# Patient Record
Sex: Female | Born: 1960 | Race: White | Hispanic: No | Marital: Married | Smoking: Former smoker
Health system: Southern US, Community
[De-identification: ages and names within clinical notes are randomized; demographics above are authoritative.]

## PROBLEM LIST (undated history)

## (undated) DIAGNOSIS — R7303 Prediabetes: Secondary | ICD-10-CM

## (undated) DIAGNOSIS — Z9889 Other specified postprocedural states: Secondary | ICD-10-CM

## (undated) DIAGNOSIS — R112 Nausea with vomiting, unspecified: Secondary | ICD-10-CM

## (undated) DIAGNOSIS — D649 Anemia, unspecified: Secondary | ICD-10-CM

## (undated) DIAGNOSIS — M1990 Unspecified osteoarthritis, unspecified site: Secondary | ICD-10-CM

## (undated) DIAGNOSIS — E785 Hyperlipidemia, unspecified: Secondary | ICD-10-CM

## (undated) DIAGNOSIS — T8859XA Other complications of anesthesia, initial encounter: Secondary | ICD-10-CM

## (undated) DIAGNOSIS — M5136 Other intervertebral disc degeneration, lumbar region: Secondary | ICD-10-CM

## (undated) DIAGNOSIS — T4145XA Adverse effect of unspecified anesthetic, initial encounter: Secondary | ICD-10-CM

## (undated) DIAGNOSIS — I10 Essential (primary) hypertension: Secondary | ICD-10-CM

## (undated) DIAGNOSIS — F419 Anxiety disorder, unspecified: Secondary | ICD-10-CM

## (undated) DIAGNOSIS — E039 Hypothyroidism, unspecified: Secondary | ICD-10-CM

## (undated) DIAGNOSIS — Z8489 Family history of other specified conditions: Secondary | ICD-10-CM

## (undated) DIAGNOSIS — R0789 Other chest pain: Secondary | ICD-10-CM

## (undated) DIAGNOSIS — R011 Cardiac murmur, unspecified: Secondary | ICD-10-CM

## (undated) DIAGNOSIS — R519 Headache, unspecified: Secondary | ICD-10-CM

## (undated) DIAGNOSIS — F32A Depression, unspecified: Secondary | ICD-10-CM

## (undated) DIAGNOSIS — I340 Nonrheumatic mitral (valve) insufficiency: Secondary | ICD-10-CM

## (undated) DIAGNOSIS — E669 Obesity, unspecified: Secondary | ICD-10-CM

## (undated) DIAGNOSIS — M19049 Primary osteoarthritis, unspecified hand: Secondary | ICD-10-CM

## (undated) DIAGNOSIS — E119 Type 2 diabetes mellitus without complications: Secondary | ICD-10-CM

## (undated) DIAGNOSIS — K219 Gastro-esophageal reflux disease without esophagitis: Secondary | ICD-10-CM

## (undated) DIAGNOSIS — G4733 Obstructive sleep apnea (adult) (pediatric): Secondary | ICD-10-CM

## (undated) DIAGNOSIS — M51369 Other intervertebral disc degeneration, lumbar region without mention of lumbar back pain or lower extremity pain: Secondary | ICD-10-CM

## (undated) DIAGNOSIS — M797 Fibromyalgia: Secondary | ICD-10-CM

## (undated) DIAGNOSIS — T7840XA Allergy, unspecified, initial encounter: Secondary | ICD-10-CM

## (undated) DIAGNOSIS — E559 Vitamin D deficiency, unspecified: Secondary | ICD-10-CM

## (undated) HISTORY — DX: Cardiac murmur, unspecified: R01.1

## (undated) HISTORY — DX: Unspecified osteoarthritis, unspecified site: M19.90

## (undated) HISTORY — DX: Hyperlipidemia, unspecified: E78.5

## (undated) HISTORY — DX: Other intervertebral disc degeneration, lumbar region: M51.36

## (undated) HISTORY — PX: JOINT REPLACEMENT: SHX530

## (undated) HISTORY — PX: REPLACEMENT TOTAL KNEE: SUR1224

## (undated) HISTORY — DX: Hypothyroidism, unspecified: E03.9

## (undated) HISTORY — PX: OTHER SURGICAL HISTORY: SHX169

## (undated) HISTORY — DX: Allergy, unspecified, initial encounter: T78.40XA

## (undated) HISTORY — DX: Fibromyalgia: M79.7

## (undated) HISTORY — DX: Gastro-esophageal reflux disease without esophagitis: K21.9

## (undated) HISTORY — DX: Other chest pain: R07.89

## (undated) HISTORY — DX: Nonrheumatic mitral (valve) insufficiency: I34.0

---

## 1982-12-04 HISTORY — PX: LAPAROSCOPY: SHX197

## 1998-07-01 ENCOUNTER — Ambulatory Visit (HOSPITAL_COMMUNITY): Admission: RE | Admit: 1998-07-01 | Discharge: 1998-07-01 | Payer: Self-pay | Admitting: *Deleted

## 2004-02-02 ENCOUNTER — Other Ambulatory Visit: Admission: RE | Admit: 2004-02-02 | Discharge: 2004-02-02 | Payer: Self-pay | Admitting: Obstetrics and Gynecology

## 2004-02-10 ENCOUNTER — Inpatient Hospital Stay (HOSPITAL_COMMUNITY): Admission: AD | Admit: 2004-02-10 | Discharge: 2004-02-10 | Payer: Self-pay | Admitting: *Deleted

## 2004-02-12 ENCOUNTER — Ambulatory Visit (HOSPITAL_COMMUNITY): Admission: RE | Admit: 2004-02-12 | Discharge: 2004-02-12 | Payer: Self-pay | Admitting: Obstetrics & Gynecology

## 2004-02-12 ENCOUNTER — Encounter (INDEPENDENT_AMBULATORY_CARE_PROVIDER_SITE_OTHER): Payer: Self-pay | Admitting: *Deleted

## 2005-12-04 HISTORY — PX: OTHER SURGICAL HISTORY: SHX169

## 2007-08-01 ENCOUNTER — Ambulatory Visit (HOSPITAL_COMMUNITY): Admission: RE | Admit: 2007-08-01 | Discharge: 2007-08-01 | Payer: Self-pay | Admitting: Obstetrics & Gynecology

## 2008-03-02 ENCOUNTER — Ambulatory Visit: Payer: Self-pay | Admitting: Family Medicine

## 2008-08-20 ENCOUNTER — Ambulatory Visit: Payer: Self-pay | Admitting: Family Medicine

## 2008-12-21 ENCOUNTER — Ambulatory Visit: Payer: Self-pay

## 2009-02-25 ENCOUNTER — Encounter: Payer: Self-pay | Admitting: Cardiology

## 2009-02-26 ENCOUNTER — Encounter: Payer: Self-pay | Admitting: Cardiovascular Disease

## 2009-03-16 ENCOUNTER — Ambulatory Visit: Payer: Self-pay | Admitting: Family Medicine

## 2009-03-23 ENCOUNTER — Ambulatory Visit: Payer: Self-pay | Admitting: Family Medicine

## 2009-06-03 HISTORY — PX: KNEE SURGERY: SHX244

## 2009-06-17 ENCOUNTER — Ambulatory Visit: Payer: Self-pay | Admitting: General Practice

## 2009-06-23 ENCOUNTER — Ambulatory Visit: Payer: Self-pay | Admitting: General Practice

## 2009-12-04 HISTORY — PX: COLONOSCOPY: SHX174

## 2009-12-31 ENCOUNTER — Ambulatory Visit: Payer: Self-pay | Admitting: Cardiology

## 2009-12-31 DIAGNOSIS — R079 Chest pain, unspecified: Secondary | ICD-10-CM | POA: Insufficient documentation

## 2009-12-31 DIAGNOSIS — E785 Hyperlipidemia, unspecified: Secondary | ICD-10-CM | POA: Insufficient documentation

## 2009-12-31 DIAGNOSIS — R0989 Other specified symptoms and signs involving the circulatory and respiratory systems: Secondary | ICD-10-CM | POA: Insufficient documentation

## 2009-12-31 DIAGNOSIS — R0609 Other forms of dyspnea: Secondary | ICD-10-CM | POA: Insufficient documentation

## 2009-12-31 DIAGNOSIS — R011 Cardiac murmur, unspecified: Secondary | ICD-10-CM | POA: Insufficient documentation

## 2010-01-03 ENCOUNTER — Ambulatory Visit: Payer: Self-pay | Admitting: Cardiology

## 2010-01-03 ENCOUNTER — Ambulatory Visit: Payer: Self-pay

## 2010-01-03 ENCOUNTER — Ambulatory Visit (HOSPITAL_COMMUNITY): Admission: RE | Admit: 2010-01-03 | Discharge: 2010-01-03 | Payer: Self-pay | Admitting: Cardiology

## 2010-01-03 ENCOUNTER — Encounter (HOSPITAL_COMMUNITY): Admission: RE | Admit: 2010-01-03 | Discharge: 2010-03-09 | Payer: Self-pay | Admitting: Cardiology

## 2010-01-03 ENCOUNTER — Encounter: Payer: Self-pay | Admitting: Cardiology

## 2010-01-07 ENCOUNTER — Ambulatory Visit: Payer: Self-pay | Admitting: Cardiovascular Disease

## 2010-01-10 ENCOUNTER — Encounter: Payer: Self-pay | Admitting: Cardiovascular Disease

## 2010-01-12 ENCOUNTER — Telehealth: Payer: Self-pay | Admitting: Cardiovascular Disease

## 2010-01-14 ENCOUNTER — Telehealth: Payer: Self-pay | Admitting: Cardiovascular Disease

## 2010-02-02 ENCOUNTER — Ambulatory Visit: Payer: Self-pay | Admitting: Emergency Medicine

## 2010-02-21 ENCOUNTER — Telehealth (INDEPENDENT_AMBULATORY_CARE_PROVIDER_SITE_OTHER): Payer: Self-pay | Admitting: *Deleted

## 2010-02-24 ENCOUNTER — Ambulatory Visit: Payer: Self-pay | Admitting: Emergency Medicine

## 2010-02-24 DIAGNOSIS — J309 Allergic rhinitis, unspecified: Secondary | ICD-10-CM | POA: Insufficient documentation

## 2010-03-17 ENCOUNTER — Ambulatory Visit: Payer: Self-pay | Admitting: Family Medicine

## 2010-08-01 ENCOUNTER — Ambulatory Visit: Payer: Self-pay | Admitting: Gastroenterology

## 2010-08-03 ENCOUNTER — Ambulatory Visit: Payer: Self-pay | Admitting: Urology

## 2010-08-03 LAB — PATHOLOGY REPORT

## 2010-12-04 DIAGNOSIS — M797 Fibromyalgia: Secondary | ICD-10-CM

## 2010-12-04 HISTORY — DX: Fibromyalgia: M79.7

## 2010-12-06 ENCOUNTER — Encounter: Payer: Self-pay | Admitting: Rheumatology

## 2010-12-24 ENCOUNTER — Encounter: Payer: Self-pay | Admitting: Neurology

## 2010-12-25 ENCOUNTER — Encounter: Payer: Self-pay | Admitting: Obstetrics and Gynecology

## 2011-01-01 LAB — CONVERTED CEMR LAB
AST: 19 units/L
Albumin: 4.2 g/dL
BUN: 15 mg/dL
Calcium: 9 mg/dL
Cholesterol: 165 mg/dL
GFR calc non Af Amer: >59 mL/min
Glucose, Bld: 83 mg/dL
Potassium: 4.7 meq/L
Sodium: 140 meq/L

## 2011-01-03 NOTE — Assessment & Plan Note (Signed)
Summary: allergies, ? asthma   Visit Type:  Follow-up Copy to:  Dr. Mariah Milling Primary Provider/Referring Provider:  Liane Comber FNP  CC:  Asthma follow-up with PFT.  Patient has called earlier in the week c/o chest pain after being around too much cigarette smoke.Marland Kitchen  History of Present Illness: 50 yo former smoker (30pk-yrs) with hx ? asthma dx yrs ago (has had PFT's, ? when or where) and allergic rhinitis. Also with hx GERD.   Referred by cardiology for some episodic SOB and CP that she believes has some relationship to reflux or possibly to fumes that she is exposed to at work. Started mid-Feb when she had CP and pressure, evolving neck pain. Associated with dyspnea. Prompted a cardiac eval, TTE, ECG, cardiac stress test that was reassuring. Has been treated with prednisone in the past, including with the most recent episode. Didn't seem to help this time, but has helped in the past. Has been on Advair before during allergy season. Also given xopenex. Now referred to evaluate possible asthma.   ROV 02/24/10 -- Returns for f/u. Has been having some mid-CP that can be associated with exertion. has had some problems with allergies, worse with the pollen this Spring.   Current Medications (verified): 1)  Levothroid 112 Mcg Tabs (Levothyroxine Sodium) .Marland Kitchen.. 1 By Mouth Once Daily 2)  Xopenex Hfa 45 Mcg/act Aero (Levalbuterol Tartrate) .... 2 Puffs in The Morning and 2 Puffs in The Evening As Needed 3)  Omeprazole 40 Mg Cpdr (Omeprazole) .Marland Kitchen.. 1 Tab By Mouth Daily 4)  Meloxicam 7.5 Mg Tabs (Meloxicam) .... As Needed  Allergies (verified): No Known Drug Allergies  Vital Signs:  Patient profile:   50 year old female Height:      67 inches (170.18 cm) Weight:      208 pounds (94.55 kg) BMI:     32.70 O2 Sat:      95 % on Room air Temp:     97.4 degrees F (36.33 degrees C) oral Pulse rate:   99 / minute BP sitting:   118 / 72  (left arm) Cuff size:   regular  Vitals Entered By: Michel Bickers CMA  (February 24, 2010 1:47 PM)  O2 Sat at Rest %:  95 O2 Flow:  Room air  Physical Exam  General:  normal appearance and healthy appearing.   Head:  normocephalic and atraumatic Eyes:  conjunctiva and sclera clear Nose:  no deformity, discharge, inflammation, or lesions Mouth:  no deformity or lesions Neck:  no masses, thyromegaly, or abnormal cervical nodes Lungs:  clear bilaterally to auscultation and percussion Heart:  regular rate and rhythm, S1, S2 without murmurs, rubs, gallops, or clicks Abdomen:  not examined Neurologic:  non-focal Psych:  alert and cooperative; normal mood and affect; normal attention span and concentration   Pulmonary Function Test Date: 02/24/2010 Height (in.): 67 Gender: Female  Pre-Spirometry FVC    Value: 3.82 L/min   Pred: 3.68 L/min     % Pred: 104 % FEV1    Value: 2.77 L     Pred: 2.81 L     % Pred: 99 % FEV1/FVC  Value: 73 %     Pred: 75 %    FEF 25-75  Value: 2.07 L/min   Pred: 3.10 L/min     % Pred: 67 %  Post-Spirometry FVC    Value: 3.91 L/min   Pred: 3.68 L/min     % Pred: 106 % FEV1    Value: 2.91 L  Pred: 2.81 L     % Pred: 104 % FEV1/FVC  Value: 74 %     Pred: 75 %    FEF 25-75  Value: 2.45 L/min   Pred: 3.10 L/min     % Pred: 79 %  Lung Volumes TLC    Value: 5.94 L   % Pred: 106 % RV    Value: 2.12 L   % Pred: 107 % DLCO    Value: 22.4 %   % Pred: 85 % DLCO/VA  Value: 5.23 %   % Pred: 133 %  Comments: Normal airflows, no response to BD. Normal volumes. Normal diffusion capacity. RSB  Impression & Recommendations:  Problem # 1:  ? of ASTHMA (ICD-493.90) Normal PFT. Could consider methacholine challenge if we decide to pursue further. Will defer at this time  Problem # 2:  ALLERGIC RHINITIS (ICD-477.9)  Will ramp up allergy regime in prep for Spring: - NSWs - veramyst - loratadine Her updated medication list for this problem includes:    Veramyst 27.5 Mcg/spray Susp (Fluticasone furoate) .Marland Kitchen... 2 sprays each nostril once  daily    Loratadine 10 Mg Tabs (Loratadine) .Marland Kitchen... 1 by mouth once daily  Orders: Est. Patient Level IV (69629)  Medications Added to Medication List This Visit: 1)  Veramyst 27.5 Mcg/spray Susp (Fluticasone furoate) .... 2 sprays each nostril once daily 2)  Loratadine 10 Mg Tabs (Loratadine) .Marland Kitchen.. 1 by mouth once daily  Patient Instructions: 1)  Start loratadine 10mg  by mouth once daily  2)  Start Veramyst 2 sprays each nostril once daily  3)  Try doing nasal saline washes every day.  4)  We will consider performing a methacholine challenge test at some point in the future if your breathing changes.  5)  Continue you omeprazole two times a day  6)  Follow up with Dr Delton Coombes in 6 weeks to assess your progress.  Prescriptions: LORATADINE 10 MG TABS (LORATADINE) 1 by mouth once daily  #30 x 5   Entered and Authorized by:   Leslye Peer MD   Signed by:   Leslye Peer MD on 02/24/2010   Method used:   Electronically to        [facility]* (retail)       [address]       [place], [place]  [address]       Ph: [phone]       Fax: [phone]   RxID:   [id number] VERAMYST 27.5 MCG/SPRAY SUSP (FLUTICASONE FUROATE) 2 sprays each nostril once daily  #1 x 5   Entered and Authorized by:   Leslye Peer MD   Signed by:   Leslye Peer MD on 02/24/2010   Method used:   Electronically to        [facility]* (retail)       [address]       [place], [place]  [address]       Ph: [phone]       Fax: [phone]   RxID:   [id number]

## 2011-01-03 NOTE — Assessment & Plan Note (Signed)
Summary: EC6/AMD   Visit Type:  EC6 Referring Provider:  Liane Comber Primary Provider:  Liane Comber FNP  CC:  SOB and back pain from breathing.  History of Present Illness: 50 yo with history of moderate mitral regurgitation presents for evaluation of chest pain and dyspnea.   Ms. states that her shortness of breath has continued. She feels tight in the chest, feels like she has to cough something up. She states that it feels very similar to when she injured her lungs and she inhaled something on the job. At that time she needed steroids to improve her shortness of breath. She has tried an albuterol inhaler in the past though states that it made her feel very jittery. She ended up throwing away.  Stress test showed no significant ischemia. Echocardiogram showed mild to moderate mitral regurgitation with normal systolic function. Otherwise normal echocardiogram.     Current Medications (verified): 1)  Omeprazole 40 Mg Cpdr (Omeprazole) .Marland Kitchen.. 1 By Mouth Once Daily 2)  Levothroid 112 Mcg Tabs (Levothyroxine Sodium) .Marland Kitchen.. 1 By Mouth Once Daily  Allergies (verified): No Known Drug Allergies  Past History:  Past Medical History: Last updated: 01/06/10 1. Asthma: mild, associated with seasonal allergies 2. G E R D 3. Hyperlipidemia 4. Hypothyroidism post radioactive iodine  5. Knee surgery 7/10 6. Moderate mitral regurgitation (probably MVP).  Echo (3/10) at [facility] cardiology with normal EF, moderate MR.  7. metrorrhagia 8. 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  Alcohol Use - yes -  social (rare) Regular Exercise - no Drug Use - no  Risk Factors: Alcohol Use: <1 (01-06-10) Caffeine Use: 3 cups a day (2010-01-06) Exercise: no (01-06-10)  Risk Factors: Smoking Status: quit (2010-01-06)  Review of Systems       The patient complains of dyspnea on exertion and prolonged cough.  The patient denies anorexia, fever, weight loss, weight gain, vision loss, decreased hearing, hoarseness, chest pain, syncope, peripheral edema, headaches, hemoptysis, abdominal pain, melena, hematochezia, severe indigestion/heartburn, hematuria, incontinence, genital sores, muscle weakness, suspicious skin lesions, transient blindness, difficulty walking, depression, unusual weight change, abnormal bleeding, enlarged lymph nodes, angioedema, breast masses, and testicular masses.    Vital Signs:  Patient profile:   50 year old female Height:      67 inches Weight:      204.50 pounds BMI:     32.15 Pulse rate:   64 / minute Pulse rhythm:   regular BP sitting:   120 / 80  (left arm) Cuff size:   regular  Vitals Entered By: Mercer Pod (January 07, 2010 10:04 AM)  Physical Exam  General:  well-appearing young woman in no apparent distress. Her HEENT exam is benign, oropharynx is clear, neck is supple with no JVP or carotid bruits, heart sounds are regular with normal S1 and S2 with 1-2/6 systolic ejection murmur heard at the axilla, lungs are clear to auscultation with no wheezes or rales, abdominal exam is benign, she has no significant lower extremity edema, neurological exam is grossly nonfocal and skin is warm and dry.  Impression & Recommendations:  Problem # 1:  DYSPNEA ON EXERTION (ICD-786.09) Etiology of her shortness of breath is likely due to a pulmonary cause. Her stress test and echocardiogram are essentially normal apart from mild to moderate mitral  regurgitation which is chronic. She does comment on having a history of asthma, a 20+ year smoking history. She has had this in the past for shortness of breath. She has been unable to tolerate albuterol q.2 h. history feelings.  We have given her a prescription for Xopenex inhaler, if this does not improve her shortness of breath we have also given her a prescription for a prednisone Dosepak. I also suggested that she double her omeprazole and take it on a consistent basis as she states having some GERD type symptoms. I've asked her to contact either myself or bronchogenic practice if her shortness of breath continues.  Problem # 2:  HYPERLIPIDEMIA (ICD-272.4) we'll try to obtain the most recent cholesterol panel from [facility]. She states that she is due to followup with Dr. Katrinka Blazing in April of this year.  Problem # 3:  CHEST PAIN UNSPECIFIED (ICD-786.50) I suspect her chest pain is 2 to pulmonary etiology, with possible inflammation. I hope that this improves on prednisone and have  asked her to contact us if she has no improvement.  Patient Instructions: 1)  Your physician recommends that you schedule a follow-up appointment in: 6 months 2)  Your physician has recommended you make the following change in your medication: xopenex inhaler 2 puffs in the morning and 2 puffs in the evening, prednisone dose pack Prescriptions: OMEPRAZOLE 40 MG CPDR (OMEPRAZOLE) 1 by mouth twice daily  #60 x 6   Entered by:   Charlena Cross, RN, BSN   Authorized by:   Dossie Arbour MD   Signed by:   Charlena Cross, RN, BSN on 01/07/2010   Method used:   Electronically to        [facility]* (retail)       [address]       [place], [place]  [address]       Ph: [phone]       Fax: [phone]   RxID:   [id number] PREDNISONE 10 MG TABS (PREDNISONE) prednisone taper: 60mg  x3 days, 40 mg x3 day, 20 mg x 3days, 10mg  x3days then stop.  #1 x 0   Entered by:    Charlena Cross, RN, BSN   Authorized by:   Dossie Arbour MD   Signed by:   Charlena Cross, RN, BSN on 01/07/2010   Method used:   Electronically to        [facility]* (retail)       [address]       [place], [place]  [address]       Ph: [phone]       Fax: [phone]   RxID:   [id number] OMEPRAZOLE 40 MG CPDR (OMEPRAZOLE) 1 tab by mouth daily  #30 x 6   Entered by:   Charlena Cross, RN, BSN   Authorized by:   Dossie Arbour MD   Signed by:   Charlena Cross, RN, BSN on 01/07/2010   Method used:   Electronically to        [facility]* (retail)       [address] [facility]       [place], [place]  [address]       Ph: [phone]       Fax: [phone]   RxID:   [id number] QIWLNLG HFA 45 MCG/ACT AERO (LEVALBUTEROL TARTRATE) 2 puffs in the morning and 2 puffs in the evening  #1 x 6   Entered by:   Charlena Cross, RN, BSN   Authorized by:   Dossie Arbour MD   Signed by:   Charlena Cross, RN, BSN on 01/07/2010   Method used:   Electronically to        [facility]* (retail)       [address]       [place], [place]  [address]       Ph: [phone]       Fax: [phone]   RxID:   [id number]   Appended Document: EC6/AMD    Clinical Lists Changes  Observations: Added new observation of TSH: 0.094 microintl units/mL (03/29/2009 10:47) Added new observation of TRIGLYCERIDE: 105 mg/dL (87/86/7672 09:47) Added new observation of HDL: 37 mg/dL (09/62/8366 29:47) Added new observation of LDL (CALCUL): 107 mg/dL (65/46/5035 46:56) Added new observation of CHOLESTEROL: 165 mg/dL (81/27/5170 01:74) Added new observation of T4, FREE: 1.98 ng/dL (94/49/6759 16:38) Added new observation of ALBUMIN: 4.2 g/dL (46/65/9935 70:17) Added new observation of PROTEIN, TOT: 6.7 g/dL (79/39/0300 92:33) Added new observation of CALCIUM: 9.0 mg/dL (00/76/2263 33:54) Added new observation of BILI  TOTAL: 0.3 mg/dL (56/25/6389 37:34) Added new observation of SGPT (ALT): 22 units/L (02/26/2009 10:48) Added new observation of SGOT (AST): 19 units/L (02/26/2009 10:48) Added new observation of CO2 PLSM/SER: 23 meq/L (02/26/2009 10:48) Added new observation of CL SERUM: 101 meq/L (02/26/2009 10:48) Added new observation of K SERUM: 4.7 meq/L (02/26/2009 10:48) Added new observation of NA: 140 meq/L (02/26/2009 10:48) Added new observation of CREATININE: 0.73 mg/dL (28/76/8115 72:62) Added new observation of BUN: 15 mg/dL (03/55/9741 63:84) Added new observation of BG RANDOM: 83 mg/dL (53/64/6803 21:22) Added new observation of BASOPHIL %: 0 % (02/26/2009 10:47) Added new observation of % EOS AUTO: 3 % (02/26/2009 10:47) Added new observation of MONOCYTE %: 8 % (02/26/2009 10:47) Added new observation of LYMPH %: 21 % (02/26/2009 10:47) Added new observation of PMN %: 68 % (02/26/2009 10:47) Added new observation of RDW: 13.6 % (02/26/2009 10:47) Added new observation of MCV: 89 fL (02/26/2009 10:47) Added new observation of PLATELETK/UL: 278 K/uL (02/26/2009 10:47) Added new observation of RBC M/UL: 4.63 M/uL (02/26/2009 10:47) Added new observation of HCT: 41.4 % (02/26/2009 10:47) Added new observation of HGB: 13.6 g/dL (48/25/0037 04:88) Added new observation of WBC COUNT: 5.8 10*3/microliter (02/26/2009 10:47)       -  Date:  03/29/2009    Cholesterol: 165    LDL-calculated: 107    HDL: 37    Triglycerides: 891    TSH: 0.094  Date:  02/26/2009    WBC: 5.8    HGB: 13.6    HCT: 41.4    RBC: 4.63    PLT: 278    MCV: 89    RDW: 13.6    Neutrophil: 68    Lymphs: 21    Monos: 8    Eos: 3    Basophil: 0    BG Random: 83    BUN: 15    Creatinine: 0.73    Sodium: 140    Potassium: 4.7    Chloride: 101    CO2 Total: 23    SGOT (AST): 19  SGPT (ALT): 22    T. Bilirubin: 0.3    Calcium: 9.0    Total Protein: 6.7    Albumin: 4.2    T4-Free: 1.98

## 2011-01-03 NOTE — Miscellaneous (Signed)
  Clinical Lists Changes  Observations: Added new observation of TRIGLYCERIDE: 105 mg/dL (13/07/6577 46:96) Added new observation of HDL: 37 mg/dL (29/52/8413 24:40) Added new observation of LDL: 107 mg/dL (09/30/2535 64:40) Added new observation of GFR AA: >59 mL/min/1.90m2 (02/26/2009 11:56) Added new observation of GFR: >59 mL/min (02/26/2009 11:56) Added new observation of ALBUMIN: 4.2 g/dL (34/74/2595 63:87) Added new observation of PROTEIN, TOT: 6.7 g/dL (56/43/3295 18:84) Added new observation of CHOLESTEROL: 165 mg/dL (16/60/6301 60:10) Added new observation of CALCIUM: 9.0 mg/dL (93/23/5573 22:02) Added new observation of ALK PHOS: 66 units/L (02/26/2009 11:56) Added new observation of BILI TOTAL: 0.3 mg/dL (54/27/0623 76:28) Added new observation of SGPT (ALT): 22 units/L (02/26/2009 11:56) Added new observation of SGOT (AST): 19 units/L (02/26/2009 11:56) Added new observation of CO2 PLSM/SER: 23 meq/L (02/26/2009 11:56) Added new observation of CL SERUM: 101 meq/L (02/26/2009 11:56) Added new observation of K SERUM: 4.7 meq/L (02/26/2009 11:56) Added new observation of NA: 140 meq/L (02/26/2009 11:56) Added new observation of CREATININE: 0.73 mg/dL (31/51/7616 07:37) Added new observation of BUN: 15 mg/dL (10/62/6948 54:62) Added new observation of BG RANDOM: 83 mg/dL (70/35/0093 81:82)

## 2011-01-03 NOTE — Progress Notes (Signed)
Summary: SOB not better  Phone Note Call from Patient   Summary of Call: called to state that SOB is no better on inhaler and prednisone.  Per Dr. Mariah Milling- quick taper off prednisone and try lasix 20mg  daily for 1 week to see if symtpoms improve. Instructed pt on need to increase potassium rich foods.  Initial call taken by: Charlena Cross, RN, BSN,  January 12, 2010 2:31 PM    New/Updated Medications: FUROSEMIDE 20 MG TABS (FUROSEMIDE) Take one tablet by mouth daily for 1 week Prescriptions: FUROSEMIDE 20 MG TABS (FUROSEMIDE) Take one tablet by mouth daily for 1 week  #30 x 0   Entered by:   Charlena Cross, RN, BSN   Authorized by:   Dossie Arbour MD   Signed by:   Charlena Cross, RN, BSN on 01/12/2010   Method used:   Electronically to        [facility]* (retail)       [address]       [place], [place]  [address]       Ph: [phone]       Fax: [phone]   RxID:   [id number]

## 2011-01-03 NOTE — Progress Notes (Signed)
Summary: pain in left breast when exhaling  Phone Note Call from Patient   Caller: Patient Call For: byrum Summary of Call: pt c/o "stabbing pain in ribs/ breast area L side x 1 day; painful when exhaling per pt. says she was exposed to some smokers inside a bldg sat night. pt says this is not an emergency situation- she is at work. has appt for pft thurs. please advise. cell [phone] Initial call taken by: Tivis Ringer, CNA,  February 21, 2010 8:43 AM  Follow-up for Phone Call        The patient c/o a stabbing pain x1 day in her rib area but did not feel she needed to come in for OV with one of our providers. She is sch to come for OV w/ RB and PFT on Thurs. 3/24/20111. She will call if she wants OV before then.Michel Bickers CMA  February 21, 2010 9:26 AM

## 2011-01-03 NOTE — Assessment & Plan Note (Signed)
Summary: Cardiology Nuclear Study  Nuclear Med Background Indications for Stress Test: Evaluation for Ischemia   History: Asthma, CT/MRI, Echo, GXT  History Comments: 15-20 Yrs. ago GXT: Ok per patient. 3/10 Echo: NL EF,Moderate MR([facility]).  Symptoms: Chest Pain, DOE  Symptoms Comments: 12/31/09 (-) Troponin. Recent (R) calf pain with (-) PE per CT.   Nuclear Pre-Procedure Cardiac Risk Factors: Family History - CAD, History of Smoking, Lipids Caffeine/Decaff Intake: None NPO After: 7:00 PM Lungs: clear IV 0.9% NS with Angio Cath: 22g     IV Site: (R) AC IV Started by: Irean Hong RN Chest Size (in) 38     Cup Size B     Height (in): 67 Weight (lb): 201 BMI: 31.59  Nuclear Med Study 1 or 2 day study:  1 day     Stress Test Type:  Stress Reading MD:  Willa Rough, MD     Referring MD:  D.Mclean Resting Radionuclide:  Technetium 30m Tetrofosmin     Resting Radionuclide Dose:  11.0 mCi  Stress Radionuclide:  Technetium 60m Tetrofosmin     Stress Radionuclide Dose:  33.0 mCi   Stress Protocol Exercise Time (min):  7:00 min     Max HR:  155 bpm     Predicted Max HR:  171 bpm  Max Systolic BP: 175 mm Hg     Percent Max HR:  90.64 %     METS: 8.7 Rate Pressure Product:  [address]    Stress Test Technologist:  Milana Na EMT-P     Nuclear Technologist:  Burna Mortimer Deal RT-N  Rest Procedure  Myocardial perfusion imaging was performed at rest 45 minutes following the intravenous administration of Myoview Technetium 65m Tetrofosmin.  Stress Procedure  The patient exercised for 7:00. The patient stopped due to fatigue and denied any chest pain.  There were non specific ST-T wave changes and a rare pvc.  Myoview was injected at peak exercise and myocardial perfusion imaging was performed after a brief delay.  QPS Raw Data Images:  Normal; no motion artifact; normal heart/lung ratio. Stress Images:  There is normal uptake in all areas. Rest Images:  Normal  homogeneous uptake in all areas of the myocardium. Subtraction (SDS):  No evidence of ischemia. Transient Ischemic Dilatation:  .98  (Normal <1.22)  Lung/Heart Ratio:  .28  (Normal <0.45)  Quantitative Gated Spect Images QGS EDV:  96 ml QGS ESV:  26 ml QGS EF:  73 % QGS cine images:  Normal motion  Findings Normal nuclear study      Overall Impression  Exercise Capacity: Fair exercise capacity. BP Response: Normal blood pressure response. Clinical Symptoms: No chest pain ECG Impression: No significant ST segment change suggestive of ischemia. Overall Impression: Normal stress nuclear study.  Appended Document: Cardiology Nuclear Study normal study  Appended Document: Cardiology Nuclear Study pt aware. Charlena Cross RN BSN

## 2011-01-03 NOTE — Miscellaneous (Signed)
Summary: Orders Update pft charges  Clinical Lists Changes  Orders: Added new Service order of Carbon Monoxide diffusing w/capacity (94720) - Signed Added new Service order of Lung Volumes (94240) - Signed Added new Service order of Spirometry (Pre & Post) (94060) - Signed 

## 2011-01-03 NOTE — Assessment & Plan Note (Signed)
Summary: NP6/AMD   Visit Type:  New Patient Referring Provider:  Liane Comber Primary Provider:  Liane Comber FNP  CC:  chest pain, wheezing, and difficulty breathing. Marland Kitchen  History of Present Illness: 50 yo with history of moderate mitral regurgitation presents for evaluation of chest pain and dyspnea.  Patient was in her usual state of health until yesterday morning when she developed a dull substernal chest pressure radiating to her neck.  It was not pleuritic.  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  It lasted for about 45 minutes to an hour.  In the early afternoon, she began to note significant shortness of breath after walking about 10 feet.  She was out of breath with any exertion.  The shortness of breath is still present now but she has had no further chest pain.  No PND or orthopnea last night.  No shortness of breath at rest.  No definite wheezing.  No cough, fever, or congestion.    She had knee surgery this past summer.  Yesterday, she developed a pain in the posterior lower leg just below the knee that she had surgery on.  This has resolved.   Oxygen saturation was 99% on room air at [facility] this morning.   ECG: NSR, normal  Labs: CK 128, troponin < 0.02, BNP 33  Chest CT angiogram: No PE.  No other intrathoracic abnormality  Preventive Screening-Counseling & Management  Alcohol-Tobacco     Alcohol drinks/day: <1     Smoking Status: quit     Year Quit: 1995  Caffeine-Diet-Exercise     Caffeine use/day: 3 cups a day     Does Patient Exercise: no      Drug Use:  no.    Current Medications (verified): 1)  Omeprazole 40 Mg Cpdr (Omeprazole) .Marland Kitchen.. 1 By Mouth Once Daily 2)  Levothroid 112 Mcg Tabs (Levothyroxine Sodium) .Marland Kitchen.. 1 By Mouth Once Daily  Allergies (verified): No Known Drug Allergies  Past History:  Past Medical History: 1. Asthma: mild, associated with seasonal allergies 2. G E R D 3. Hyperlipidemia 4. Hypothyroidism  post radioactive iodine  5. Knee surgery 7/10 6. Moderate mitral regurgitation (probably MVP).  Echo (3/10) at [facility] cardiology with normal EF, moderate MR.  7. metrorrhagia 8. 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  Alcohol Use - yes -  social (rare) Regular Exercise - no Drug Use - no Alcohol drinks/day:  <1 Smoking Status:  quit Caffeine use/day:  3 cups a day Does Patient Exercise:  no Drug Use:  no  Review of Systems       All systems reviewed and negative except as per HPI.   Vital Signs:  Patient profile:   50 year old female Height:      67 inches Weight:      204 pounds BMI:     32.07 Pulse rate:   66 / minute Pulse rhythm:   regular BP sitting:   118 / 80  (left arm) Cuff size:   regular  Vitals Entered By: Mercer Pod (December 31, 2009 10:06 AM)  Physical Exam  General:  Well developed, well nourished, in no acute distress. Head:  normocephalic and atraumatic Nose:  no deformity, discharge, inflammation, or lesions Mouth:  Teeth, gums and palate normal. Oral mucosa normal. Neck:  Neck supple, no JVD. No masses, thyromegaly or abnormal cervical nodes. Lungs:  Clear bilaterally to auscultation and percussion.  No wheezes. Heart:  Non-displaced PMI, chest non-tender; regular rate and rhythm, S1, S2 without rubs or gallops. Possible soft systolic click.  1/6 systolic murmur at the apex.  Carotid upstroke normal, no bruit. Pedals normal pulses. No edema, no varicosities. Abdomen:  Bowel sounds positive; abdomen soft and non-tender  without masses, organomegaly, or hernias noted. No hepatosplenomegaly. Msk:  Back normal, normal gait. Muscle strength and tone normal. Extremities:  No clubbing or cyanosis. Neurologic:  Alert and oriented x 3. Skin:  Intact without lesions or rashes. Psych:  Normal affect.   Impression & Recommendations:  Problem # 1:  CHEST PAIN UNSPECIFIED (ICD-786.50) Patient had an episode of about 1 hour of severe chest pain yesterday.  Her main coronary risk factor is family history of premature CAD (mother).  She has had significant dyspnea on exertion since that time.  Given her complaint of right calf pain, I was concerned enough for PE to order a CT angiogram of the chest.  This showed no PE and no other intrathoracic abnormality.  Her cardiac enzymes were negative today.  I am not sure of the etiology of her symptoms as there is nothing abnormal on exam or with her vitals.  I will have her do an ETT-myoview on Monday.  If symptoms worsen over the weekend she should go to the ER.   Problem # 2:  DYSPNEA ON EXERTION (ICD-786.09) I am not sure of the etiology of this.  She is not volume overloaded on exam.  ECG is normal.  BNP is normal.  She has a history of mitral regurgitation (moderate) on prior echo but she does not have a loud murmur suggestive of flail leaflet, etc.  I will get an echocardiogram to assess for worsening of MR.  Ischemic diastolic dysfunction is a possibitity though she does not have much in the way of risk factors.  As above, will get an ETT-myoview.   We will see her again next week.   Other Orders: T-BNP  (B Natriuretic Peptide) [phone]) T-CK MB Fract ([id number]) T- * Misc. Laboratory test [phone]) Radiology Referral (Radiology) Echocardiogram (Echo) Nuclear Stress Test (Nuc Stress Test)  Patient Instructions: 1)  Your physician recommends that you schedule a follow-up appointment in: next week with Dr. Mariah Milling 2)  Your physician has requested that you have an  echocardiogram.  Echocardiography is a painless test that uses sound waves to create images of your heart. It provides your doctor with information about the size and shape of your heart and how well your heart's chambers and valves are working.  This procedure takes approximately one hour. There are no restrictions for this procedure. 3)  Your physician has requested that you have an exercise stress myoview.  For further information please visit [email or web address]  Please follow instruction sheet, as given.

## 2011-01-03 NOTE — Progress Notes (Signed)
Summary: [facility]   Imported By: Harlon Flor 12/31/2009 12:47:20  _____________________________________________________________________  External Attachment:    Type:   Image     Comment:   External Document

## 2011-01-03 NOTE — Progress Notes (Signed)
Summary: PHI  PHI   Imported By: Harlon Flor 12/31/2009 12:48:20  _____________________________________________________________________  External Attachment:    Type:   Image     Comment:   External Document

## 2011-01-03 NOTE — Progress Notes (Signed)
Summary: [facility]   Imported By: Harlon Flor 12/31/2009 12:48:04  _____________________________________________________________________  External Attachment:    Type:   Image     Comment:   External Document

## 2011-01-03 NOTE — Assessment & Plan Note (Signed)
Summary: dyspnea, ? asthma   Visit Type:  Initial Consult Copy to:  Dr. Mariah Milling Primary Provider/Referring Provider:  Liane Comber FNP  CC:  PUlmonary Consult for SOB.  states she is having SOB at rest and with activity-onest x3wks.  .  History of Present Illness: 50 yo former smoker (30pk-yrs) with hx ? asthma dx yrs ago (has had PFT's, ? when or where) and allergic rhinitis. Also with hx GERD.   Referred by cardiology for some episodic SOB and CP that she believes has some relationship to reflux or possibly to fumes that she is exposed to at work. Started mid-Feb when she had CP and pressure, evolving neck pain. Associated with dyspnea. Prompted a cardiac eval, TTE, ECG, cardiac stress test that was reassuring. Has been treated with prednisone in the past, including with the most recent episode. Didn't seem to help this time, but has helped in the past. Has been on Advair before during allergy season. Also given xopenex. Now referred to evaluate possible asthma.   Current Medications (verified): 1)  Levothroid 112 Mcg Tabs (Levothyroxine Sodium) .Marland Kitchen.. 1 By Mouth Once Daily 2)  Xopenex Hfa 45 Mcg/act Aero (Levalbuterol Tartrate) .... 2 Puffs in The Morning and 2 Puffs in The Evening As Needed 3)  Omeprazole 40 Mg Cpdr (Omeprazole) .Marland Kitchen.. 1 Tab By Mouth Daily 4)  Meloxicam 7.5 Mg Tabs (Meloxicam) .... 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  2ppd x 53yrs Alcohol Use - yes -  social (rare) Regular Exercise - no Drug Use - no  Review of Systems       The patient complains of shortness of breath with activity, shortness of breath at rest, chest pain, acid heartburn, and indigestion.  The patient denies productive cough, non-productive cough, coughing up blood, irregular heartbeats, loss of appetite, weight change, abdominal pain, difficulty swallowing, sore throat, tooth/dental  problems, headaches, nasal congestion/difficulty breathing through nose, sneezing, itching, ear ache, anxiety, depression, hand/feet swelling, joint stiffness or pain, rash, change in color of mucus, and fever.    Vital Signs:  Patient profile:   50 year old female Height:      67 inches Weight:      207.25 pounds BMI:     32.58 O2 Sat:      98 % on Room air Temp:     98.2 degrees F oral Pulse rate:   70 / minute BP sitting:   134 / 80  (left arm) Cuff size:   regular  Vitals Entered By: Gweneth Dimitri RN (February 02, 2010 2:25 PM)  O2 Flow:  Room air CC: PUlmonary Consult for SOB.  states she is having SOB at rest and with activity-onest x3wks.   Comments Medications reviewed with patient Daytime contact number verified with patient. Gweneth Dimitri RN  February 02, 2010 2:25 PM    Physical Exam  General:  normal appearance and healthy appearing.   Head:  normocephalic and atraumatic Eyes:  conjunctiva and sclera clear   Echocardiogram  Procedure date:  01/03/2010  Findings:          Study Conclusions            - Left ventricle: The cavity size was normal. Wall thickness was       normal. The estimated ejection fraction was 60%. Wall motion was       normal; there were no regional wall  motion abnormalities.     - Aortic valve: Sclerosis without stenosis.     - Mitral valve: Mild thickening of the tips of the mitral leaflets.       There is mild/moderate MR.     - Right ventricle: The cavity size was mildly dilated. Systolic       function was mildly reduced.     Transthoracic echocardiography. M-mode, complete 2D, spectral     Doppler, and color Doppler. Height: Height: 170.2cm. Height: 67in.     Weight: Weight: 92.5kg. Weight: 203.6lb. Body mass index: BMI:     32kg/m 2. Body surface area: BSA: 2.54m 2. Blood pressure: 127/69.     Patient status: Outpatient. Location: [facility]  CT of Chest  Procedure date:  12/31/2009  Findings:      No PE. No infiltrates  or parenchymal disease  Impression & Recommendations:  Problem # 1:  ASTHMA (ICD-493.90) Episodic dyspnea. I suspect this is asthma, being exacerbated by GERD and also possible exposures to fumes at work.  - full PFTs to confirm AFL - will schedule maintanance regimen once AFL confirmed - will need to manage allergies in the Spring - consider ramping up GER Dregimen if asthman proves difficult to control.   Medications Added to Medication List This Visit: 1)  Xopenex Hfa 45 Mcg/act Aero (Levalbuterol tartrate) .... 2 puffs in the morning and 2 puffs in the evening as needed 2)  Meloxicam 7.5 Mg Tabs (Meloxicam) .... As needed  Other Orders: Consultation Level IV (84166)  Patient Instructions: 1)  We will perform full PFT's on the same day as your return visit 2)  Follow up with Dr Delton Coombes next available appointment 3)  We will discuss starting inhaled medications after we review your PFTs   Immunization History:  Influenza Immunization History:    Influenza:  historical (08/04/2009)

## 2011-01-03 NOTE — Progress Notes (Signed)
Summary: SOB  Phone Note Call from Patient   Summary of Call: pt called to state that SOB is no better on lasix.  pt feels like she may need pulmonology referral at this pt. Initial call taken by: Charlena Cross, RN, BSN,  January 14, 2010 2:20 PM    Needs pulmonary referral. Irving Copas?   Appended Document: SOB pt has appt set up with [facility] for 3/2 @ 2:30 with Dr. Delton Coombes. Charlena Cross RN BSN   Appended Document: Orders Update    Clinical Lists Changes  Orders: Added new Referral order of Pulmonary Referral (Pulmonary) - Signed

## 2011-01-04 ENCOUNTER — Encounter: Payer: Self-pay | Admitting: Rheumatology

## 2011-03-24 ENCOUNTER — Ambulatory Visit: Payer: Self-pay

## 2011-04-05 ENCOUNTER — Ambulatory Visit: Payer: Self-pay | Admitting: Unknown Physician Specialty

## 2011-04-13 ENCOUNTER — Ambulatory Visit: Payer: Self-pay | Admitting: Unknown Physician Specialty

## 2011-04-18 NOTE — Op Note (Signed)
NAMEMARLETA, [name] NO.:  [id number]   MEDICAL RECORD NO.:  [id number]          PATIENT TYPE:  AMB   LOCATION:  [facility]C                           FACILITY:  [facility]   PHYSICIAN:  Genia Del, M.D.DATE OF BIRTH:  [birth year]   DATE OF PROCEDURE:  08/01/2007  DATE OF DISCHARGE:                               OPERATIVE REPORT   PREOPERATIVE DIAGNOSES:  1. Menorrhagia.  2. Desire for sterilization.   POSTOPERATIVE DIAGNOSES:  1. Menorrhagia.  2. Desire for sterilization.  3. Mild left adnexal adhesions.   PROCEDURES:  1. Endometrial ablation with NovaSure.  2. Laparoscopy bilateral tubal sterilization by cauterization.   SURGEON:  Genia Del, MD   ANESTHESIOLOGIST:  Raul Del, MD   PROCEDURE:  Under general anesthesia with endotracheal intubation, the  patient is in lithotomy position.  She is prepped with Betadine on the  abdominal, suprapubic, vulvar and vaginal areas and draped as usual.  The bladder is catheterized.  We do a vaginal exam revealing a  retroverted uterus, normal volume and mobile, no adnexal mass.  We  insert the speculum in the vagina.  The anterior lip of the cervix is  grasped with a tenaculum.  The cervix measures 4 cm.  The intrauterine  cavity measures 4 cm.  We dilate the cervix at a Hegar #25 without  difficulty.  We then insert the NovaSure instrument in the intrauterine  cavity.  We lock it due to motions to have the intrauterine width.  We  then verify the integrity of the cavity, which is adequate.  We then  proceed with the endometrial ablation.  We use a power of 55 for 2  minutes.  The instrument is then removed and we go to abdominal time.  We reposition the patient.  We infiltrate the subcutaneous tissue with  Marcaine 0.25% plain at the infraumbilical area.  We make an incision  over 1.5 cm with the scalpel at that level.  We open the aponeurosis  with Mayo scissors under direct vision and we open the  parietal  peritoneum bluntly with a finger.  We put a pursestring stitch of Vicryl  0 at the aponeurosis.  We insert the Hasson and the laparoscopic at that  level.  We explore the abdominopelvic cavities.  The liver is normal to  inspection.  No pathology is seen in the abdomen.  We visualize a normal  uterus, the right and left tubes are normal in appearance and size, the  right and left ovaries are normal in appearance and size.  Mild  adhesions are present in the left adnexa.  We then use the bipolar clamp  to cauterize the right tube and then the left tube at about 2 cm from  the cornua over 2 cm in with and including the mesosalpinx on each side.  Hemostasis is adequate at all levels.  Pictures were taken before and  after the procedure.  We remove all instruments, evacuate the  CO2.  We attach the pursestring suture at the aponeurosis.  We close the  skin by a subcuticular stitch of Vicryl 4-0.  Hemostasis is adequate at  that level.  We also remove the vaginal instruments.  The estimated  blood loss was minimal.  No complications occurred, and the patient was  brought to recovery room in good, stable status.      Genia Del, M.D.  Electronically Signed     ML/MEDQ  D:  08/01/2007  T:  08/02/2007  Job:  [id number]

## 2011-04-21 NOTE — Op Note (Signed)
NAME:  Jane [name], Jane [name]                         ACCOUNT NO.:  [id number]   MEDICAL RECORD NO.:  [id number]                   PATIENT TYPE:  AMB   LOCATION:  SDC                                  FACILITY:  [facility]   PHYSICIAN:  Genia Del, M.D.             DATE OF BIRTH:  1961/03/23   DATE OF PROCEDURE:  02/12/2004  DATE OF DISCHARGE:                                 OPERATIVE REPORT   PREOPERATIVE DIAGNOSIS:  Nine plus weeks with missed abortion.   POSTOPERATIVE DIAGNOSIS:  Nine plus weeks with missed abortion.   INTERVENTION:  Dilatation and evacuation.   SURGEON:  Dr. Genia Del.   ANESTHESIOLOGIST:  Dr. Harvest Forest.   DESCRIPTION OF PROCEDURE:  Under MAC analgesia, the patient is in lithotomy  position.  She is prepped with Hibiclens on the suprapubic, vulvar, and  vaginal areas and draped as usual.  The vaginal exam reveals a retroverted  uterus about 8-[redacted] weeks gestation.  The cervix is long and closed.  No  bleeding.  No adnexal mass.  The speculum is introduced in the vagina.  The  paracervical block is done with lidocaine 1% plain, 20 mL total at 4 o'clock  and 8 o'clock.  The anterior lip of the cervix is grasped with a tenaculum,  and dilatation of the cervix is done with Hagar dilators up to #31 without  difficulty.  A #9 curved curette is used to suction the intrauterine cavity.  Products of conception are sent to pathology, corresponding to 8-[redacted] weeks  gestation.  We then use the sharp curette to systematically curettage the  intrauterine cavity.  The uterine sound is heard all over.  We then use the  suction curette again to empty the uterine cavity of any products of  conception or blood clots.  The uterus contracts well.  Hemostasis was  adequate.  We removed the tenaculum.  No bleeding is present at that level.  The speculum is also removed.  The estimated blood loss was less than 50 mL.  The patient is Rh positive.  No complication occurred, and the patient was  transferred to recovery room in good status.                                               Genia Del, M.D.    ML/MEDQ  D:  02/12/2004  T:  02/13/2004  Job:  [id number]

## 2011-05-05 ENCOUNTER — Other Ambulatory Visit: Payer: Self-pay | Admitting: Dermatology

## 2011-06-27 ENCOUNTER — Encounter: Payer: Self-pay | Admitting: Cardiovascular Disease

## 2011-07-05 ENCOUNTER — Ambulatory Visit: Payer: Self-pay | Admitting: Family Medicine

## 2011-09-06 ENCOUNTER — Other Ambulatory Visit: Payer: Self-pay | Admitting: Orthopedic Surgery

## 2011-09-06 DIAGNOSIS — M79671 Pain in right foot: Secondary | ICD-10-CM

## 2011-09-08 ENCOUNTER — Ambulatory Visit
Admission: RE | Admit: 2011-09-08 | Discharge: 2011-09-08 | Disposition: A | Discharge status: Home / Self Care | Payer: Federal, State, Local not specified - PPO | Source: Ambulatory Visit | Attending: Orthopedic Surgery | Admitting: Orthopedic Surgery

## 2011-09-08 DIAGNOSIS — M79671 Pain in right foot: Secondary | ICD-10-CM

## 2011-09-08 MED ORDER — METHYLPREDNISOLONE ACETATE 40 MG/ML INJ SUSP (RADIOLOG
120.0000 mg | Freq: Once | INTRAMUSCULAR | Status: AC
  Administered 2011-09-08: 120 mg via INTRA_ARTICULAR

## 2011-09-08 MED ORDER — IOHEXOL 180 MG/ML  SOLN
1.0000 mL | Freq: Once | INTRAMUSCULAR | Status: AC | PRN
  Administered 2011-09-08: 1 mL via INTRA_ARTICULAR

## 2011-09-15 LAB — CBC
HCT: 42.5
Hemoglobin: 14.6
MCHC: 34.5
Platelets: 301
RDW: 13.3

## 2011-09-15 LAB — PREGNANCY, URINE: Preg Test, Ur: NEGATIVE

## 2012-02-27 ENCOUNTER — Ambulatory Visit: Payer: Self-pay | Admitting: Family Medicine

## 2012-07-02 ENCOUNTER — Other Ambulatory Visit: Payer: Self-pay | Admitting: Orthopedic Surgery

## 2012-07-02 DIAGNOSIS — M19079 Primary osteoarthritis, unspecified ankle and foot: Secondary | ICD-10-CM

## 2012-07-03 ENCOUNTER — Ambulatory Visit
Admission: RE | Admit: 2012-07-03 | Discharge: 2012-07-03 | Disposition: A | Discharge status: Home / Self Care | Payer: Federal, State, Local not specified - PPO | Source: Ambulatory Visit | Attending: Orthopedic Surgery | Admitting: Orthopedic Surgery

## 2012-07-03 DIAGNOSIS — M19079 Primary osteoarthritis, unspecified ankle and foot: Secondary | ICD-10-CM

## 2012-07-03 MED ORDER — METHYLPREDNISOLONE ACETATE 40 MG/ML INJ SUSP (RADIOLOG
120.0000 mg | Freq: Once | INTRAMUSCULAR | Status: AC
  Administered 2012-07-03: 120 mg via INTRA_ARTICULAR

## 2012-07-03 MED ORDER — IOHEXOL 180 MG/ML  SOLN
1.0000 mL | Freq: Once | INTRAMUSCULAR | Status: AC | PRN
  Administered 2012-07-03: 1 mL via INTRA_ARTICULAR

## 2012-08-21 ENCOUNTER — Ambulatory Visit: Payer: Self-pay | Admitting: Family Medicine

## 2012-10-20 ENCOUNTER — Ambulatory Visit (INDEPENDENT_AMBULATORY_CARE_PROVIDER_SITE_OTHER): Payer: Federal, State, Local not specified - PPO | Admitting: Family Medicine

## 2012-10-20 VITALS — BP 125/76 | HR 70 | Temp 98.4°F | Resp 12 | Ht 67.0 in | Wt 203.0 lb

## 2012-10-20 DIAGNOSIS — IMO0002 Reserved for concepts with insufficient information to code with codable children: Secondary | ICD-10-CM

## 2012-10-20 DIAGNOSIS — Z0000 Encounter for general adult medical examination without abnormal findings: Secondary | ICD-10-CM

## 2012-10-20 DIAGNOSIS — M179 Osteoarthritis of knee, unspecified: Secondary | ICD-10-CM

## 2012-10-20 DIAGNOSIS — E890 Postprocedural hypothyroidism: Secondary | ICD-10-CM | POA: Insufficient documentation

## 2012-10-20 DIAGNOSIS — IMO0001 Reserved for inherently not codable concepts without codable children: Secondary | ICD-10-CM

## 2012-10-20 DIAGNOSIS — M1710 Unilateral primary osteoarthritis, unspecified knee: Secondary | ICD-10-CM

## 2012-10-20 DIAGNOSIS — M797 Fibromyalgia: Secondary | ICD-10-CM

## 2012-10-20 DIAGNOSIS — Z01419 Encounter for gynecological examination (general) (routine) without abnormal findings: Secondary | ICD-10-CM | POA: Insufficient documentation

## 2012-10-20 LAB — POCT UA - MICROSCOPIC ONLY
Crystals, Ur, HPF, POC: NEGATIVE
Mucus, UA: NEGATIVE
WBC, Ur, HPF, POC: NEGATIVE
Yeast, UA: NEGATIVE

## 2012-10-20 LAB — POCT CBC
Lymph, poc: 1.6 (ref 0.6–3.4)
MCH, POC: 27.6 pg (ref 27–31.2)
MCHC: 29.9 g/dL — AB (ref 31.8–35.4)
MCV: 92.1 fL (ref 80–97)
MID (cbc): 0.4 (ref 0–0.9)
MPV: 8.6 fL (ref 0–99.8)
POC LYMPH PERCENT: 26.7 %L (ref 10–50)
POC MID %: 6.7 %M (ref 0–12)
Platelet Count, POC: 362 10*3/uL (ref 142–424)
RDW, POC: 13.8 %
WBC: 5.9 10*3/uL (ref 4.6–10.2)

## 2012-10-20 LAB — COMPREHENSIVE METABOLIC PANEL
AST: 21 U/L (ref 0–37)
BUN: 15 mg/dL (ref 6–23)
Calcium: 9.5 mg/dL (ref 8.4–10.5)
Chloride: 104 mEq/L (ref 96–112)
Creat: 0.63 mg/dL (ref 0.50–1.10)
Total Bilirubin: 0.4 mg/dL (ref 0.3–1.2)

## 2012-10-20 LAB — TSH: TSH: 2.63 u[IU]/mL (ref 0.350–4.500)

## 2012-10-20 LAB — LIPID PANEL
Cholesterol: 214 mg/dL — ABNORMAL HIGH (ref 0–200)
Triglycerides: 122 mg/dL (ref <150)
VLDL: 24 mg/dL (ref 0–40)

## 2012-10-20 LAB — POCT URINALYSIS DIPSTICK
Ketones, UA: NEGATIVE
Protein, UA: NEGATIVE
Spec Grav, UA: 1.02
pH, UA: 6.5

## 2012-10-20 NOTE — Patient Instructions (Addendum)
1. Routine general medical examination at a health care facility  EKG 12-Lead, POCT CBC, POCT glycosylated hemoglobin (Hb A1C), POCT urinalysis dipstick, POCT UA - Microscopic Only, Vitamin D 25 hydroxy, Comprehensive metabolic panel, TSH, T4, free, Lipid panel, Vitamin B12, Pap IG and HPV (high risk) DNA detection

## 2012-10-20 NOTE — Assessment & Plan Note (Signed)
Anticipatory guidance provided --- weight loss, exercise.  Pap smear obtained.  Mammogram and colonoscopy UTD.  Clarify date of last TDAP.  Influenza vaccine UTD.  Obtain labs.

## 2012-10-20 NOTE — Assessment & Plan Note (Signed)
Pap smear obtained.  Mammogram UTD.  Reviewed current pap smear guidelines; pt desires pap smear.

## 2012-10-20 NOTE — Progress Notes (Signed)
[address]   [place], [place]  [address]   [phone]  Subjective:    Patient ID: Jane [name], female    DOB: 12-Jun-1961, 51 y.o.   MRN: [id number]  HPIThis 51 y.o. female presents for evaluation for CPE.  Last physical 06/2011.  Mammogram 07/2012.  Colonoscopy age 64; repeat 10 years.  TDAP unknown.  Flu vaccine 2013.  Pneumovax never.  Eye exam 2012; +glasses.  Dental exam 2013.    1.  L knee OA: scheduled for surgery on knee.  S/p Synvest injection x 2.  Followed up this week for third injection but did not receive due to allergic reaction to Synvest.  Total knee replacement by [facility].     Review of Systems  Constitutional: Negative for fever, chills, diaphoresis, activity change, appetite change, fatigue and unexpected weight change.  HENT: Negative for hearing loss, ear pain, nosebleeds, congestion, sore throat, facial swelling, rhinorrhea, sneezing, drooling, mouth sores, trouble swallowing, neck pain, neck stiffness, dental problem, voice change, postnasal drip, sinus pressure, tinnitus and ear discharge.   Eyes: Negative for photophobia, pain, discharge, redness, itching and visual disturbance.  Respiratory: Negative for apnea, cough, choking, chest tightness, shortness of breath, wheezing and stridor.   Cardiovascular: Negative for chest pain, palpitations and leg swelling.  Gastrointestinal: Negative for nausea, vomiting, abdominal pain, diarrhea, constipation, blood in stool, abdominal distention, anal bleeding and rectal pain.  Genitourinary: Negative for dysuria, urgency, frequency, hematuria, flank pain, decreased urine volume, vaginal bleeding, vaginal discharge, enuresis, difficulty urinating, genital sores, vaginal pain, pelvic pain and dyspareunia.  Musculoskeletal: Positive for myalgias and arthralgias. Negative for back pain, joint swelling and gait problem.  Skin: Negative for color change, pallor, rash and wound.  Neurological: Negative for dizziness, tremors,  seizures, syncope, facial asymmetry, speech difficulty, weakness, light-headedness, numbness and headaches.  Hematological: Negative for adenopathy. Does not bruise/bleed easily.  Psychiatric/Behavioral: Negative for suicidal ideas, hallucinations, behavioral problems, confusion, sleep disturbance, self-injury, dysphoric mood, decreased concentration and agitation. The patient is not nervous/anxious and is not hyperactive.         Past Medical History  Diagnosis Date  . GERD (gastroesophageal reflux disease)   . HLD (hyperlipidemia)   . MR (mitral regurgitation)     moderate; probably MVP. echo 3/10 at [facility] cardiology with normal EF, moderate MR   . Atypical chest pain     hx   . Arthritis     Knees, feet, elbows , neck  . Fibromyalgia 12/04/2010    [facility] in [place]; Dr. Gwenevere Ghazi; followed every six months.  . Asthma     mild; assoc with seasonal allergies; uses Albuterol once per year on average.  Marland Kitchen Heart murmur     Mitral valve Prolapse  . Hypothyroidism post radioactive iodine    Hyperthyroidism; now secondary hypothyroidism    Past Surgical History  Procedure Date  . Knee surgery 7/10  . 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 Inner thigh surgery   . Left foot surgery     for plantar fascitis  . Novascue uterine ablation 2007  . Colonoscopy 12/04/2009    normal; [facility]    Prior to Admission medications   Medication Sig Start Date End Date Taking? Authorizing Provider  DULoxetine (CYMBALTA) 60 MG capsule Take 120 mg by mouth daily.   Yes Historical Provider, MD  levothyroxine (SYNTHROID, LEVOTHROID) 112 MCG tablet Take 112 mcg by mouth daily.     Yes Historical Provider, MD  meloxicam [facility])  7.5 MG tablet Take 7.5 mg by mouth as needed.     Yes Historical Provider, MD  omeprazole (PRILOSEC) 20 MG capsule Take 20 mg by mouth daily.     Yes Historical Provider, MD  levalbuterol (XOPENEX HFA) 45 MCG/ACT inhaler Inhale 2 puffs into the lungs 2 (two)  times daily. 2 puffs in am and 2 puffs in pm     Historical Provider, MD  loratadine (CLARITIN) 10 MG tablet Take 10 mg by mouth daily.      Historical Provider, MD    Allergies  Allergen Reactions  . Celebrex (Celecoxib) Swelling    Developed Facial swelling     History   Social History  . Marital Status: Married    Spouse Name: N/A    Number of Children: N/A  . Years of Education: N/A   Occupational History  . Not on file.   Social History Main Topics  . Smoking status: Former Games developer  . Smokeless tobacco: Never Used     Comment: smoked 2ppd for 15 years; quit in 1995   . Alcohol Use: 0.0 oz/week    0 Glasses of wine per week     Comment: social, rare  . Drug Use: No  . Sexually Active: Yes    Birth Control/ Protection: Post-menopausal   Other Topics Concern  . Not on file   Social History Narrative   Marital status:  Married x 18 years; happily married no abuse, no children.   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    Does not get regular exercise.     Family History  Problem Relation Age of Onset  . Stroke Father   . COPD Father   . Hyperlipidemia Mother   . Heart disease Mother   . Hypertension Sister     Objective:   Physical Exam  Nursing note and vitals reviewed. Constitutional: She is oriented to person, place, and time. She appears well-developed and well-nourished. No distress.  HENT:  Head: Normocephalic and atraumatic.  Right Ear: External ear normal.  Left Ear: External ear normal.  Nose: Nose normal.  Mouth/Throat: Oropharynx is clear and moist.  Eyes: Conjunctivae normal and EOM are normal. Pupils are equal, round, and reactive to light.  Neck: Normal range of motion. Neck supple. No JVD present. No thyromegaly present.  Cardiovascular: Normal rate, regular rhythm and intact distal pulses.  Exam reveals no gallop and no friction rub.   Murmur heard.  Systolic murmur is present with a grade of 1/6    Pulmonary/Chest: Effort normal and breath sounds normal. No respiratory distress. She has no wheezes. She has no rales.  Abdominal: Soft. Bowel sounds are normal. She exhibits no distension and no mass. There is no tenderness. There is no rebound and no guarding. Hernia confirmed negative in the right inguinal area and confirmed negative in the left inguinal area.  Genitourinary: Vagina normal and uterus normal. No breast swelling, tenderness, discharge or bleeding. There is no rash, tenderness, lesion or injury on the right labia. There is no rash, tenderness, lesion or injury on the left labia. No vaginal discharge found.  Musculoskeletal:       Right shoulder: Normal.       Left shoulder: Normal.       Cervical back: She exhibits decreased range of motion.  Lymphadenopathy:    She has no cervical adenopathy.       Right: No inguinal adenopathy present.  Left: No inguinal adenopathy present.  Neurological: She is alert and oriented to person, place, and time. She has normal reflexes. No cranial nerve deficit. She exhibits normal muscle tone. Coordination normal.  Skin: She is not diaphoretic.       Sun related changes upper back; +40mm diameter hyperpigmented nevus without irregularity or color variation upper L back.  Psychiatric: She has a normal mood and affect. Her behavior is normal. Judgment and thought content normal.     EKG:  NSR; no changes. Results for orders placed in visit on 10/20/12  POCT CBC      Component Value Range   WBC 5.9  4.6 - 10.2 K/uL   Lymph, poc 1.6  0.6 - 3.4   POC LYMPH PERCENT 26.7  10 - 50 %L   MID (cbc) 0.4  0 - 0.9   POC MID % 6.7  0 - 12 %M   POC Granulocyte 3.9  2 - 6.9   Granulocyte percent 66.6  37 - 80 %G   RBC 4.97  4.04 - 5.48 M/uL   Hemoglobin 13.7  12.2 - 16.2 g/dL   HCT, POC 40.9  81.1 - 47.9 %   MCV 92.1  80 - 97 fL   MCH, POC 27.6  27 - 31.2 pg   MCHC 29.9 (*) 31.8 - 35.4 g/dL   RDW, POC 91.4     Platelet Count, POC 362  142 -  424 K/uL   MPV 8.6  0 - 99.8 fL  POCT GLYCOSYLATED HEMOGLOBIN (HGB A1C)      Component Value Range   Hemoglobin A1C 5.6    POCT URINALYSIS DIPSTICK      Component Value Range   Color, UA yellow     Clarity, UA clear     Glucose, UA neg     Bilirubin, UA neg     Ketones, UA neg     Spec Grav, UA 1.020     Blood, UA trace     pH, UA 6.5     Protein, UA neg     Urobilinogen, UA 0.2     Nitrite, UA neg     Leukocytes, UA Negative    POCT UA - MICROSCOPIC ONLY      Component Value Range   WBC, Ur, HPF, POC neg     RBC, urine, microscopic neg     Bacteria, U Microscopic neg     Mucus, UA neg     Epithelial cells, urine per micros 2-4     Crystals, Ur, HPF, POC neg     Casts, Ur, LPF, POC neg     Yeast, UA neg      Assessment & Plan:   1. Routine general medical examination at a health care facility  EKG 12-Lead, POCT CBC, POCT glycosylated hemoglobin (Hb A1C), POCT urinalysis dipstick, POCT UA - Microscopic Only, Vitamin D 25 hydroxy, Comprehensive metabolic panel, TSH, T4, free, Lipid panel, Vitamin B12, Pap IG and HPV (high risk) DNA detection

## 2012-10-22 LAB — PAP IG AND HPV HIGH-RISK

## 2012-12-09 ENCOUNTER — Ambulatory Visit: Payer: Self-pay | Admitting: General Practice

## 2012-12-09 LAB — CBC
HCT: 41 % (ref 35.0–47.0)
MCH: 28.8 pg (ref 26.0–34.0)
MCV: 86 fL (ref 80–100)
Platelet: 293 10*3/uL (ref 150–440)
RDW: 13.3 % (ref 11.5–14.5)
WBC: 5.5 10*3/uL (ref 3.6–11.0)

## 2012-12-09 LAB — BASIC METABOLIC PANEL
Anion Gap: 7 (ref 7–16)
Calcium, Total: 9 mg/dL (ref 8.5–10.1)
Chloride: 104 mmol/L (ref 98–107)
Co2: 28 mmol/L (ref 21–32)
Creatinine: 0.67 mg/dL (ref 0.60–1.30)
EGFR (African American): >60
Osmolality: 279 (ref 275–301)
Potassium: 4 mmol/L (ref 3.5–5.1)

## 2012-12-09 LAB — MRSA PCR SCREENING

## 2012-12-09 LAB — URINALYSIS, COMPLETE
Bacteria: NONE SEEN
Bilirubin,UR: NEGATIVE
Glucose,UR: NEGATIVE mg/dL (ref 0–75)
Ketone: NEGATIVE
Ph: 6 (ref 4.5–8.0)
Protein: NEGATIVE
RBC,UR: 3 /HPF (ref 0–5)
Squamous Epithelial: <1
WBC UR: NONE SEEN /HPF (ref 0–5)

## 2012-12-09 LAB — PROTIME-INR: INR: 0.9

## 2012-12-25 ENCOUNTER — Inpatient Hospital Stay: Payer: Self-pay | Admitting: General Practice

## 2012-12-26 LAB — HEMOGLOBIN: HGB: 11.7 g/dL — ABNORMAL LOW (ref 12.0–16.0)

## 2012-12-26 LAB — BASIC METABOLIC PANEL
Anion Gap: 8 (ref 7–16)
Chloride: 102 mmol/L (ref 98–107)
Co2: 27 mmol/L (ref 21–32)
Creatinine: 0.69 mg/dL (ref 0.60–1.30)
Glucose: 106 mg/dL — ABNORMAL HIGH (ref 65–99)
Osmolality: 275 (ref 275–301)
Potassium: 4 mmol/L (ref 3.5–5.1)
Sodium: 137 mmol/L (ref 136–145)

## 2012-12-27 LAB — BASIC METABOLIC PANEL
BUN: 11 mg/dL (ref 7–18)
Calcium, Total: 8.1 mg/dL — ABNORMAL LOW (ref 8.5–10.1)
Co2: 28 mmol/L (ref 21–32)
Creatinine: 0.61 mg/dL (ref 0.60–1.30)
EGFR (African American): >60
EGFR (Non-African Amer.): >60
Glucose: 85 mg/dL (ref 65–99)
Osmolality: 274 (ref 275–301)

## 2012-12-27 LAB — HEMOGLOBIN: HGB: 11.3 g/dL — ABNORMAL LOW (ref 12.0–16.0)

## 2013-02-17 ENCOUNTER — Encounter: Payer: Self-pay | Admitting: Family Medicine

## 2013-03-13 ENCOUNTER — Ambulatory Visit (INDEPENDENT_AMBULATORY_CARE_PROVIDER_SITE_OTHER): Payer: Federal, State, Local not specified - PPO | Admitting: Emergency Medicine

## 2013-03-13 VITALS — BP 150/88 | HR 100 | Temp 98.0°F | Resp 18 | Ht 67.0 in | Wt 209.0 lb

## 2013-03-13 DIAGNOSIS — J309 Allergic rhinitis, unspecified: Secondary | ICD-10-CM

## 2013-03-13 MED ORDER — DESLORATADINE 5 MG PO TABS: 5.0000 mg | ORAL_TABLET | Freq: Every day | ORAL | Status: DC

## 2013-03-13 MED ORDER — ALBUTEROL SULFATE HFA 108 (90 BASE) MCG/ACT IN AERS: 2.0000 | INHALATION_SPRAY | RESPIRATORY_TRACT | Status: DC | PRN

## 2013-03-13 NOTE — Progress Notes (Signed)
[facility] [address] [place] [address] [phone]  Date:  03/13/2013   Name:  Jane [name]   DOB:  Sep 10, 1961   MRN:  [id number]  PCP:  Nilda Simmer, MD    Chief Complaint: Shortness of Breath, Headache and Insomnia   History of Present Illness:  Jane [name] is a 52 y.o. very pleasant female patient who presents with the following:  Has seasonal allergies and has four day history of shortness of breath.  Short of breath with exertion.  No peripheral edema.  No chest pain or nausea or vomiting.  No wheezing.  No cough or coryza.  No fever or chills.  Says this time of year she has wheezing and it improves after a short round of albuterol MDI.  No improvement with over the counter medications or other home remedies. Denies insomnia Denies other complaint or health concern today.   Patient Active Problem List  Diagnosis  . HYPERLIPIDEMIA  . ALLERGIC RHINITIS  . MURMUR  . DYSPNEA ON EXERTION  . CHEST PAIN UNSPECIFIED  . Routine general medical examination at a health care facility  . Routine gynecological examination  . Osteoarthritis, knee  . Fibromyalgia  . Hypothyroidism following radioiodine therapy    Past Medical History  Diagnosis Date  . GERD (gastroesophageal reflux disease)   . HLD (hyperlipidemia)   . MR (mitral regurgitation)     moderate; probably MVP. echo 3/10 at [facility] cardiology with normal EF, moderate MR   . Atypical chest pain     hx   . Arthritis     Knees, feet, elbows , neck  . Fibromyalgia 12/04/2010    [facility] in [place]; Dr. Gwenevere Ghazi; followed every six months.  . Asthma     mild; assoc with seasonal allergies; uses Albuterol once per year on average.  Marland Kitchen Heart murmur     Mitral valve Prolapse  . Hypothyroidism post radioactive iodine    Hyperthyroidism; now secondary hypothyroidism    Past Surgical History  Procedure Laterality Date  . Knee surgery  7/10  . Laparoscopy  1984    for  endomeriosis  . Inner thigh surgery    . Left foot surgery      for plantar fascitis  . Novascue uterine ablation  2007  . Colonoscopy  12/04/2009    normal; [facility]    History  Substance Use Topics  . Smoking status: Former Games developer  . Smokeless tobacco: Never Used     Comment: smoked 2ppd for 15 years; quit in 1995   . Alcohol Use: 0.0 oz/week    0 Glasses of wine per week     Comment: social, rare    Family History  Problem Relation Age of Onset  . Stroke Father   . COPD Father   . Hyperlipidemia Mother   . Heart disease Mother   . Hypertension Sister     Allergies  Allergen Reactions  . Celebrex (Celecoxib) Swelling    Developed Facial swelling     Medication list has been reviewed and updated.  Current Outpatient Prescriptions on File Prior to Visit  Medication Sig Dispense Refill  . DULoxetine (CYMBALTA) 60 MG capsule Take 120 mg by mouth daily.      Marland Kitchen levothyroxine (SYNTHROID, LEVOTHROID) 112 MCG tablet Take 112 mcg by mouth daily.        Marland Kitchen loratadine (CLARITIN) 10 MG tablet Take 10 mg by mouth daily.        Marland Kitchen  omeprazole (PRILOSEC) 20 MG capsule Take 20 mg by mouth daily.        Marland Kitchen levalbuterol (XOPENEX HFA) 45 MCG/ACT inhaler Inhale 2 puffs into the lungs 2 (two) times daily. 2 puffs in am and 2 puffs in pm       . meloxicam (MOBIC) 7.5 MG tablet Take 7.5 mg by mouth as needed.         No current facility-administered medications on file prior to visit.    Review of Systems:  As per HPI, otherwise negative.    Physical Examination: Filed Vitals:   03/13/13 1327  BP: 150/88  Pulse: 100  Temp: 98 F (36.7 C)  Resp: 18   Filed Vitals:   03/13/13 1327  Height: 5\' 7"  (1.702 m)  Weight: 209 lb (94.802 kg)   Body mass index is 32.73 kg/(m^2). Ideal Body Weight: Weight in (lb) to have BMI = 25: 159.3  GEN: WDWN, NAD, Non-toxic, A & O x 3 HEENT: Atraumatic, Normocephalic. Neck supple. No masses, No LAD. Ears and Nose: No external deformity.  Nasal  mucosa pallid and edematous CV: RRR, No M/G/R. No JVD. No thrill. No extra heart sounds. PULM: CTA B, no wheezes, crackles, rhonchi. No retractions. No resp. distress. No accessory muscle use. ABD: S, NT, ND, +BS. No rebound. No HSM. EXTR: No c/c/e NEURO Normal gait.  PSYCH: Normally interactive. Conversant. Not depressed or anxious appearing.  Calm demeanor.   Peak flow 100 over predicted.  Assessment and Plan: Seasonal allergy claritin Albuterol MDI  Follow up with Dr Katrinka Blazing  Signed,  Phillips Odor, MD

## 2013-03-13 NOTE — Patient Instructions (Addendum)
Allergic Rhinitis  Allergic rhinitis is when the mucous membranes in the nose respond to allergens. Allergens are particles in the air that cause your body to have an allergic reaction. This causes you to release allergic antibodies. Through a chain of events, these eventually cause you to release histamine into the blood stream (hence the use of antihistamines). Although meant to be protective to the body, it is this release that causes your discomfort, such as frequent sneezing, congestion and an itchy runny nose.    CAUSES    The pollen allergens may come from grasses, trees, and weeds. This is seasonal allergic rhinitis, or "hay fever." Other allergens cause year-round allergic rhinitis (perennial allergic rhinitis) such as house dust mite allergen, pet dander and mold spores.    SYMPTOMS     Nasal stuffiness (congestion).   Runny, itchy nose with sneezing and tearing of the eyes.   There is often an itching of the mouth, eyes and ears.  It cannot be cured, but it can be controlled with medications.  DIAGNOSIS    If you are unable to determine the offending allergen, skin or blood testing may find it.  TREATMENT     Avoid the allergen.   Medications and allergy shots (immunotherapy) can help.   Hay fever may often be treated with antihistamines in pill or nasal spray forms. Antihistamines block the effects of histamine. There are over-the-counter medicines that may help with nasal congestion and swelling around the eyes. Check with your caregiver before taking or giving this medicine.  If the treatment above does not work, there are many new medications your caregiver can prescribe. Stronger medications may be used if initial measures are ineffective. Desensitizing injections can be used if medications and avoidance fails. Desensitization is when a patient is given ongoing shots until the body becomes less sensitive to the allergen. Make sure you follow up with your caregiver if problems continue.   SEEK MEDICAL CARE IF:     You develop fever (more than 100.5 F (38.1 C).   You develop a cough that does not stop easily (persistent).   You have shortness of breath.   You start wheezing.   Symptoms interfere with normal daily activities.  Document Released: 08/15/2001 Document Revised: 02/12/2012 Document Reviewed: 02/24/2009  [employer] Patient Information 2013 [employer].

## 2013-03-17 ENCOUNTER — Telehealth: Payer: Self-pay

## 2013-03-17 NOTE — Telephone Encounter (Signed)
Left message for her to call me back. 

## 2013-03-17 NOTE — Telephone Encounter (Signed)
Pt asking for return call from Dr Flonnie Overman she is not doing well??   Best phone [phone]

## 2013-03-18 NOTE — Telephone Encounter (Signed)
Called again, left another message for her to call me back.  

## 2013-03-20 NOTE — Telephone Encounter (Signed)
Noted; thanks for attempting to call.  Called patient; now feeling better; taking Clarinex; using Albuterol as needed.  Usually needing Albuterol twice daily.  S/p knee replacement in 12/2012; returns to work next week 1/2 time.

## 2013-03-20 NOTE — Telephone Encounter (Signed)
Called again, to you FYI she does not call me back. Multiple attempts.

## 2013-05-21 ENCOUNTER — Other Ambulatory Visit: Payer: Self-pay | Admitting: Family Medicine

## 2013-08-12 ENCOUNTER — Telehealth: Payer: Self-pay

## 2013-08-12 NOTE — Telephone Encounter (Signed)
Pt has an appt scheduled to see dr Katrinka Blazing on 08/27/13, but pt requested we leave a message with dr Katrinka Blazing as well. Pt has been experiencing some vertigo, confusion and memory problems recently. She said she thinks her "thinking pattern is not right" wants to know if this should be evaluated by dr Katrinka Blazing on 08/27/13, as scheduled or should she see a specialist. Pt would prefer to speak directly with dr Katrinka Blazing

## 2013-08-13 NOTE — Telephone Encounter (Signed)
Call ---- may need to see a specialist but not sure until evaluated in clinic by myself.  Recommend presenting for evaluation sooner than 08/27/13; I am working this Saturday if she would like to come in then. If symptoms get severe, she will need to present to ED.

## 2013-08-13 NOTE — Telephone Encounter (Signed)
Called patient to advise. She will try to come in on Saturday.

## 2013-08-27 ENCOUNTER — Encounter: Payer: Self-pay | Admitting: Family Medicine

## 2013-08-27 ENCOUNTER — Ambulatory Visit (INDEPENDENT_AMBULATORY_CARE_PROVIDER_SITE_OTHER): Payer: Federal, State, Local not specified - PPO | Admitting: Family Medicine

## 2013-08-27 VITALS — BP 140/76 | HR 83 | Temp 98.2°F | Resp 16 | Ht 66.5 in | Wt 210.8 lb

## 2013-08-27 DIAGNOSIS — Z1239 Encounter for other screening for malignant neoplasm of breast: Secondary | ICD-10-CM

## 2013-08-27 DIAGNOSIS — R413 Other amnesia: Secondary | ICD-10-CM

## 2013-08-27 DIAGNOSIS — R011 Cardiac murmur, unspecified: Secondary | ICD-10-CM

## 2013-08-27 DIAGNOSIS — N952 Postmenopausal atrophic vaginitis: Secondary | ICD-10-CM

## 2013-08-27 DIAGNOSIS — Z23 Encounter for immunization: Secondary | ICD-10-CM

## 2013-08-27 DIAGNOSIS — R42 Dizziness and giddiness: Secondary | ICD-10-CM

## 2013-08-27 DIAGNOSIS — R61 Generalized hyperhidrosis: Secondary | ICD-10-CM

## 2013-08-27 LAB — COMPREHENSIVE METABOLIC PANEL
AST: 17 U/L (ref 0–37)
Albumin: 4.2 g/dL (ref 3.5–5.2)
Alkaline Phosphatase: 83 U/L (ref 39–117)
Potassium: 4.1 mEq/L (ref 3.5–5.3)
Sodium: 140 mEq/L (ref 135–145)
Total Protein: 6.9 g/dL (ref 6.0–8.3)

## 2013-08-27 LAB — CBC WITH DIFFERENTIAL/PLATELET
Basophils Absolute: 0 10*3/uL (ref 0.0–0.1)
Basophils Relative: 0 % (ref 0–1)
Eosinophils Relative: 2 % (ref 0–5)
HCT: 37.9 % (ref 36.0–46.0)
Lymphocytes Relative: 18 % (ref 12–46)
MCHC: 33.8 g/dL (ref 30.0–36.0)
MCV: 83.8 fL (ref 78.0–100.0)
Monocytes Absolute: 0.5 10*3/uL (ref 0.1–1.0)
RDW: 13.9 % (ref 11.5–15.5)

## 2013-08-27 NOTE — Patient Instructions (Signed)

## 2013-08-27 NOTE — Progress Notes (Signed)
[address]   [place], [place]  [address]   [phone]  Subjective:    Patient ID: Jane [name], female    DOB: Apr 18, 1961, 52 y.o.   MRN: [id number]  HPI This 52 y.o. female presents for evaluation of dizziness/rush sensation with changing positions.   No pain with moving head.  Has a weird sensation with moving head.  Room not spinning.  Inside head might be spinning; feels slow.  On Cymbalta currently.  Onset two months.  Increased Cymbalta to 120mg  in past three months; Gabapentin added in past month.  Has been holding Meloxicam and Nabumetone. No headaches.  No blurred vision or diplopia. Sometimes will feel really heavy; no sinus presure; +fatigued.  Dizziness always occurs when going to L and back; can occur with sleep.  Occurs daily; very short duration; less than 5 seconds because moving head resolves symptoms. R hearing loss yesterday; felt like sinus.  After lunch, R hearing returned.  No tinnitus.  No head congestion.  Didn't sleep well the night before.  No unsteadiness with walking.  2.  Mouth, tongue hurts: started since starting Gabapentin.  Jane [name] in [place] managing fibromyalgia.    3.  Memory Loss: onset in past two months.  Has left something wrong on in kitchen.   Lots of stressors. Gained weight as soon as returned to work.    4.  Diaphoresis:  Occurred yesterday morning.  Out walking with husband; was standing still and became very sweaty, light-headed, malaise.  Had not eaten anything.  Resolved after 15 minutes. No associated chest pain,palpitations, SOB, headache, vision changes, focal weakness, paresthesias.  No syncope.  5. Flatus:  Horrible lately; may be medication; +horrible heartburn; no constipation or diarrhea; no bloody stools or black stools.    6. Atrophic Vaginitis: requesting estrogen cream.  Horrible pain with sex.  Review of Systems  Constitutional: Positive for diaphoresis and fatigue. Negative for fever, chills, activity change and  appetite change.  HENT: Positive for hearing loss and mouth sores. Negative for congestion, dental problem, ear pain, facial swelling, postnasal drip, rhinorrhea and sinus pressure.   Eyes: Negative for visual disturbance.  Respiratory: Negative for shortness of breath.   Cardiovascular: Negative for chest pain, palpitations and leg swelling.  Gastrointestinal: Negative for nausea, vomiting, abdominal pain, diarrhea and abdominal distention.  Genitourinary: Positive for vaginal pain. Negative for hematuria, vaginal bleeding, vaginal discharge and genital sores.  Neurological: Positive for dizziness and light-headedness. Negative for seizures, syncope, facial asymmetry, speech difficulty, weakness, numbness and headaches.  Psychiatric/Behavioral: Positive for decreased concentration. Negative for sleep disturbance and dysphoric mood. The patient is nervous/anxious.    Past Medical History  Diagnosis Date  . GERD (gastroesophageal reflux disease)   . HLD (hyperlipidemia)   . MR (mitral regurgitation)     moderate; probably MVP. echo 3/10 at [facility] cardiology with normal EF, moderate MR   . Atypical chest pain     hx   . Arthritis     Knees, feet, elbows , neck  . Fibromyalgia 12/04/2010    [facility] in [place]; Dr. Gwenevere Ghazi; followed every six months.  . Asthma     mild; assoc with seasonal allergies; uses Albuterol once per year on average.  Marland Kitchen Heart murmur     Mitral valve Prolapse  . Hypothyroidism post radioactive iodine    Hyperthyroidism; now secondary hypothyroidism   Past Surgical History  Procedure Laterality Date  . Knee surgery  7/10  . 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 Inner thigh surgery    . Left foot surgery      for plantar fascitis  . Novascue uterine ablation  2007  . Colonoscopy  12/04/2009    normal; [facility]   Allergies  Allergen Reactions  . Celebrex [Celecoxib] Swelling    Developed Facial swelling    Current Outpatient Prescriptions  on File Prior to Visit  Medication Sig Dispense Refill  . albuterol (PROVENTIL HFA;VENTOLIN HFA) 108 (90 BASE) MCG/ACT inhaler Inhale 2 puffs into the lungs every 4 (four) hours as needed for wheezing (cough, shortness of breath or wheezing.).  1 Inhaler  1  . desloratadine (CLARINEX) 5 MG tablet Take 1 tablet (5 mg total) by mouth daily.  30 tablet  1  . DULoxetine (CYMBALTA) 60 MG capsule Take 120 mg by mouth daily.      Marland Kitchen levalbuterol (XOPENEX HFA) 45 MCG/ACT inhaler Inhale 2 puffs into the lungs 2 (two) times daily. 2 puffs in am and 2 puffs in pm       . levothyroxine (SYNTHROID, LEVOTHROID) 112 MCG tablet TAKE ONE TABLET EVERY MORNING ON AN EMPTY STOMACH  30 tablet  4  . loratadine (CLARITIN) 10 MG tablet Take 10 mg by mouth daily.        . meloxicam (MOBIC) 7.5 MG tablet Take 7.5 mg by mouth as needed.        . hydroxychloroquine (PLAQUENIL) 200 MG tablet Take 200 mg by mouth daily.       No current facility-administered medications on file prior to visit.   History   Social History  . Marital Status: Married    Spouse Name: N/A    Number of Children: N/A  . Years of Education: N/A   Occupational History  . Not on file.   Social History Main Topics  . Smoking status: Former Games developer  . Smokeless tobacco: Never Used     Comment: smoked 2ppd for 15 years; quit in 1995   . Alcohol Use: 0.0 oz/week    0 Glasses of wine per week     Comment: social, rare  . Drug Use: No  . Sexual Activity: Yes    Birth Control/ Protection: Post-menopausal   Other Topics Concern  . Not on file   Social History Narrative   Marital status:  Married x 18 years; happily married no abuse, no children.      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       Does not get regular exercise.    Family History  Problem Relation Age of Onset  . Stroke Father   . COPD Father   . Hyperlipidemia Mother   . Heart disease Mother   . Hypertension Sister        Objective:    Physical Exam  Nursing note and vitals reviewed. Constitutional: She is oriented to person, place, and time. She appears well-developed and well-nourished. No distress.  HENT:  Head: Normocephalic and atraumatic.  Right Ear: External ear normal.  Left Ear: External ear normal.  Nose: Nose normal.  Mouth/Throat: Oropharynx is clear and moist.  Eyes: Conjunctivae and EOM are normal. Pupils are equal, round, and reactive to light.  Neck: Normal range of motion. Neck supple. No thyromegaly present.  Cardiovascular: Normal rate and regular rhythm.  Exam reveals no gallop and no friction rub.   Murmur heard. Pulmonary/Chest: Effort normal and breath sounds normal.  She has no wheezes. She has no rales.  Abdominal: Soft. Bowel sounds are normal. She exhibits no distension. There is no tenderness. There is no rebound.  Lymphadenopathy:    She has no cervical adenopathy.  Neurological: She is alert and oriented to person, place, and time. She has normal reflexes. No cranial nerve deficit. She exhibits normal muscle tone. Coordination normal.  Dix-Hallpike without nystagmus but reproduced symptoms of dizziness.  Skin: Skin is warm and dry. She is not diaphoretic.  Psychiatric: She has a normal mood and affect. Her behavior is normal. Judgment and thought content normal.  MMSE 30/30.   INFLUENZA VACCINE ADMINISTERED.    Assessment & Plan:  Need for prophylactic vaccination and inoculation against influenza - Plan: Flu Vaccine QUAD 36+ mos IM  Dizziness and giddiness - Plan: Vitamin B12, Vit D  25 hydroxy (rtn osteoporosis monitoring), EKG 12-Lead, CBC with Differential  Diaphoresis - Plan: Comprehensive metabolic panel, EKG 12-Lead  Atrophic vaginitis  MURMUR  Breast cancer screening - Plan: MM Digital Screening  1. Dizziness:  New.  Onset occurred with increasing dose of Cymbalta; consistent with benign positional vertigo; occurs with turning head to L and back.  Recommend home exercises  daily in bed.  Obtain labs.   If hearing loss recurs, refer for CT or MRI brain. 2.  Diaphoresis: New.  Onset yesterday; no recurrence; EKG stable; obtain labs. If persists, will warrant referral to cardiology for stress testing and echo. 3.  Atrophic Vaginitis: worsening; agreeable to estrogen vaginal cream. 4. Heart murmur/MVP: stable; due for repeat echo in upcoming few months. 5.  Breast cancer screening: refer for mammogram. 6. Memory Loss: New.  Stress reaction most likely; normal MMSE in office. Obtain labs. Meds ordered this encounter  Medications  . nabumetone (RELAFEN) 500 MG tablet    Sig: Take 500 mg by mouth 2 (two) times daily.  Marland Kitchen gabapentin (NEURONTIN) 100 MG capsule    Sig: Take 100 mg by mouth 3 (three) times daily.  Marland Kitchen conjugated estrogens (PREMARIN) vaginal cream    Sig: Place vaginally daily.    Dispense:  42.5 g    Refill:  12

## 2013-08-28 ENCOUNTER — Telehealth: Payer: Self-pay | Admitting: *Deleted

## 2013-08-28 MED ORDER — ESTROGENS, CONJUGATED 0.625 MG/GM VA CREA: TOPICAL_CREAM | Freq: Every day | VAGINAL | Status: DC

## 2013-08-28 NOTE — Telephone Encounter (Signed)
Patient called to find out about a RX Dr. Katrinka Blazing said she would give her for a hormonal vaginal cream and also that she needs a referral for a mammogram to [facility] in [place]. Let patient know i would tell Dr. Katrinka Blazing and that her RX would be eprescribed to pharmacy and we would make the referral, but she would need to call and schedule that appt because of the way that clinic's protocol is now. (cuts down on missed appts when pt schedules the appt themselves.)

## 2013-09-13 ENCOUNTER — Other Ambulatory Visit: Payer: Self-pay | Admitting: Family Medicine

## 2013-10-28 ENCOUNTER — Ambulatory Visit (INDEPENDENT_AMBULATORY_CARE_PROVIDER_SITE_OTHER): Payer: Federal, State, Local not specified - PPO | Admitting: Family Medicine

## 2013-10-28 ENCOUNTER — Encounter: Payer: Self-pay | Admitting: Family Medicine

## 2013-10-28 VITALS — BP 122/80 | HR 74 | Temp 98.4°F | Resp 16 | Ht 67.0 in | Wt 206.0 lb

## 2013-10-28 DIAGNOSIS — Z0000 Encounter for general adult medical examination without abnormal findings: Secondary | ICD-10-CM

## 2013-10-28 LAB — POCT URINALYSIS DIPSTICK
Blood, UA: NEGATIVE
Glucose, UA: NEGATIVE
Spec Grav, UA: 1.02
Urobilinogen, UA: 0.2

## 2013-10-28 LAB — HEMOGLOBIN A1C: Hgb A1c MFr Bld: 5.9 % — ABNORMAL HIGH (ref <5.7)

## 2013-10-28 LAB — LIPID PANEL
LDL Cholesterol: 150 mg/dL — ABNORMAL HIGH (ref 0–99)
Triglycerides: 147 mg/dL (ref <150)
VLDL: 29 mg/dL (ref 0–40)

## 2013-10-28 MED ORDER — LEVOTHYROXINE SODIUM 112 MCG PO TABS: 112.0000 ug | ORAL_TABLET | Freq: Every day | ORAL | Status: DC

## 2013-10-28 MED ORDER — OMEPRAZOLE 20 MG PO CPDR: 20.0000 mg | DELAYED_RELEASE_CAPSULE | Freq: Every day | ORAL | Status: DC

## 2013-10-28 NOTE — Progress Notes (Signed)
Subjective:    Patient ID: Jane [name], female    DOB: Apr 03, 1961, 52 y.o.   MRN: [id number]  HPI This 52 y.o. female presents for Complete Physical Examination.  Last physical 10/2012. Pap smear 10/2012 WNL HPV negative.  LMP 2005. Mammogram  07/2012.  Due now; needs to schedule appointment at [facility].   Colonoscopy age 50; normal; Jane [name].  Repeat in 10 years. TDAP unknown.   Flu vaccine 08/27/2013. Eye exam 10/2013; +glasses; Jane [name].  No glaucoma or cataracts. Dental exam every six months; 08/2013; Jane [name].   Review of Systems  Constitutional: Positive for diaphoresis.  HENT: Negative.   Eyes: Negative.   Respiratory: Negative.   Cardiovascular: Negative.   Gastrointestinal: Negative.   Endocrine: Negative.   Genitourinary: Negative.   Musculoskeletal: Positive for arthralgias, joint swelling, neck pain and neck stiffness.  Skin: Negative.   Allergic/Immunologic: Positive for environmental allergies.  Neurological: Positive for dizziness.  Hematological: Negative.   Psychiatric/Behavioral: Negative.    Past Medical History  Diagnosis Date  . GERD (gastroesophageal reflux disease)   . HLD (hyperlipidemia)   . MR (mitral regurgitation)     moderate; probably MVP. echo 3/10 at [facility] cardiology with normal EF, moderate MR   . Atypical chest pain     hx   . Arthritis     Knees, feet, elbows , neck  . Fibromyalgia 12/04/2010    [facility] in [place]; Dr. Gwenevere [name]; followed every six months.  . Asthma     mild; assoc with seasonal allergies; uses Albuterol once per year on average.  Marland Kitchen Heart murmur     Mitral valve Prolapse  . Hypothyroidism post radioactive iodine    Hyperthyroidism; now secondary hypothyroidism   Past Surgical History  Procedure Laterality Date  . Knee surgery  7/10  . 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 Inner thigh surgery    . Left foot surgery      for plantar fascitis  . Novascue uterine ablation  2007  . Colonoscopy   12/04/2009    normal; Jane [name]   Allergies  Allergen Reactions  . Celebrex [Celecoxib] Swelling    Developed Facial swelling    History   Social History  . Marital Status: Married    Spouse Name: N/A    Number of Children: N/A  . Years of Education: N/A   Occupational History  . Not on file.   Social History Main Topics  . Smoking status: Former Smoker    Types: Cigarettes  . Smokeless tobacco: Never Used     Comment: smoked 2ppd for 15 years; quit in 1995   . Alcohol Use: No     Comment: social, rare  . Drug Use: No  . Sexual Activity: Yes    Birth Control/ Protection: Post-menopausal   Other Topics Concern  . Not on file   Social History Narrative   Marital status:  Married x 19 years; happily married no abuse, no children.      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       Does not get regular exercise.    Family History  Problem Relation Age of Onset  . Stroke Father 76    CVA  . COPD Father   . Hyperlipidemia Mother   . Heart disease Mother 25    CABG in 64s; AMI age 74  . Hypertension Mother   . Hypertension Sister  Objective:   Physical Exam  Nursing note and vitals reviewed. Constitutional: She is oriented to person, place, and time. She appears well-developed and well-nourished. No distress.  HENT:  Head: Normocephalic and atraumatic.  Right Ear: External ear normal.  Left Ear: External ear normal.  Nose: Nose normal.  Mouth/Throat: Oropharynx is clear and moist.  Eyes: Conjunctivae and EOM are normal. Pupils are equal, round, and reactive to light.  Neck: Normal range of motion and full passive range of motion without pain. Neck supple. No JVD present. Carotid bruit is not present. No thyromegaly present.  Cardiovascular: Normal rate, regular rhythm and normal heart sounds.  Exam reveals no gallop and no friction rub.   No murmur heard. Pulmonary/Chest: Effort normal and breath sounds normal. She has no  wheezes. She has no rales. Right breast exhibits no inverted nipple, no mass, no nipple discharge, no skin change and no tenderness. Left breast exhibits no inverted nipple, no mass, no nipple discharge, no skin change and no tenderness. Breasts are symmetrical.  Abdominal: Soft. Bowel sounds are normal. She exhibits no distension and no mass. There is no tenderness. There is no rebound and no guarding.  Genitourinary: Vagina normal and uterus normal. There is no rash, tenderness or lesion on the right labia. There is no rash, tenderness or lesion on the left labia. Cervix exhibits no motion tenderness. Right adnexum displays no mass, no tenderness and no fullness. Left adnexum displays no mass, no tenderness and no fullness.  Musculoskeletal:       Right shoulder: Normal.       Left shoulder: Normal.       Cervical back: Normal.  Lymphadenopathy:    She has no cervical adenopathy.  Neurological: She is alert and oriented to person, place, and time. She has normal reflexes. No cranial nerve deficit. She exhibits normal muscle tone. Coordination normal.  Skin: Skin is warm and dry. No rash noted. She is not diaphoretic. No erythema. No pallor.  Psychiatric: She has a normal mood and affect. Her behavior is normal. Judgment and thought content normal.          Assessment & Plan:  Routine general medical examination at a health care facility - Plan: POCT urinalysis dipstick, Lipid panel, TSH, T4, Free, Hemoglobin A1c, CANCELED: POCT glycosylated hemoglobin (Hb A1C), CANCELED: POCT UA - Microscopic Only  1. Complete Physical Examination:  Anticipatory guidance --- weight loss, exercise. Pap smear UTD 2013.  Refer for mammogram.  Colonoscopy UTD.  Immunizations likely UTD: will clarify date of last TDAP.  Obtain labs.    Meds ordered this encounter  Medications  . predniSONE (DELTASONE) 5 MG tablet    Sig: Take 5 mg by mouth daily with breakfast.  . omeprazole (PRILOSEC) 20 MG capsule    Sig:  Take 1 capsule (20 mg total) by mouth daily.    Dispense:  90 capsule    Refill:  3  . levothyroxine (SYNTHROID, LEVOTHROID) 112 MCG tablet    Sig: Take 1 tablet (112 mcg total) by mouth daily before breakfast.    Dispense:  90 tablet    Refill:  3   Nilda Simmer, M.D.  [facility] [address] [place], [place]  [address] [phone] phone [phone] fax

## 2013-11-04 ENCOUNTER — Ambulatory Visit: Payer: Self-pay | Admitting: Family Medicine

## 2013-12-17 ENCOUNTER — Encounter: Payer: Self-pay | Admitting: Family Medicine

## 2014-06-29 ENCOUNTER — Telehealth: Payer: Self-pay | Admitting: *Deleted

## 2014-06-29 NOTE — Telephone Encounter (Signed)
Pt called stating she had a bad bruise that was dark in color for about two wks around her knee.  Pt stated she got up that morning and went to the store, after she was climbing in her husband truck felt her knee burning looked down and it was all knotted.  She said it looks like maybe a blood vessel broken or something.   She put ice on it, place still hurts today after two wks and still bruise.   Normally sees Dr. Katrinka BlazingSmith.   Pt wants to know if she should come in or will it be all right.  She also took pictures on the knee.  Pt # [phone]

## 2014-06-29 NOTE — Telephone Encounter (Signed)
Spoke to pt. i advised her to come please come in to be seen as we can not diagnose this over the phone, i stressed the importance of having this evaluated. She stated she would wait until Thursday to see dr Katrinka Blazingsmith, i explained to her we have very capable doctors and any of them would be more than glad to see her. She stated she would possibly come in tomorrow fore eval.

## 2014-07-02 ENCOUNTER — Ambulatory Visit (INDEPENDENT_AMBULATORY_CARE_PROVIDER_SITE_OTHER): Payer: Federal, State, Local not specified - PPO | Admitting: Family Medicine

## 2014-07-02 VITALS — BP 144/82 | HR 68 | Temp 98.1°F | Resp 16 | Ht 67.0 in | Wt 210.4 lb

## 2014-07-02 DIAGNOSIS — R110 Nausea: Secondary | ICD-10-CM

## 2014-07-02 DIAGNOSIS — E039 Hypothyroidism, unspecified: Secondary | ICD-10-CM

## 2014-07-02 DIAGNOSIS — T148XXA Other injury of unspecified body region, initial encounter: Secondary | ICD-10-CM

## 2014-07-02 DIAGNOSIS — M7989 Other specified soft tissue disorders: Secondary | ICD-10-CM

## 2014-07-02 LAB — POCT CBC
Granulocyte percent: 65.5 %G (ref 37–80)
HCT, POC: 40.7 % (ref 37.7–47.9)
HEMOGLOBIN: 13.4 g/dL (ref 12.2–16.2)
LYMPH, POC: 2.1 (ref 0.6–3.4)
MCH: 28.4 pg (ref 27–31.2)
MCHC: 33 g/dL (ref 31.8–35.4)
MCV: 86.1 fL (ref 80–97)
MID (cbc): 0.3 (ref 0–0.9)
MPV: 7.8 fL (ref 0–99.8)
POC Granulocyte: 4.7 (ref 2–6.9)
POC LYMPH PERCENT: 29.7 %L (ref 10–50)
POC MID %: 4.8 %M (ref 0–12)
Platelet Count, POC: 260 10*3/uL (ref 142–424)
RBC: 4.72 M/uL (ref 4.04–5.48)
RDW, POC: 14.1 %
WBC: 7.1 10*3/uL (ref 4.6–10.2)

## 2014-07-02 LAB — POCT URINALYSIS DIPSTICK
BILIRUBIN UA: NEGATIVE
GLUCOSE UA: NEGATIVE
Leukocytes, UA: NEGATIVE
Nitrite, UA: NEGATIVE
PH UA: 6
SPEC GRAV UA: 1.02
Urobilinogen, UA: 0.2

## 2014-07-02 LAB — POCT UA - MICROSCOPIC ONLY
BACTERIA, U MICROSCOPIC: NEGATIVE
CRYSTALS, UR, HPF, POC: NEGATIVE
Casts, Ur, LPF, POC: NEGATIVE
WBC, Ur, HPF, POC: NEGATIVE
Yeast, UA: NEGATIVE

## 2014-07-02 MED ORDER — ONDANSETRON 8 MG PO TBDP: 8.0000 mg | ORAL_TABLET | Freq: Three times a day (TID) | ORAL | Status: DC | PRN

## 2014-07-02 NOTE — Progress Notes (Signed)
Subjective:    Patient ID: Jane [name], female    DOB: 02-24-1961, 53 y.o.   MRN: [id number]  07/02/2014  Follow-up, Leg Pain and Nausea   HPI This 53 y.o. female presents for evaluation of bruising. July 17, driving husband's truck.  Looked down to R lateral leg; had whelps along R lateral leg.  Swollen; looked like broken blood vessel.  Unable to touch them due to severe pain.  Then bruising developed. No trauma that recalls.  Tried to reinact night.  Had knots along R lateral calf.  No itching; stinging.  No redness; no bruising; thought was a blood vessel but took a while for bruising to develop; bruising developed four days later.  Painful to walk for a few days; then now only tender to palpation.  Husband was worried about blood clot.    Nausea: onset 06/06/14; no vomiting; tried to vomit but cannot.  No diarrhea; called Dr. Earnest Conroy office but no one called.  No heartburn or indigestion; no burning; taking Omeprazole daily.  No diarrhea; a little constipated lately; having b.m. Qod; no bloody or black stools.  No urinary symptoms.  Had a good bowel movement today.  No nausea today.  No urinary symptoms.  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     Review of Systems  Constitutional: Negative for fever, chills, diaphoresis and fatigue.  Respiratory: Negative for shortness of breath.   Cardiovascular: Positive for leg swelling. Negative for chest pain and palpitations.  Gastrointestinal: Positive for nausea and constipation. Negative for vomiting, abdominal pain, diarrhea, blood in stool, abdominal distention, anal bleeding and rectal pain.  Genitourinary: Negative for dysuria, urgency, frequency, hematuria, flank pain and genital sores.  Skin: Positive for color change. Negative for rash.  Neurological: Positive for headaches.  Hematological: Negative for adenopathy. Bruises/bleeds easily.    Past Medical History  Diagnosis Date  . GERD (gastroesophageal reflux  disease)   . HLD (hyperlipidemia)   . MR (mitral regurgitation)     moderate; probably MVP. echo 3/10 at [facility] cardiology with normal EF, moderate MR   . Atypical chest pain     hx   . Arthritis     Knees, feet, elbows , neck  . Fibromyalgia 12/04/2010    [facility] in [place]; Dr. Gwenevere Ghazi; followed every six months.  . Asthma     mild; assoc with seasonal allergies; uses Albuterol once per year on average.  Marland Kitchen Heart murmur     Mitral valve Prolapse  . Hypothyroidism post radioactive iodine    Hyperthyroidism; now secondary hypothyroidism   Past Surgical History  Procedure Laterality Date  . Knee surgery  7/10  . 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 Inner thigh surgery    . Left foot surgery      for plantar fascitis  . Novascue uterine ablation  2007  . Colonoscopy  12/04/2009    normal; Iftikhar   Allergies  Allergen Reactions  . Celebrex [Celecoxib] Swelling    Developed Facial swelling    Current Outpatient Prescriptions  Medication Sig Dispense Refill  . albuterol (PROVENTIL HFA;VENTOLIN HFA) 108 (90 BASE) MCG/ACT inhaler Inhale 2 puffs into the lungs every 4 (four) hours as needed for wheezing (cough, shortness of breath or wheezing.).  1 Inhaler  1  . levothyroxine (SYNTHROID, LEVOTHROID) 112 MCG tablet Take 1 tablet (112 mcg total) by mouth daily before breakfast.  90 tablet  3  . loratadine (CLARITIN)  10 MG tablet Take 10 mg by mouth daily.        Marland Kitchen. omeprazole (PRILOSEC) 20 MG capsule Take 1 capsule (20 mg total) by mouth daily.  90 capsule  3  . DULoxetine (CYMBALTA) 60 MG capsule Take 120 mg by mouth daily.      Marland Kitchen. gabapentin (NEURONTIN) 100 MG capsule Take 100 mg by mouth 3 (three) times daily.      . nabumetone (RELAFEN) 500 MG tablet Take 500 mg by mouth 2 (two) times daily.      . ondansetron (ZOFRAN-ODT) 8 MG disintegrating tablet Take 1 tablet (8 mg total) by mouth every 8 (eight) hours as needed for nausea.  21 tablet  0   No current  facility-administered medications for this visit.   History   Social History  . Marital Status: Married    Spouse Name: N/A    Number of Children: N/A  . Years of Education: N/A   Occupational History  . Not on file.   Social History Main Topics  . Smoking status: Former Smoker    Types: Cigarettes  . Smokeless tobacco: Never Used     Comment: smoked 2ppd for 15 years; quit in 1995   . Alcohol Use: No     Comment: social, rare  . Drug Use: No  . Sexual Activity: Yes    Birth Control/ Protection: Post-menopausal   Other Topics Concern  . Not on file   Social History Narrative   Marital status:  Married x 19 years; happily married no abuse, no children.      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       Does not get regular exercise.    Family History  Problem Relation Age of Onset  . Stroke Father 5569    CVA  . COPD Father   . Hyperlipidemia Mother   . Heart disease Mother 2155    CABG in 1850s; AMI age 54  . Hypertension Mother   . Hypertension Sister        Objective:    BP 144/82  Pulse 68  Temp(Src) 98.1 F (36.7 C) (Oral)  Resp 16  Ht 5\' 7"  (1.702 m)  Wt 210 lb 6.4 oz (95.437 kg)  BMI 32.95 kg/m2  SpO2 100% Physical Exam  Constitutional: She is oriented to person, place, and time. She appears well-developed and well-nourished. No distress.  HENT:  Head: Normocephalic and atraumatic.  Eyes: Conjunctivae and EOM are normal. Pupils are equal, round, and reactive to light.  Neck: Normal range of motion. Neck supple. Carotid bruit is not present. No thyromegaly present.  Cardiovascular: Normal rate, regular rhythm, normal heart sounds and intact distal pulses.  Exam reveals no gallop and no friction rub.   No murmur heard. Hommen's negative RLE.  R calf proximal lateral with slight warmth.  No palpable cords.  Pulmonary/Chest: Effort normal and breath sounds normal. She has no wheezes. She has no rales.  Abdominal: Soft. Bowel  sounds are normal. She exhibits no distension and no mass. There is tenderness in the epigastric area. There is no rebound and no guarding.  Lymphadenopathy:    She has no cervical adenopathy.  Neurological: She is alert and oriented to person, place, and time. No cranial nerve deficit.  Skin: Skin is warm and dry. No rash noted. She is not diaphoretic. No erythema. No pallor.  Psychiatric: She has a normal mood and affect. Her  behavior is normal.   Results for orders placed in visit on 07/02/14  POCT CBC      Result Value Ref Range   WBC 7.1  4.6 - 10.2 K/uL   Lymph, poc 2.1  0.6 - 3.4   POC LYMPH PERCENT 29.7  10 - 50 %L   MID (cbc) 0.3  0 - 0.9   POC MID % 4.8  0 - 12 %M   POC Granulocyte 4.7  2 - 6.9   Granulocyte percent 65.5  37 - 80 %G   RBC 4.72  4.04 - 5.48 M/uL   Hemoglobin 13.4  12.2 - 16.2 g/dL   HCT, POC 16.1  09.6 - 47.9 %   MCV 86.1  80 - 97 fL   MCH, POC 28.4  27 - 31.2 pg   MCHC 33.0  31.8 - 35.4 g/dL   RDW, POC 04.5     Platelet Count, POC 260  142 - 424 K/uL   MPV 7.8  0 - 99.8 fL  POCT URINALYSIS DIPSTICK      Result Value Ref Range   Color, UA yellow     Clarity, UA clear     Glucose, UA neg     Bilirubin, UA neg     Ketones, UA trace     Spec Grav, UA 1.020     Blood, UA small     pH, UA 6.0     Protein, UA trace     Urobilinogen, UA 0.2     Nitrite, UA neg     Leukocytes, UA Negative    POCT UA - MICROSCOPIC ONLY      Result Value Ref Range   WBC, Ur, HPF, POC neg     RBC, urine, microscopic 0-2     Bacteria, U Microscopic neg     Mucus, UA large     Epithelial cells, urine per micros 0-3     Crystals, Ur, HPF, POC neg     Casts, Ur, LPF, POC neg     Yeast, UA neg         Assessment & Plan:   1. Nausea alone   2. Right leg swelling   3. Bruising   4. Unspecified hypothyroidism    1. R leg swelling lateral proximal calf:  New onset; now improved with associated ecchymoses.  CBC stable; obtain RLE doppler.   2.  Bruising:  New.  CBC  stable; if persists, obtain further labs. 3.  Nausea:  New.  With mild epigastric tenderness; increase Omeprazole to bid dosing for two weeks; rx for Zofran provided.  If no improvement in upcoming two weeks, contact GI for appointment. 4.  Hypothyroidism: stable; obtain labs with current symptoms.  Meds ordered this encounter  Medications  . ondansetron (ZOFRAN-ODT) 8 MG disintegrating tablet    Sig: Take 1 tablet (8 mg total) by mouth every 8 (eight) hours as needed for nausea.    Dispense:  21 tablet    Refill:  0    No Follow-up on file.   Nilda Simmer, M.D.  [facility] [address] [place], [place]  [address] [phone] phone [phone] fax

## 2014-07-03 ENCOUNTER — Telehealth: Payer: Self-pay | Admitting: Family Medicine

## 2014-07-03 ENCOUNTER — Telehealth: Payer: Self-pay

## 2014-07-03 ENCOUNTER — Ambulatory Visit: Payer: Self-pay | Admitting: Family Medicine

## 2014-07-03 LAB — COMPREHENSIVE METABOLIC PANEL
ALBUMIN: 4.4 g/dL (ref 3.5–5.2)
ALK PHOS: 72 U/L (ref 39–117)
ALT: 24 U/L (ref 0–35)
AST: 21 U/L (ref 0–37)
BILIRUBIN TOTAL: 0.2 mg/dL (ref 0.2–1.2)
BUN: 18 mg/dL (ref 6–23)
CO2: 27 mEq/L (ref 19–32)
CREATININE: 0.77 mg/dL (ref 0.50–1.10)
Calcium: 9.2 mg/dL (ref 8.4–10.5)
Chloride: 104 mEq/L (ref 96–112)
GLUCOSE: 83 mg/dL (ref 70–99)
POTASSIUM: 4.2 meq/L (ref 3.5–5.3)
Sodium: 140 mEq/L (ref 135–145)
Total Protein: 7.2 g/dL (ref 6.0–8.3)

## 2014-07-03 LAB — TSH: TSH: 12.26 u[IU]/mL — ABNORMAL HIGH (ref 0.350–4.500)

## 2014-07-03 NOTE — Telephone Encounter (Signed)
Patient notified and voiced understanding. She was wanting to know lab results. Please advise

## 2014-07-03 NOTE — Telephone Encounter (Signed)
Pt was seen last night by Dr. Katrinka BlazingSmith. She is under the impression that she was being referred out of our office to further treatment but she has not heard from anyone. I don't see any record of a referral in her chart.Marland Kitchen.Marland Kitchen.Please advise pt what the next step is!

## 2014-07-03 NOTE — Telephone Encounter (Signed)
Doppler was ordered for pt- Referrals is working on scheduling. Pt would like to go to [facility] for this study she lives in [place].

## 2014-07-03 NOTE — Telephone Encounter (Signed)
Amber from Vascular lab called with preliminary report Venous Duplex. Negative DVT. Small lipoma 1 cm thigh. Results verbally given to Dr. Katrinka BlazingSmith. Called patient and left message to return call

## 2014-07-13 ENCOUNTER — Telehealth: Payer: Self-pay

## 2014-07-13 NOTE — Telephone Encounter (Signed)
Please advise lab results to call pt back.

## 2014-07-13 NOTE — Telephone Encounter (Signed)
Pt requesting call back re the remainder of their labs  Best phone [phone]

## 2014-07-16 MED ORDER — LEVOTHYROXINE SODIUM 125 MCG PO TABS: 125.0000 ug | ORAL_TABLET | Freq: Every day | ORAL | Status: DC

## 2014-07-16 NOTE — Telephone Encounter (Signed)
Lab results sent to Lab Pool and patient was contacted/advised.

## 2014-07-16 NOTE — Addendum Note (Signed)
Addended by: Ethelda ChickSMITH, KRISTI M on: 07/16/2014 10:29 AM   Modules accepted: Orders

## 2014-07-21 ENCOUNTER — Telehealth: Payer: Self-pay

## 2014-07-21 NOTE — Telephone Encounter (Signed)
FYI Dr Katrinka BlazingSmith Pt does not take depression medication- med list shows Cymbalta but pt denies taking it. She feels like her emotions are going "haywire". Her moods have been changing drastically. She feels like she wants to cry then is angry. She is very tired. She states this is not an emergency. She is not in danger of hurting herself or anyone else. She does not feel "right". Advised pt to RTC but she does not want to come in and get around the sick patients waiting. Tried to fit her in at 104 on 8/19- no open slots. Scheduled appt for 8/24 at 2:15pm.   Pt has been on medication in the past. She is wondering if she could be prescribed these again or if she should try something different. Pt states she can not take Lyrica and Celebrex due to the side effects.

## 2014-07-21 NOTE — Telephone Encounter (Signed)
Has she taken anything for anxiety in the past that has worked well for her?  What all has she tried in the past?

## 2014-07-21 NOTE — Telephone Encounter (Signed)
Patient is unsure of what is going on with.  She is extremely emotional, crys aloud.  Quick to get agitated.  Tired.    [phone]

## 2014-07-23 NOTE — Telephone Encounter (Signed)
Spoke to pt she has been doing ok. She is states that today is the best day she has had in a long while.  She tried Cymbalta in the past but she did not like the way it made her feel when she took it with Lyrica.  She said this is not an emergency right now. She is coming in for an appt on Monday with Dr. Katrinka BlazingSmith and will discuss then.

## 2014-07-23 NOTE — Telephone Encounter (Signed)
Noted and agree to wait until Monday to discuss further.

## 2014-07-27 ENCOUNTER — Encounter: Payer: Self-pay | Admitting: Family Medicine

## 2014-07-27 ENCOUNTER — Ambulatory Visit (INDEPENDENT_AMBULATORY_CARE_PROVIDER_SITE_OTHER): Payer: Federal, State, Local not specified - PPO | Admitting: Family Medicine

## 2014-07-27 VITALS — BP 126/76 | HR 94 | Temp 98.8°F | Resp 16 | Ht 67.0 in | Wt 212.4 lb

## 2014-07-27 DIAGNOSIS — M797 Fibromyalgia: Secondary | ICD-10-CM

## 2014-07-27 DIAGNOSIS — Z23 Encounter for immunization: Secondary | ICD-10-CM

## 2014-07-27 DIAGNOSIS — E890 Postprocedural hypothyroidism: Secondary | ICD-10-CM

## 2014-07-27 DIAGNOSIS — IMO0001 Reserved for inherently not codable concepts without codable children: Secondary | ICD-10-CM

## 2014-07-27 DIAGNOSIS — M1710 Unilateral primary osteoarthritis, unspecified knee: Secondary | ICD-10-CM

## 2014-07-27 DIAGNOSIS — M170 Bilateral primary osteoarthritis of knee: Secondary | ICD-10-CM

## 2014-07-27 DIAGNOSIS — F430 Acute stress reaction: Secondary | ICD-10-CM

## 2014-07-27 NOTE — Progress Notes (Signed)
Subjective:    Patient ID: Jane [name], female    DOB: Sep 19, 1961, 53 y.o.   Jane [name]  07/27/2014  Depression   HPI This 53 y.o. female presents for evaluation of emotional stress.  Onset two weeks ago.  Excessive crying two weeks ago.  For the past three days, feeling a little better.  Has been comfort eating.  Agitated. Moody.  Ill.  Felt secondary to Lyrica; stopped Lyrica two months ago.  Brother in law died one year ago; still struggling with his death; died very young and left two children behind; sister not coping well; pt trying to be good support to nephew and niece while mother is struggling.  Rheumatologist prescribed Klonopin one month ago; pt forgot about rx and has not taken it.  Has not tolerated Cymbalta or Effexor which were both prescribed by rheumatologist for fibromyalgia.  No other medications for depression or anxiety.  No previous therapy but finally agreed to therapy with rheumatologist; has appointment in October.  Relationship with husband is good.    Fibromyalgia: s/p evaluation by rheumatology today.  Recommended taking Tramadol for pain; also to take Diclofenac.  Walks five miles per day; this has been hard in the heat.  Exhausted at night.  Has good and bad days regarding chronic pain.  Tries to not take anything for pain but this results in some really bad and painful days.  Hypothyroidism:  Increased thyroid medication at last visit.  TSH was 12.0.    S/p flu vaccine today.    OA knee R: did not give steroid injection today; evaluated for Baker's cyst. Rx for NSAID provided.   Nausea:  Venlafaxine caused the nausea.  Rheumatologist started Venlafaxine.  Stopped tea and did not help.  Stopped Velnafaxine and improved; rechallenged and nausea returned.  Rheumatologist prescribed Clonazepam.  Tried Cymbalta; Lyrica, Venlafaxine.  Not sure if has taken anything for depression.  No previous Prozac, Zoloft, Celexa, Lexapro.    Review of Systems    Constitutional: Positive for fatigue. Negative for fever, chills and diaphoresis.  Respiratory: Negative for shortness of breath.   Cardiovascular: Negative for chest pain, palpitations and leg swelling.  Gastrointestinal: Negative for nausea, vomiting, abdominal pain, diarrhea and constipation.  Genitourinary: Negative for dysuria, urgency, frequency, flank pain and pelvic pain.  Musculoskeletal: Positive for arthralgias, joint swelling and myalgias.  Skin: Negative for rash.  Neurological: Negative for dizziness, tremors, seizures, syncope, speech difficulty, weakness, numbness and headaches.  Psychiatric/Behavioral: Positive for dysphoric mood. Negative for suicidal ideas, sleep disturbance and self-injury. The patient is nervous/anxious.     Past Medical History  Diagnosis Date  . GERD (gastroesophageal reflux disease)   . HLD (hyperlipidemia)   . MR (mitral regurgitation)     moderate; probably MVP. echo 3/10 at [facility] cardiology with normal EF, moderate MR   . Atypical chest pain     hx   . Arthritis     Knees, feet, elbows , neck  . Fibromyalgia 12/04/2010    [facility] in [place]; Dr. Gwenevere Ghazi; followed every six months.  . Asthma     mild; assoc with seasonal allergies; uses Albuterol once per year on average.  Marland Kitchen Heart murmur     Mitral valve Prolapse  . Hypothyroidism post radioactive iodine    Hyperthyroidism; now secondary hypothyroidism   Past Surgical History  Procedure Laterality Date  . Knee surgery  7/10  . 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 Inner thigh  surgery    . Left foot surgery      for plantar fascitis  . Novascue uterine ablation  2007  . Colonoscopy  12/04/2009    normal; Iftikhar   Allergies  Allergen Reactions  . Celebrex [Celecoxib] Swelling    Developed Facial swelling    Current Outpatient Prescriptions  Medication Sig Dispense Refill  . albuterol (PROVENTIL HFA;VENTOLIN HFA) 108 (90 BASE) MCG/ACT inhaler Inhale 2 puffs  into the lungs every 4 (four) hours as needed for wheezing (cough, shortness of breath or wheezing.).  1 Inhaler  1  . levothyroxine (SYNTHROID, LEVOTHROID) 125 MCG tablet Take 1 tablet (125 mcg total) by mouth daily before breakfast.  90 tablet  3  . loratadine (CLARITIN) 10 MG tablet Take 10 mg by mouth daily.        Marland Kitchen omeprazole (PRILOSEC) 20 MG capsule Take 1 capsule (20 mg total) by mouth daily.  90 capsule  3  . DULoxetine (CYMBALTA) 60 MG capsule Take 120 mg by mouth daily.      Marland Kitchen gabapentin (NEURONTIN) 100 MG capsule Take 100 mg by mouth 3 (three) times daily.      . nabumetone (RELAFEN) 500 MG tablet Take 500 mg by mouth 2 (two) times daily.      . ondansetron (ZOFRAN-ODT) 8 MG disintegrating tablet Take 1 tablet (8 mg total) by mouth every 8 (eight) hours as needed for nausea.  21 tablet  0   No current facility-administered medications for this visit.   History   Social History  . Marital Status: Married    Spouse Name: N/A    Number of Children: N/A  . Years of Education: N/A   Occupational History  . Not on file.   Social History Main Topics  . Smoking status: Former Smoker    Types: Cigarettes  . Smokeless tobacco: Never Used     Comment: smoked 2ppd for 15 years; quit in 1995   . Alcohol Use: No     Comment: social, rare  . Drug Use: No  . Sexual Activity: Yes    Birth Control/ Protection: Post-menopausal   Other Topics Concern  . Not on file   Social History Narrative   Marital status:  Married x 19 years; happily married no abuse, no children.      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       Does not get regular exercise.    Family History  Problem Relation Age of Onset  . Stroke Father 49    CVA  . COPD Father   . Hyperlipidemia Mother   . Heart disease Mother 68    CABG in 16s; AMI age 22  . Hypertension Mother   . Hypertension Sister        Objective:    BP 126/76  Pulse 94  Temp(Src) 98.8 F (37.1 C)  (Oral)  Resp 16  Ht  (1.702 m)  Wt 212 lb 6.4 oz (96.344 kg)  BMI 33.26 kg/m2  SpO2 98% Physical Exam  Constitutional: She is oriented to person, place, and time. She appears well-developed and well-nourished. No distress.  HENT:  Head: Normocephalic and atraumatic.  Eyes: Conjunctivae and EOM are normal. Pupils are equal, round, and reactive to light.  Neck: Normal range of motion. Neck supple. Carotid bruit is not present. No thyromegaly present.  Cardiovascular: Normal rate, regular rhythm, normal heart sounds and intact distal pulses.  Exam  reveals no gallop and no friction rub.   No murmur heard. Pulmonary/Chest: Effort normal and breath sounds normal. She has no wheezes. She has no rales.  Abdominal: Soft. Bowel sounds are normal. She exhibits no distension and no mass. There is no tenderness. There is no rebound and no guarding.  Lymphadenopathy:    She has no cervical adenopathy.  Neurological: She is alert and oriented to person, place, and time. No cranial nerve deficit.  Skin: Skin is warm and dry. No rash noted. She is not diaphoretic. No erythema. No pallor.  Psychiatric: She has a normal mood and affect. Her behavior is normal. Judgment and thought content normal.  Tearful.   Results for orders placed in visit on 07/02/14  COMPREHENSIVE METABOLIC PANEL      Result Value Ref Range   Sodium 140  135 - 145 mEq/L   Potassium 4.2  3.5 - 5.3 mEq/L   Chloride 104  96 - 112 mEq/L   CO2 27  19 - 32 mEq/L   Glucose, Bld 83  70 - 99 mg/dL   BUN 18  6 - 23 mg/dL   Creat 5.28  4.13 - 2.44 mg/dL   Total Bilirubin 0.2  0.2 - 1.2 mg/dL   Alkaline Phosphatase 72  39 - 117 U/L   AST 21  0 - 37 U/L   ALT 24  0 - 35 U/L   Total Protein 7.2  6.0 - 8.3 g/dL   Albumin 4.4  3.5 - 5.2 g/dL   Calcium 9.2  8.4 - 01.0 mg/dL  TSH      Result Value Ref Range   TSH 12.260 (*) 0.350 - 4.500 uIU/mL  POCT CBC      Result Value Ref Range   WBC 7.1  4.6 - 10.2 K/uL   Lymph, poc 2.1  0.6  - 3.4   POC LYMPH PERCENT 29.7  10 - 50 %L   MID (cbc) 0.3  0 - 0.9   POC MID % 4.8  0 - 12 %M   POC Granulocyte 4.7  2 - 6.9   Granulocyte percent 65.5  37 - 80 %G   RBC 4.72  4.04 - 5.48 M/uL   Hemoglobin 13.4  12.2 - 16.2 g/dL   HCT, POC 27.2  53.6 - 47.9 %   MCV 86.1  80 - 97 fL   MCH, POC 28.4  27 - 31.2 pg   MCHC 33.0  31.8 - 35.4 g/dL   RDW, POC 64.4     Platelet Count, POC 260  142 - 424 K/uL   MPV 7.8  0 - 99.8 fL  POCT URINALYSIS DIPSTICK      Result Value Ref Range   Color, UA yellow     Clarity, UA clear     Glucose, UA neg     Bilirubin, UA neg     Ketones, UA trace     Spec Grav, UA 1.020     Blood, UA small     pH, UA 6.0     Protein, UA trace     Urobilinogen, UA 0.2     Nitrite, UA neg     Leukocytes, UA Negative    POCT UA - MICROSCOPIC ONLY      Result Value Ref Range   WBC, Ur, HPF, POC neg     RBC, urine, microscopic 0-2     Bacteria, U Microscopic neg     Mucus, UA large  Epithelial cells, urine per micros 0-3     Crystals, Ur, HPF, POC neg     Casts, Ur, LPF, POC neg     Yeast, UA neg     INFLUENZA VACCINE ADMINISTERED.    Assessment & Plan:   1. Need for prophylactic vaccination and inoculation against influenza   2. Acute stress reaction causing mixed disturbance of emotion and conduct   3. Primary osteoarthritis of both knees   4. Fibromyalgia   5. Hypothyroidism following radioiodine therapy    1. Acute stress reaction:  New.  Multiple family stressors in past year; chronic pain from OA and fibromyalgia also contributing. Has not tolerated Cymbalta, Effexor in the past.  Agree with Klonopin PRN and outpatient psychotherapy.  If develops persistent daily symptoms, will warrant SSRI.  Close follow--up.   2.  OA knees with R baker's cyst: Persistent; s/p ortho consultation today. 3.  Fibromyalgia: persistent; s/p rheumatology consultation today; intolerant to Cymbalta, Lyrica, Effexor.  Highly supportive of psychotherapy to help cope with  daily pain. 4.  Hypothyroidism: uncontrolled; tolerating increased dose of Synthroid; RTC one month for repeat TSH. 5. S/p Flu vaccine. 6. Nausea: resolved with cessation of Effexor.   No orders of the defined types were placed in this encounter.    Return in about 4 weeks (around 08/24/2014) for recheck.    Nilda Simmer, M.D.  [facility] [address] [place], [place]  [address] [phone] phone [phone] fax

## 2014-08-24 ENCOUNTER — Ambulatory Visit: Payer: Federal, State, Local not specified - PPO | Admitting: Family Medicine

## 2014-11-09 ENCOUNTER — Ambulatory Visit (INDEPENDENT_AMBULATORY_CARE_PROVIDER_SITE_OTHER): Payer: Federal, State, Local not specified - PPO | Admitting: Family Medicine

## 2014-11-09 ENCOUNTER — Encounter: Payer: Self-pay | Admitting: Family Medicine

## 2014-11-09 VITALS — BP 142/84 | HR 75 | Temp 98.7°F | Resp 16 | Ht 67.5 in | Wt 213.2 lb

## 2014-11-09 DIAGNOSIS — R7302 Impaired glucose tolerance (oral): Secondary | ICD-10-CM

## 2014-11-09 DIAGNOSIS — E785 Hyperlipidemia, unspecified: Secondary | ICD-10-CM

## 2014-11-09 DIAGNOSIS — Z23 Encounter for immunization: Secondary | ICD-10-CM

## 2014-11-09 DIAGNOSIS — Z0000 Encounter for general adult medical examination without abnormal findings: Secondary | ICD-10-CM

## 2014-11-09 DIAGNOSIS — F411 Generalized anxiety disorder: Secondary | ICD-10-CM

## 2014-11-09 DIAGNOSIS — E890 Postprocedural hypothyroidism: Secondary | ICD-10-CM

## 2014-11-09 LAB — CBC WITH DIFFERENTIAL/PLATELET
BASOS ABS: 0 10*3/uL (ref 0.0–0.1)
Basophils Relative: 1 % (ref 0–1)
EOS ABS: 0.2 10*3/uL (ref 0.0–0.7)
Eosinophils Relative: 4 % (ref 0–5)
HCT: 41.7 % (ref 36.0–46.0)
Hemoglobin: 13.9 g/dL (ref 12.0–15.0)
Lymphocytes Relative: 25 % (ref 12–46)
Lymphs Abs: 1.2 10*3/uL (ref 0.7–4.0)
MCH: 28.1 pg (ref 26.0–34.0)
MCHC: 33.3 g/dL (ref 30.0–36.0)
MCV: 84.4 fL (ref 78.0–100.0)
MPV: 9.9 fL (ref 9.4–12.4)
Monocytes Absolute: 0.3 10*3/uL (ref 0.1–1.0)
Monocytes Relative: 7 % (ref 3–12)
Neutro Abs: 3 10*3/uL (ref 1.7–7.7)
Neutrophils Relative %: 63 % (ref 43–77)
PLATELETS: 256 10*3/uL (ref 150–400)
RBC: 4.94 MIL/uL (ref 3.87–5.11)
RDW: 13.7 % (ref 11.5–15.5)
WBC: 4.8 10*3/uL (ref 4.0–10.5)

## 2014-11-09 LAB — HEMOGLOBIN A1C
Hgb A1c MFr Bld: 6.1 % — ABNORMAL HIGH (ref <5.7)
Mean Plasma Glucose: 128 mg/dL — ABNORMAL HIGH (ref <117)

## 2014-11-09 LAB — COMPLETE METABOLIC PANEL WITH GFR
ALBUMIN: 4.3 g/dL (ref 3.5–5.2)
ALT: 20 U/L (ref 0–35)
AST: 18 U/L (ref 0–37)
Alkaline Phosphatase: 79 U/L (ref 39–117)
BUN: 20 mg/dL (ref 6–23)
CO2: 27 mEq/L (ref 19–32)
Calcium: 9.6 mg/dL (ref 8.4–10.5)
Chloride: 104 mEq/L (ref 96–112)
Creat: 0.74 mg/dL (ref 0.50–1.10)
GFR, Est African American: >89 mL/min
Glucose, Bld: 100 mg/dL — ABNORMAL HIGH (ref 70–99)
Potassium: 4.6 mEq/L (ref 3.5–5.3)
SODIUM: 138 meq/L (ref 135–145)
Total Bilirubin: 0.3 mg/dL (ref 0.2–1.2)
Total Protein: 7 g/dL (ref 6.0–8.3)

## 2014-11-09 LAB — LIPID PANEL
CHOL/HDL RATIO: 4.3 ratio
CHOLESTEROL: 187 mg/dL (ref 0–200)
HDL: 44 mg/dL (ref >39)
LDL Cholesterol: 124 mg/dL — ABNORMAL HIGH (ref 0–99)
Triglycerides: 96 mg/dL (ref <150)
VLDL: 19 mg/dL (ref 0–40)

## 2014-11-09 LAB — POCT URINALYSIS DIPSTICK
Bilirubin, UA: NEGATIVE
Glucose, UA: NEGATIVE
Ketones, UA: NEGATIVE
Leukocytes, UA: NEGATIVE
NITRITE UA: NEGATIVE
PROTEIN UA: NEGATIVE
UROBILINOGEN UA: 0.2
pH, UA: 6.5

## 2014-11-09 LAB — T4, FREE: Free T4: 1 ng/dL (ref 0.80–1.80)

## 2014-11-09 LAB — TSH: TSH: 1.142 u[IU]/mL (ref 0.350–4.500)

## 2014-11-09 MED ORDER — OMEPRAZOLE 20 MG PO CPDR: 20.0000 mg | DELAYED_RELEASE_CAPSULE | Freq: Every day | ORAL | Status: DC

## 2014-11-09 MED ORDER — LEVOTHYROXINE SODIUM 125 MCG PO TABS: 125.0000 ug | ORAL_TABLET | Freq: Every day | ORAL | Status: DC

## 2014-11-09 NOTE — Progress Notes (Signed)
Subjective:    Patient ID: Jane [name], female    DOB: Mar 01, 1961, 53 y.o.   MRN: [id number]  11/09/2014  Annual Exam   HPI This 53 y.o. female presents for Complete Physical Examination.  Last physical: 10/28/2013 Pap smear:  10/2012 WNL HPV negative.  LMP 2005. Mammogram:  11/04/2013 [place] Colonoscopy: 2011. Iftikhar.  Normal. Repeat 10 years. Bone density: 15 years ago.  Moriyati. TDAP:  Not sure. Pneumovax: Influenza: 07/27/2014 Eye exam:  Overdue; +glasses; no g/c. Dental exam:  Every six months.   R periauricular: painful with swallowing and especially at night.  Thinks PND due to allergies.  R ear with pain and behind ear.  No fever.  No medications for allergies.    Lower back pain: lower back across; movement helps.  No radiation into legs.  Started intermittently for one week.  No medications for pain.    Anxiety and depression: rheumatology obtained Vitamin D level and low. Started on weekly Vitamin D.  Taking clonazepam three times per week when off.  Seeing psychiatry.  Idle time causes anxiety; usually at kitchen sink.  Gets overwhelmed.    Fibromyalgia: takes Tramadol three days per week.    Review of Systems  Constitutional: Negative for fever, chills, diaphoresis, activity change, appetite change, fatigue and unexpected weight change.  HENT: Positive for ear pain and postnasal drip. Negative for congestion, dental problem, drooling, ear discharge, facial swelling, hearing loss, mouth sores, nosebleeds, rhinorrhea, sinus pressure, sneezing, sore throat, tinnitus, trouble swallowing and voice change.   Eyes: Negative for photophobia, pain, discharge, redness, itching and visual disturbance.  Respiratory: Negative for apnea, cough, choking, chest tightness, shortness of breath, wheezing and stridor.   Cardiovascular: Negative for chest pain, palpitations and leg swelling.  Gastrointestinal: Negative for nausea, vomiting, abdominal pain, diarrhea,  constipation, blood in stool, abdominal distention, anal bleeding and rectal pain.  Endocrine: Positive for heat intolerance. Negative for cold intolerance, polydipsia, polyphagia and polyuria.  Genitourinary: Negative for dysuria, urgency, frequency, hematuria, flank pain, decreased urine volume, vaginal bleeding, vaginal discharge, enuresis, difficulty urinating, genital sores, vaginal pain, menstrual problem, pelvic pain and dyspareunia.       Stress incontinence  Musculoskeletal: Positive for back pain and arthralgias. Negative for myalgias, joint swelling, gait problem, neck pain and neck stiffness.  Skin: Negative for color change, pallor, rash and wound.  Allergic/Immunologic: Negative for environmental allergies, food allergies and immunocompromised state.  Neurological: Negative for dizziness, tremors, seizures, syncope, facial asymmetry, speech difficulty, weakness, light-headedness, numbness and headaches.  Hematological: Negative for adenopathy. Does not bruise/bleed easily.  Psychiatric/Behavioral: Negative for suicidal ideas, hallucinations, behavioral problems, confusion, sleep disturbance, self-injury, dysphoric mood, decreased concentration and agitation. The patient is nervous/anxious. The patient is not hyperactive.     Past Medical History  Diagnosis Date  . GERD (gastroesophageal reflux disease)   . HLD (hyperlipidemia)   . MR (mitral regurgitation)     moderate; probably MVP. [facility] 3[facility]10 [facility] with normal EF, moderate MR   . Atypical chest pain     hx   . Arthritis     Knees, feet, elbows , neck  . Fibromyalgia 12/04/2010    [facility] in [place]; Dr. Gwenevere GhaziNicole Keltt; followed every six months.  . Asthma     mild; assoc with seasonal allergies; uses Albuterol once per year on average.  Marland Kitchen. Heart murmur     Mitral valve Prolapse  . Hypothyroidism post radioactive iodine    Hyperthyroidism; now secondary hypothyroidism  Past Surgical History    Procedure Laterality Date  . Knee surgery  7/10  . 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 Inner thigh surgery    . Left foot surgery      for plantar fascitis  . Novascue uterine ablation  2007  . Colonoscopy  12/04/2009    normal; Iftikhar   Allergies  Allergen Reactions  . Celebrex [Celecoxib] Swelling    Developed Facial swelling    Current Outpatient Prescriptions  Medication Sig Dispense Refill  . albuterol (PROAIR HFA) 108 (90 BASE) MCG/ACT inhaler Inhale into the lungs.    Marland Kitchen. albuterol (PROVENTIL HFA;VENTOLIN HFA) 108 (90 BASE) MCG/ACT inhaler Inhale 2 puffs into the lungs every 4 (four) hours as needed for wheezing (cough, shortness of breath or wheezing.). 1 Inhaler 1  . clonazePAM (KLONOPIN) 0.5 MG tablet     . levothyroxine (SYNTHROID, LEVOTHROID) 125 MCG tablet Take 1 tablet (125 mcg total) by mouth daily before breakfast. 90 tablet 3  . omeprazole (PRILOSEC) 20 MG capsule Take 1 capsule (20 mg total) by mouth daily. 90 capsule 3  . traMADol (ULTRAM) 50 MG tablet Take by mouth.     No current facility-administered medications for this visit.       Objective:    BP 142/84 mmHg  Pulse 75  Temp(Src) 98.7 F (37.1 C) (Oral)  Resp 16  Ht 5' 7.5" (1.715 m)  Wt 213 lb 3.2 oz (96.707 kg)  BMI 32.88 kg/m2  SpO2 98% Physical Exam  Constitutional: She is oriented to person, place, and time. She appears well-developed and well-nourished. No distress.  HENT:  Head: Normocephalic and atraumatic.  Right Ear: External ear normal. Tympanic membrane is retracted.  Left Ear: External ear normal.  Nose: Nose normal.  Mouth/Throat: Oropharynx is clear and moist.  Eyes: Conjunctivae and EOM are normal. Pupils are equal, round, and reactive to light.  Neck: Normal range of motion and full passive range of motion without pain. Neck supple. No JVD present. Carotid bruit is not present. No thyromegaly present.  Cardiovascular: Normal rate, regular rhythm and normal heart sounds.   Exam reveals no gallop and no friction rub.   No murmur heard. Pulmonary/Chest: Effort normal and breath sounds normal. She has no wheezes. She has no rales.  Abdominal: Soft. Bowel sounds are normal. She exhibits no distension and no mass. There is no tenderness. There is no rebound and no guarding.  Musculoskeletal:       Right shoulder: Normal.       Left shoulder: Normal.       Cervical back: Normal.       Lumbar back: She exhibits decreased range of motion, tenderness and pain. She exhibits no bony tenderness and no spasm.  Lymphadenopathy:    She has no cervical adenopathy.  Neurological: She is alert and oriented to person, place, and time. She has normal reflexes. No cranial nerve deficit. She exhibits normal muscle tone. Coordination normal.  Skin: Skin is warm and dry. No rash noted. She is not diaphoretic. No erythema. No pallor.  Psychiatric: She has a normal mood and affect. Her behavior is normal. Judgment and thought content normal.  Nursing note and vitals reviewed.  Results for orders placed or performed in visit on 11/09/14  POCT urinalysis dipstick  Result Value Ref Range   Color, UA Yellow    Clarity, UA Clear    Glucose, UA Negative    Bilirubin, UA Negative    Ketones, UA  Negative    Spec Grav, UA <=1.005    Blood, UA trace-lysed    pH, UA 6.5    Protein, UA Negative    Urobilinogen, UA 0.2    Nitrite, UA Negative    Leukocytes, UA Negative    TDAP ADMINISTERED.    Assessment & Plan:   1. Hypothyroidism following radioiodine therapy   2. Routine general medical examination at a health care facility   3. Glucose intolerance (impaired glucose tolerance)   4. Hyperlipidemia   5. Anxiety state      1. Complete Physical Examination: anticipatory guidance --- weight loss, exercise, 3 servings dairy daily. Pap smear UTD; colonoscopy UTD.  Pt to call for mammogram appointment.  S/p tDAP in office.   2.  Hypothyroidism: controlled; obtain labs; refill  provided. 3.  Glucose intolerance: stable; obtain labs; recommend weight loss, exercise, dietary modificatioin. 4.  Hyperlipidemia: stable; continue with dietary modification. 5.  Anxiety: stable; continue Clonazepam.  Continue psychotherapy.   6.  Fibromyalgia:  Controlled; followed by rheumatology.    Meds ordered this encounter  Medications  . levothyroxine (SYNTHROID, LEVOTHROID) 125 MCG tablet    Sig: Take 1 tablet (125 mcg total) by mouth daily before breakfast.    Dispense:  90 tablet    Refill:  3  . omeprazole (PRILOSEC) 20 MG capsule    Sig: Take 1 capsule (20 mg total) by mouth daily.    Dispense:  90 capsule    Refill:  3    Return in about 6 months (around 05/11/2015) for recheck.     Nilda Simmer, M.D.  [facility] [address] [place], [place]  [address] [phone] phone [phone] fax

## 2014-11-09 NOTE — Patient Instructions (Addendum)
1.  CALL [facility] FOR MAMMOGRAM.  Keeping You Healthy  Get These Tests  Blood Pressure- Have your blood pressure checked by your healthcare provider at least once a year.  Normal blood pressure is 120/80.  Weight- Have your body mass index (BMI) calculated to screen for obesity.  BMI is a measure of body fat based on height and weight.  You can calculate your own BMI at https://www.west-esparza.com/www.[email or web address]/bmi/  Cholesterol- Have your cholesterol checked every year.  Diabetes- Have your blood sugar checked every year if you have high blood pressure, high cholesterol, a family history of diabetes or if you are overweight.  Pap Smear- Have a pap smear every 1 to 3 years if you have been sexually active.  If you are older than 65 and recent pap smears have been normal you may not need additional pap smears.  In addition, if you have had a hysterectomy  For benign disease additional pap smears are not necessary.  Mammogram-Yearly mammograms are essential for early detection of breast cancer  Screening for Colon Cancer- Colonoscopy starting at age 54. Screening may begin sooner depending on your family history and other health conditions.  Follow up colonoscopy as directed by your Gastroenterologist.  Screening for Osteoporosis- Screening begins at age 54 with bone density scanning, sooner if you are at higher risk for developing Osteoporosis.  Get these medicines  Calcium with Vitamin D- Your body requires 1200-1500 mg of Calcium a day and [phone] IU of Vitamin D a day.  You can only absorb 500 mg of Calcium at a time therefore Calcium must be taken in 2 or 3 separate doses throughout the day.  Hormones- Hormone therapy has been associated with increased risk for certain cancers and heart disease.  Talk to your healthcare provider about if you need relief from menopausal symptoms.  Aspirin- Ask your healthcare provider about taking Aspirin to prevent Heart Disease and Stroke.  Get  these Immuniztions  Flu shot- Every fall  Pneumonia shot- Once after the age of 54; if you are younger ask your healthcare provider if you need a pneumonia shot.  Tetanus- Every ten years.  Zostavax- Once after the age of 54 to prevent shingles.  Take these steps  Don't smoke- Your healthcare provider can help you quit. For tips on how to quit, ask your healthcare provider or go to www.smokefree.gov or call 1-800 QUIT-NOW.  Be physically active- Exercise 5 days a week for a minimum of 30 minutes.  If you are not already physically active, start slow and gradually work up to 30 minutes of moderate physical activity.  Try walking, dancing, bike riding, swimming, etc.  Eat a healthy diet- Eat a variety of healthy foods such as fruits, vegetables, whole grains, low fat milk, low fat cheeses, yogurt, lean meats, chicken, fish, eggs, dried beans, tofu, etc.  For more information go to www.thenutritionsource.org  Dental visit- Brush and floss teeth twice daily; visit your dentist twice a year.  Eye exam- Visit your Optometrist or Ophthalmologist yearly.  Drink alcohol in moderation- Limit alcohol intake to one drink or less a day.  Never drink and drive.  Depression- Your emotional health is as important as your physical health.  If you're feeling down or losing interest in things you normally enjoy, please talk to your healthcare provider.  Seat Belts- can save your life; always wear one  Smoke/Carbon Monoxide detectors- These detectors need to be installed on the appropriate level of your home.  Replace  batteries at least once a year.  Violence- If anyone is threatening or hurting you, please tell your healthcare provider.  Living Will/ Health care power of attorney- Discuss with your healthcare provider and family.

## 2014-11-15 ENCOUNTER — Telehealth: Payer: Self-pay | Admitting: *Deleted

## 2014-11-15 NOTE — Telephone Encounter (Signed)
Pt called to inquire about lab results. Looks like they have yet to be reviewed. Please advise.

## 2014-11-16 NOTE — Telephone Encounter (Signed)
Labs sent to Lab Pool to contact pt with results.

## 2014-12-15 ENCOUNTER — Ambulatory Visit: Payer: Self-pay | Admitting: Family Medicine

## 2015-01-25 ENCOUNTER — Ambulatory Visit (INDEPENDENT_AMBULATORY_CARE_PROVIDER_SITE_OTHER): Payer: Federal, State, Local not specified - PPO

## 2015-01-25 ENCOUNTER — Ambulatory Visit (INDEPENDENT_AMBULATORY_CARE_PROVIDER_SITE_OTHER): Payer: Federal, State, Local not specified - PPO | Admitting: Podiatry

## 2015-01-25 ENCOUNTER — Encounter: Payer: Self-pay | Admitting: Podiatry

## 2015-01-25 VITALS — BP 121/74 | HR 77 | Resp 12

## 2015-01-25 DIAGNOSIS — M722 Plantar fascial fibromatosis: Secondary | ICD-10-CM

## 2015-01-25 DIAGNOSIS — R52 Pain, unspecified: Secondary | ICD-10-CM

## 2015-01-25 MED ORDER — METHYLPREDNISOLONE (PAK) 4 MG PO TABS: ORAL_TABLET | ORAL | Status: DC

## 2015-01-25 NOTE — Progress Notes (Signed)
   Subjective:    Patient ID: Jane [name], female    DOB: 1961/05/03, 54 y.o.   MRN: [id number]  HPI  Pt stated lt back of the heel and medial side of the arch been painful for 6 months. The foot is getting worse especially when walking/ standing. Tried pain cream and it help some.  Review of Systems  All other systems reviewed and are negative.      Objective:   Physical Exam: I have reviewed her past medical history medications allergies surgery social history and review of systems area pulses are strongly palpable bilateral. Neurologic sensorium is intact percent was the monofilament. Deep tendon reflexes are intact bilateral and muscle strength is 5 over 5 dorsiflexion plantar flexors and inverters everters all intrinsic musculature is intact. Orthopedic evaluation Mr. is all joints distal to the ankle for range of motion without crepitation. She has pain on palpation of the lateral calcaneal tubercle the soft tissue increase in density on radiographic plantar fascial calcaneal insertion site. This is indicative of lateral band plantar fasciitis.        Assessment & Plan:  Assessment: Lateral band plantar fasciitis left heel.  Plan: We discussed the etiology pathology conservative versus surgical therapies. Injected the left lateral band of the plantar fascia at its calcaneal insertion site today. Started her on a Medrol Dosepak to be followed by meloxicam. She was dispensed a plantar fascial brace and a night splint. We discussed appropriate shoe gear stretching exercises and ice therapy I will follow-up with her in 1 month

## 2015-01-27 ENCOUNTER — Encounter: Payer: Self-pay | Admitting: Family Medicine

## 2015-02-01 ENCOUNTER — Telehealth: Payer: Self-pay | Admitting: *Deleted

## 2015-02-01 NOTE — Telephone Encounter (Signed)
Patient called and stated that her foot was hurting along side of the foot, but where he gave the injection was great, she wanted to know if Dr Al CorpusHyatt would overlook her xray and check to make sure no fracture of the 5th metatarsal , spoke with dr Al Corpushyatt he looked at xray again and did not see any fracture that it was just the fasciitis possibly her walking on the side of her foot. If she is still having problems when she comes in we can re xray the foot and possibly send her for a mri . Tyrianna was ok with this

## 2015-02-22 ENCOUNTER — Encounter: Payer: Self-pay | Admitting: Podiatry

## 2015-02-22 ENCOUNTER — Ambulatory Visit (INDEPENDENT_AMBULATORY_CARE_PROVIDER_SITE_OTHER): Payer: Federal, State, Local not specified - PPO | Admitting: Podiatry

## 2015-02-22 VITALS — BP 149/84 | HR 54 | Resp 16

## 2015-02-22 DIAGNOSIS — M722 Plantar fascial fibromatosis: Secondary | ICD-10-CM | POA: Diagnosis not present

## 2015-02-22 NOTE — Progress Notes (Signed)
She presents today for follow-up of her plantar fasciitis. She is concerned about the pain to the fifth metatarsal of the left foot which has resolved since she's been off work for the past few days. She states that when it was broken before hurt all the time I don't gets its broke she says because it doesn't hurt all the time now.  Objective: Vital signs are stable she is alert and oriented 3. She has a very prominent fifth metatarsal base left greater than right as well as a plantar flexed fifth metatarsal bilateral. She has pain on palpation fifth metatarsal base there is no edema no erythema cellulitis drainage or odor however the fat pad feels very thin.  Assessment: Metatarsalgia fifth metatarsal bilateral plantar fasciitis bilateral.  Plan: She was scan today for an accommodative pair orthotics and I will follow-up with her in 1 month

## 2015-03-09 ENCOUNTER — Telehealth: Payer: Self-pay

## 2015-03-09 ENCOUNTER — Ambulatory Visit (INDEPENDENT_AMBULATORY_CARE_PROVIDER_SITE_OTHER): Payer: Federal, State, Local not specified - PPO | Admitting: Family Medicine

## 2015-03-09 VITALS — BP 178/94 | HR 76 | Temp 98.8°F | Resp 20 | Ht 67.5 in | Wt 191.1 lb

## 2015-03-09 DIAGNOSIS — R55 Syncope and collapse: Secondary | ICD-10-CM

## 2015-03-09 DIAGNOSIS — IMO0002 Reserved for concepts with insufficient information to code with codable children: Secondary | ICD-10-CM

## 2015-03-09 LAB — POCT CBC
Granulocyte percent: 63.5 %G (ref 37–80)
HCT, POC: 43.5 % (ref 37.7–47.9)
Hemoglobin: 13.6 g/dL (ref 12.2–16.2)
Lymph, poc: 2.3 (ref 0.6–3.4)
MCH, POC: 27 pg (ref 27–31.2)
MCHC: 31.4 g/dL — AB (ref 31.8–35.4)
MCV: 86.2 fL (ref 80–97)
MID (cbc): 0.4 (ref 0–0.9)
MPV: 7.9 fL (ref 0–99.8)
POC Granulocyte: 4.7 (ref 2–6.9)
POC LYMPH PERCENT: 31.6 %L (ref 10–50)
POC MID %: 4.9 %M (ref 0–12)
Platelet Count, POC: 224 10*3/uL (ref 142–424)
RBC: 5.05 M/uL (ref 4.04–5.48)
RDW, POC: 15.5 %
WBC: 7.4 10*3/uL (ref 4.6–10.2)

## 2015-03-09 LAB — GLUCOSE, POCT (MANUAL RESULT ENTRY): POC Glucose: 86 mg/dl (ref 70–99)

## 2015-03-09 NOTE — Telephone Encounter (Signed)
Patient fell this morning at 3:00  Broke the lamp, slid down the wall and is very tired, going home.   Please call after 10:30.  Stated she did not feel dizzy or anything when she fell.  [phone]

## 2015-03-09 NOTE — Progress Notes (Signed)
Patient ID: Jane [name], female   DOB: 1961/01/26, 54 y.o.   MRN: [id number]   This chart was scribed for Jane Sidle, MD by [facility], medical scribe at [facility].The patient was seen in exam room 07 and the patient's care was started at 7:07 PM.  Patient ID: Jane [name] MRN: [id number], DOB: 04-12-1961, 54 y.o. Date of Encounter: 03/09/2015  Primary Physician: Nilda Simmer, MD  Chief Complaint:  Chief Complaint  Patient presents with   Blacked out    3:30 am when got up to go to bathroom   HPI:  Jane [name] is a 54 y.o. female who presents to [facility] complaining of syncope episode, acute onset early this morning around 3:20 AM. She went to go to the bathroom around 3:20 AM, while walking she held on to the post and she ran straight into the dresser and hit a lamp and bounced backwards and hit the door knob of the bathroom door bruising her back. Pt does complain of SOB, fatigue and increased hunger today. 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 Pt checked her blood pressure and pulse at home it was 117/64 and pulse around 65. Recheck in office while sitting was 134/90, pulse was around 60 and while standing 138/90. She is not currently on any medication and has been losing weight. No recent illness. She denies pain with deep breathing, increased thirst and nausea. She carries mail for a living.   Past Medical History  Diagnosis Date   GERD (gastroesophageal reflux disease)    HLD (hyperlipidemia)    MR (mitral regurgitation)     moderate; probably MVP. echo 3/10 at [facility]ology with normal EF, moderate MR    Atypical chest pain     hx    Arthritis     Knees, feet, elbows , neck   Fibromyalgia 12/04/2010    [facility] in [place]; Dr. Gwenevere Ghazi; followed every six months.   Asthma     mild; assoc with seasonal allergies; uses Albuterol once per  year on average.   Heart murmur     Mitral valve Prolapse   Hypothyroidism post radioactive iodine    Hyperthyroidism; now secondary hypothyroidism    Home Meds: Prior to Admission medications   Medication Sig Start Date End Date Taking? Authorizing Provider  levothyroxine (SYNTHROID, LEVOTHROID) 125 MCG tablet Take 1 tablet (125 mcg total) by mouth daily before breakfast. 11/09/14  Yes Ethelda Chick, MD  omeprazole (PRILOSEC) 20 MG capsule Take 1 capsule (20 mg total) by mouth daily. 11/09/14  Yes Ethelda Chick, MD  traMADol (ULTRAM) 50 MG tablet Take by mouth.   Yes Historical Provider, MD  Vitamin D, Ergocalciferol, (DRISDOL) 50000 UNITS CAPS capsule  11/19/14  Yes Historical Provider, MD   Allergies:  Allergies  Allergen Reactions   Celebrex [Celecoxib] Swelling and Shortness Of Breath    Swelling. Developed Facial swelling    History   Social History   Marital Status: Married    Spouse Name: N/A   Number of Children: N/A   Years of Education: N/A   Occupational History   Not on file.   Social History Main Topics   Smoking status: Former Smoker    Types: Cigarettes   Smokeless tobacco: Never Used     Comment: smoked 2ppd for 15 years; quit in 1995    Alcohol Use: No  Comment: social, rare   Drug Use: No   Sexual Activity: Yes    Birth Control/ Protection: Post-menopausal   Other Topics Concern   Not on file   Social History Narrative   Marital status:  Married x 20 years; happily married no abuse, no children.      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       Exercise:  Does not get regular exercise.       Seatbelt: 100%      Guns:  Unloaded.  Has concealed carry.    Review of Systems: Constitutional: negative for chills, fever, night sweats, weight changes. Positive for  fatigue  HEENT: negative for vision changes, hearing loss, congestion, rhinorrhea, ST, epistaxis, or sinus pressure Cardiovascular: negative for chest pain or palpitations Respiratory: negative for hemoptysis, wheezing, or cough. Positive for shortness of breath. Abdominal: negative for abdominal pain, nausea, vomiting, diarrhea, or constipation Dermatological: negative for rash Neurologic: negative for headache, dizziness. Positive for syncope All other systems reviewed and are otherwise negative with the exception to those above and in the HPI.  Physical Exam: Blood pressure 178/94, pulse 76, temperature 98.8 F (37.1 C), temperature source Oral, resp. rate 20, height 5' 7.5" (1.715 m), weight 191 lb 2 oz (86.694 kg), SpO2 98 %., Body mass index is 29.48 kg/(m^2). General: Well developed, well nourished, in no acute distress. Head: Normocephalic, atraumatic, eyes without discharge, sclera non-icteric, nares are without discharge. Bilateral auditory canals clear, TM's are without perforation, pearly grey and translucent with reflective cone of light bilaterally. Oral cavity moist, posterior pharynx without exudate, erythema, peritonsillar abscess, or post nasal drip.  Neck: Supple. No thyromegaly. Full ROM. No lymphadenopathy. Lungs: Clear bilaterally to auscultation without wheezes, rales, or rhonchi. Breathing is unlabored. Heart: RRR with S1 S2. No murmurs, rubs, or gallops appreciated. Abdomen: Soft, non-tender, non-distended with normoactive bowel sounds. No hepatomegaly. No rebound/guarding. No obvious abdominal masses. Msk:  Strength and tone normal for age. A linear bruise and abrasion in the posterior left axially with tenderness underlying. A 1 cm partial thickness laceration on the PIP joint of the left index finger. Extremities/Skin: Warm and dry. No clubbing or cyanosis. No edema. No rashes or suspicious lesions. Neuro: Alert and oriented X 3. Moves all extremities spontaneously.  Gait is normal. CNII-XII grossly in tact. Psych:  Responds to questions appropriately with a normal affect.   Labs: Results for orders placed or performed in visit on 03/09/15  POCT CBC  Result Value Ref Range   WBC 7.4 4.6 - 10.2 K/uL   Lymph, poc 2.3 0.6 - 3.4   POC LYMPH PERCENT 31.6 10 - 50 %L   MID (cbc) 0.4 0 - 0.9   POC MID % 4.9 0 - 12 %M   POC Granulocyte 4.7 2 - 6.9   Granulocyte percent 63.5 37 - 80 %G   RBC 5.05 4.04 - 5.48 M/uL   Hemoglobin 13.6 12.2 - 16.2 g/dL   HCT, POC 45.443.5 09.837.7 - 47.9 %   MCV 86.2 80 - 97 fL   MCH, POC 27.0 27 - 31.2 pg   MCHC 31.4 (A) 31.8 - 35.4 g/dL   RDW, POC 11.915.5 %  Platelet Count, POC 224 142 - 424 K/uL   MPV 7.9 0 - 99.8 fL  POCT glucose (manual entry)  Result Value Ref Range   POC Glucose 86 70 - 99 mg/dl    EKG:  NSR  ASSESSMENT AND PLAN:  54 y.o. year old female with  This chart was scribed in my presence and reviewed by me personally.    ICD-9-CM ICD-10-CM   1. Passed out 780.2 R55 POCT CBC     POCT glucose (manual entry)     EKG 12-Lead  2. Syncope, unspecified syncope type 780.2 R55      Signed, Jane Sidle, MD    Signed, Jane Sidle, MD 03/09/2015 7:07 PM

## 2015-03-09 NOTE — Telephone Encounter (Signed)
Spoke with pt--she thinks she passed out. Pt says she feels out of breath when I asked her. Told her she needed to come in and be seen. Pt is going to come in tonight.

## 2015-03-09 NOTE — Patient Instructions (Signed)
Syncope °Syncope means a person passes out (faints). The person usually wakes up in less than 5 minutes. It is important to seek medical care for syncope. °HOME CARE °· Have someone stay with you until you feel normal. °· Do not drive, use machines, or play sports until your doctor says it is okay. °· Keep all doctor visits as told. °· Lie down when you feel like you might pass out. Take deep breaths. Wait until you feel normal before standing up. °· Drink enough fluids to keep your pee (urine) clear or pale yellow. °· If you take blood pressure or heart medicine, get up slowly. Take several minutes to sit and then stand. °GET HELP RIGHT AWAY IF:  °· You have a severe headache. °· You have pain in the chest, belly (abdomen), or back. °· You are bleeding from the mouth or butt (rectum). °· You have black or tarry poop (stool). °· You have an irregular or very fast heartbeat. °· You have pain with breathing. °· You keep passing out, or you have shaking (seizures) when you pass out. °· You pass out when sitting or lying down. °· You feel confused. °· You have trouble walking. °· You have severe weakness. °· You have vision problems. °If you fainted, call your local emergency services (911 in [place] Do not drive yourself to the hospital. °MAKE SURE YOU:  °· Understand these instructions. °· Will watch your condition. °· Will get help right away if you are not doing well or get worse. °Document Released: 05/08/2008 Document Revised: 05/21/2012 Document Reviewed: 01/19/2012 °[employer]® Patient Information ©2015 [employer]. This information is not intended to replace advice given to you by your health care provider. Make sure you discuss any questions you have with your health care provider. ° °

## 2015-03-15 ENCOUNTER — Ambulatory Visit (INDEPENDENT_AMBULATORY_CARE_PROVIDER_SITE_OTHER): Payer: Federal, State, Local not specified - PPO | Admitting: *Deleted

## 2015-03-15 DIAGNOSIS — M722 Plantar fascial fibromatosis: Secondary | ICD-10-CM

## 2015-03-15 NOTE — Progress Notes (Signed)
Orthotics picked up. Given instructions for slow breakin. 1 mo. For checkup, if needed.

## 2015-03-15 NOTE — Patient Instructions (Signed)

## 2015-03-22 ENCOUNTER — Encounter: Payer: Self-pay | Admitting: Podiatry

## 2015-03-22 ENCOUNTER — Ambulatory Visit (INDEPENDENT_AMBULATORY_CARE_PROVIDER_SITE_OTHER): Payer: Federal, State, Local not specified - PPO | Admitting: Podiatry

## 2015-03-22 VITALS — BP 135/69 | HR 55 | Resp 16

## 2015-03-22 DIAGNOSIS — M7740 Metatarsalgia, unspecified foot: Secondary | ICD-10-CM

## 2015-03-22 NOTE — Progress Notes (Signed)
She presents today for follow-up of her orthotics. She picked them up a couple weeks ago and is stating that she is having a lot of pain on the fifth metatarsal base bilaterally left greater than right. She states that she has stopped wearing the orthotics at this point.  Objective: Vital signs are stable she is alert and oriented 3. No erythema edema saline as drainage or odor no superficial areas of irritation or ulceration base of the fifth met bilateral. Orthotics are nice and flexible cushion.  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  Plan: Continue the use of these orthotics follow up with me in 2 months if necessary

## 2015-03-26 NOTE — Discharge Summary (Signed)
PATIENT NAME:  Jane [name], Jane [name] MR#:  [id number] DATE OF BIRTH:  [birth year]  DATE OF ADMISSION:  12/25/2012 DATE OF DISCHARGE:  12/28/2012  ADMITTING DIAGNOSIS: Degenerative arthrosis of the left knee.   DISCHARGE DIAGNOSIS: Degenerative arthrosis of the left knee.   HISTORY: The patient is a pleasant 54 year old white female who has [facility] for progression of left knee pain. She reported left knee pain especially along the medial aspect of the knee. Her pain was aggravated with prolonged weight-bearing activities. She has also appreciated some swelling of the knee as well as some decrease in her range of motion. She began having increasing difficulty with continuing her work duties at the [employer] due to her knee symptoms. She had not seen any significant lasting improvement with the left knee symptoms despite orthotics, nonsteroidal antiinflammatory medications, corticosteroid injections as well as Synvisc injections. She also tried osteoarthritic knee brace which did not give her any relief over the last 4 to 5 months. At the time of surgery, she was not using any ambulatory aid. The pain had progressed to the point that it was significantly limiting her activities of daily living. The patient had previously underwent a left knee arthroscopy, on 04/13/2011. [facility] taken [facility] showed narrowing of the medial cartilage space with associated varus alignment. Osteophyte as well as subchondral sclerosis was noted. After discussion of the risks and benefits of surgical intervention, the patient expressed her understanding of the risks and benefits and agreed with plans for surgical intervention.   PROCEDURE: Left total knee arthroplasty using computer-assisted navigation.   ANESTHESIA: Femoral nerve block with spinal.   SOFT TISSUE RELEASE: Anterior cruciate ligament, posterior cruciate ligament, deep medial collateral ligaments, as well as  patellofemoral ligament.   IMPLANTS UTILIZED: DePuy PFC Sigma size 3 posterior stabilized femoral component (cemented), size 3 MBT tibial component (cemented), 35 mm three pegged oval dome patella (cemented), and a 10 mm stabilized rotating platform polyethylene insert.   HOSPITAL COURSE: The patient tolerated the procedure very well. She had no complications. She was then taken to PAC-U where she was stabilized and then transferred to the orthopedic floor. She began receiving anticoagulation therapy of Lovenox 30 mg subcutaneous every 12 hours per anesthesia and pharmacy protocol. She was fitted with TED stockings bilaterally. These were allowed to be removed 1 hour per 8 hour shift. The left one was applied on the day following removal of the Hemovac and dressing change. The patient was also fitted with the AV-I compression foot pumps bilaterally set at 80 mmHg. Her calves have been nontender. There has been no evidence of any DVTs. Heels were elevated off the bed using rolled towels.   The patient's vital signs have been stable. She has been afebrile. Hemodynamically she was stable and no transfusions were given other than the Autovac transfusion given the first 6 hours postoperatively. Occupational therapy was also initiated on day 1 for ADL and assistive devices.   PT was initiated on day 1 for gait training and transfers. She has done very well. Upon being discharged was ambulating greater than 200 feet. She was independent with bed to chair transfers. She was able to ambulate 4 steps. Occupational therapy was also initiated on day 1 for ADL and assistive devices.   The patient's IV, Foley and Hemovac were discontinued on day 2 along with a dressing change. The Polar Care was reapplied to the surgical leg maintaining a temperature of 40 to 50 degrees Fahrenheit.  The wound was free of any drainage or signs of infection.   DISPOSITION: The patient is being discharged to home in improved stable  condition.   DISCHARGE INSTRUCTIONS: 1. She may weight bear as tolerated.  2. She is to elevate the surgical leg on 1 to 2 pillows with the foot higher than the knee.  3. She is to continue with the walker until cleared by physical therapy to go to a quad cane.  4. She will receive home health physical therapy.  5. She is to continue with the TED stockings. These need to be worn during the day but may be removed at night.  6. Continue to use the Polar Care maintaining a temperature of 40 to 50 degrees Fahrenheit.  7. She is placed on a regular diet.  8. She has a followup appointment in [facility] in 2 weeks. She is to call the Clinic sooner if any temperatures of 101.5 or greater or excessive bleeding.  9. Resume her regular medication that she was on prior to admission. She was given a prescription for Roxicodone 5 to 10 mg q. 4 to 6 hours p.r.n. for pain, Ultram 50 to 100 mg q. 4 to 6 hours p.r.n. for pain and Lovenox 40 mg sub-Q q. day for 14 days and then discontinue and begin taking one 81 mg enteric-coated aspirin.   PAST MEDICAL HISTORY: 1. Chickenpox. 2. Seasonal allergies. 3. Seasonal asthma.  4. Gastroesophageal reflux disease. 5. Iatrogenic hypothyroidism status post radioactive iodine ablation.  6. Mitral valve prolapse.  7. Hypercholesterolemia.  ____________________________ Van Clines, PA [id number] D: 12/27/2012 07:42:00 ET T: 12/27/2012 08:52:43 ET JOB#: [id number]  cc: Van Clines, PA, <Dictator> JON WOLFE PA ELECTRONICALLY SIGNED 12/29/2012 18:49

## 2015-03-26 NOTE — Op Note (Signed)
PATIENT NAME:  Jane [name], [name] MR#:  [id number] DATE OF BIRTH:  1961-01-11  DATE OF PROCEDURE:  12/25/2012  PREOPERATIVE DIAGNOSIS: Degenerative arthrosis of the left knee.   POSTOPERATIVE DIAGNOSIS: Degenerative arthrosis of the left knee.   PROCEDURE PERFORMED: Left total knee arthroplasty using computer-assisted navigation.   SURGEON: Francesco SorJames Hooten, M.D.   ASSISTANT: Van ClinesJon Wolfe, PA-C (required to maintain retraction throughout the procedure).   ANESTHESIA: Femoral nerve block and spinal.   ESTIMATED BLOOD LOSS: 50 mL.   FLUIDS REPLACED: 2300 mL of crystalloid.   TOURNIQUET TIME: 84 minutes.   DRAINS: Two medium drains to reinfusion system.   SOFT TISSUE RELEASES: Anterior cruciate ligament, posterior cruciate ligament, deep medial collateral ligament, and patellofemoral ligament.   IMPLANTS UTILIZED: DePuy PFC Sigma size 3 posterior stabilized femoral component (cemented), size 3 MBT tibial component (cemented), 35 mm 3-peg oval dome patella (cemented), and a 10 mm stabilized rotating platform polyethylene insert.   INDICATIONS FOR SURGERY: The patient is a 54 year old female, who has been seen for complaints of progressive left knee pain. X-rays demonstrated significant degenerative changes in tricompartmental fashion. The patient denies any significant improvement despite conservative nonsurgical intervention. After discussion of the risks and benefits of surgical intervention, the patient expressed her understanding of the risks and benefits and agreed with plans for surgical intervention.   PROCEDURE IN DETAIL: The patient was brought in the operating room, and after adequate femoral nerve block and spinal anesthesia was achieved, a tourniquet was placed on the patient's upper left thigh. The patient's left knee and leg were cleaned and prepped with alcohol and DuraPrep and draped in the usual sterile fashion. A "timeout" was performed as per usual protocol. The left lower extremity  was exsanguinated using an Esmarch, and the tourniquet was inflated to 300 mmHg. Anterior longitudinal incision was made followed by a standard mid vastus approach. A large effusion was evacuated. The deep fibers of the medial collateral ligament were elevated in a subperiosteal fashion off the medial flare of the tibia so as to maintain a continuous soft tissue sleeve. The patella was subluxed laterally and the patellofemoral ligament was incised. Inspection of the knee demonstrated severe degenerative changes in tricompartmental fashion with full-thickness loss of articular cartilage noted. Prominent osteophytes were debrided using a rongeur. Anterior and posterior cruciate ligaments were excised. Two 4.0 mm Schanz pins were inserted into the femur and into the tibia for attachment of the array of trackers used for computer-assisted navigation. Hip center was identified using circumduction technique. Distal landmarks were mapped using the computer. The distal femur and proximal tibia were mapped using the computer. Distal femoral cutting guide was positioned using computer-assisted navigation so as to achieve a 5 degree distal valgus cut. Cut was performed and verified using the computer. Distal femur was sized and it was felt that a size 3 femoral component was appropriate. A size 5 cutting guide was positioned and anterior cut was performed and verified using the computer. This was followed by completion of the posterior and chamfer cuts. Femoral cutting guide for central box was then positioned and the central box cut was performed.   Attention was then directed to the proximal tibia. Medial and lateral menisci were excised. The extramedullary tibial cutting guide was positioned using computer-assisted navigation so as to achieve a 0 degree varus valgus alignment and a 0 degree posterior slope. Cut was performed and verified using the computer. The proximal tibia was sized and it was felt that a size 3 tibial  tray was appropriate. Tibial and femoral trials were inserted followed by insertion of a 10 mm polyethylene insert. Excellent mediolateral soft tissue balancing was appreciated both in extension and in flexion. Finally, the patella was cut and prepared so as to accommodate a 35 mm 3-peg oval dome patella. Patellar trial was placed and the knee was placed through a range of motion with excellent patellar tracking appreciated. The femoral trial was removed. Central post hole for the tibial component was reamed followed by insertion of a keel punch. Tibial trials were then removed. The cut surfaces of bone were irrigated with copious amounts of normal saline with antibiotic solution using pulsatile lavage and then suctioned dry. Polymethyl methacrylate cement was prepared in the usual fashion using a vacuum mixer. Cement was applied to the cut surface of the proximal tibia as well as along the undersurface of a size 3 MBT tibial component. The tibial component was positioned and impacted into place. Excess cement was removed using Personal assistant. Cement was then applied to the cut surface of the femur as well as along the posterior flanges of a size 3 posterior stabilized femoral component. Femoral component was positioned and impacted into place. Excess cement was removed using Personal assistant. A 10 mm polyethylene trial was inserted and the knee was brought in full extension with steady axial compression applied. Finally, cement was applied to the backside of a 35 mm 3-peg oval dome patella and the patellar component was positioned and patellar clamp applied. Excess cement was removed using Personal assistant.   After adequate curing of cement, the tourniquet was deflated after total tourniquet time of 84 minutes. Hemostasis was achieved using electrocautery. The knee was irrigated with copious amounts of normal saline with antibiotic solution using pulsatile lavage and then suctioned dry. The knee was inspected for  any residual cement debris. Marcaine 30 mL of 0.25% with epinephrine was injected along the posterior capsule. A 10 mm stabilized rotating platform polyethylene insert was inserted and the knee was placed through a range of motion with excellent mediolateral soft tissue balance appreciated and excellent patellar tracking appreciated. Two medium drains were placed in the wound bed and brought out through a separate stab incision to be attached to a reinfusion system. The medial parapatellar portion of the incision was reapproximated using interrupted sutures of #1 Vicryl. The subcutaneous tissue was approximated in layers using first #0 Vicryl followed by #2-0 Vicryl. Skin was closed with skin staples. A sterile dressing was applied. The patient tolerated the procedure well. She was transported to the recovery room in stable condition.    ____________________________ Illene Labrador. Angie Fava., MD jph:aw D: 12/26/2012 00:57:26 ET T: 12/26/2012 07:10:35 ET JOB#: [id number]  cc: Fayrene Fearing P. Angie Fava., MD, <Dictator> JAMES P Angie Fava MD ELECTRONICALLY SIGNED 12/31/2012 23:38

## 2015-04-14 ENCOUNTER — Ambulatory Visit: Payer: Federal, State, Local not specified - PPO | Admitting: Podiatry

## 2015-05-17 ENCOUNTER — Ambulatory Visit: Payer: Federal, State, Local not specified - PPO | Admitting: Family Medicine

## 2015-05-24 ENCOUNTER — Encounter: Payer: Self-pay | Admitting: Podiatry

## 2015-05-24 ENCOUNTER — Ambulatory Visit (INDEPENDENT_AMBULATORY_CARE_PROVIDER_SITE_OTHER): Payer: Federal, State, Local not specified - PPO | Admitting: Podiatry

## 2015-05-24 VITALS — BP 133/76 | HR 76 | Resp 17

## 2015-05-24 DIAGNOSIS — M7752 Other enthesopathy of left foot: Secondary | ICD-10-CM

## 2015-05-24 DIAGNOSIS — M722 Plantar fascial fibromatosis: Secondary | ICD-10-CM

## 2015-05-24 DIAGNOSIS — M778 Other enthesopathies, not elsewhere classified: Secondary | ICD-10-CM

## 2015-05-24 DIAGNOSIS — M779 Enthesopathy, unspecified: Secondary | ICD-10-CM

## 2015-05-24 NOTE — Progress Notes (Signed)
She presents today for follow-up of pain to the medial and plantar medial aspect of the first metatarsal medial cuneiform joint. He states that she is wearing her orthotics but feels that she is rolling to the outside of the shoe. She states that she may need to get a new pair of shoes.  Objective: Vital signs are stable she is alert and oriented 3. He still has pain on palpation plantar aspect of the first metatarsal medial cuneiform joint area and there appears to be fluctuance within the posterior tibial tendon sheath and anterior tibial tendon sheath. This area is tender on palpation.  Assessment: Capsulitis medial aspect of the left foot associated with tibial tendinitis left  Plan: I suggested an MRI to the left foot however she declined at this time. She like to continue the use of the orthotics for a while.

## 2015-05-28 ENCOUNTER — Ambulatory Visit: Payer: Federal, State, Local not specified - PPO | Admitting: Family Medicine

## 2015-07-27 ENCOUNTER — Telehealth: Payer: Self-pay

## 2015-07-27 DIAGNOSIS — E034 Atrophy of thyroid (acquired): Secondary | ICD-10-CM

## 2015-07-27 NOTE — Telephone Encounter (Signed)
Pt would like to know if Dr Katrinka Blazing would put in an order for her to have her thyroid checked. Please call [phone]

## 2015-07-28 NOTE — Telephone Encounter (Signed)
Call --- I placed order for thyroid levels.  Please advise pt.  Why does pt need labs checked?  Is she having specific symptoms?

## 2015-07-31 NOTE — Telephone Encounter (Signed)
Called pt to call back and let me know her symptoms and that lab order was placed. Please get info when pt calls back.

## 2015-11-20 ENCOUNTER — Other Ambulatory Visit: Payer: Self-pay | Admitting: Family Medicine

## 2015-12-16 ENCOUNTER — Telehealth: Payer: Self-pay

## 2015-12-16 MED ORDER — LEVOTHYROXINE SODIUM 112 MCG PO TABS: ORAL_TABLET | ORAL | Status: DC

## 2015-12-16 NOTE — Telephone Encounter (Signed)
Pt is almost out of the levothyroxine (SYNTHROID, LEVOTHROID) 112 MCG tablet and needs a refill. Pt is planning to come in to see Dr. Katrinka BlazingSmith at 102 next Wednesday between 4pm-8:30pm.

## 2015-12-16 NOTE — Telephone Encounter (Signed)
Rx sent in. This is the last one, she must see Dr. Katrinka BlazingSmith.

## 2016-01-01 ENCOUNTER — Ambulatory Visit (INDEPENDENT_AMBULATORY_CARE_PROVIDER_SITE_OTHER): Payer: Federal, State, Local not specified - PPO | Admitting: Family Medicine

## 2016-01-01 VITALS — BP 140/80 | HR 68 | Temp 98.4°F | Resp 17 | Ht 67.32 in | Wt 201.0 lb

## 2016-01-01 DIAGNOSIS — M797 Fibromyalgia: Secondary | ICD-10-CM

## 2016-01-01 DIAGNOSIS — Z01419 Encounter for gynecological examination (general) (routine) without abnormal findings: Secondary | ICD-10-CM

## 2016-01-01 DIAGNOSIS — Z1159 Encounter for screening for other viral diseases: Secondary | ICD-10-CM

## 2016-01-01 DIAGNOSIS — R7302 Impaired glucose tolerance (oral): Secondary | ICD-10-CM | POA: Diagnosis not present

## 2016-01-01 DIAGNOSIS — E038 Other specified hypothyroidism: Secondary | ICD-10-CM | POA: Diagnosis not present

## 2016-01-01 DIAGNOSIS — E6609 Other obesity due to excess calories: Secondary | ICD-10-CM | POA: Insufficient documentation

## 2016-01-01 DIAGNOSIS — Z0000 Encounter for general adult medical examination without abnormal findings: Secondary | ICD-10-CM | POA: Diagnosis not present

## 2016-01-01 DIAGNOSIS — Z114 Encounter for screening for human immunodeficiency virus [HIV]: Secondary | ICD-10-CM | POA: Diagnosis not present

## 2016-01-01 DIAGNOSIS — Z124 Encounter for screening for malignant neoplasm of cervix: Secondary | ICD-10-CM | POA: Diagnosis not present

## 2016-01-01 DIAGNOSIS — E785 Hyperlipidemia, unspecified: Secondary | ICD-10-CM | POA: Diagnosis not present

## 2016-01-01 DIAGNOSIS — K219 Gastro-esophageal reflux disease without esophagitis: Secondary | ICD-10-CM | POA: Insufficient documentation

## 2016-01-01 DIAGNOSIS — E034 Atrophy of thyroid (acquired): Secondary | ICD-10-CM | POA: Diagnosis not present

## 2016-01-01 DIAGNOSIS — E669 Obesity, unspecified: Secondary | ICD-10-CM

## 2016-01-01 DIAGNOSIS — Z6833 Body mass index (BMI) 33.0-33.9, adult: Secondary | ICD-10-CM

## 2016-01-01 DIAGNOSIS — J4520 Mild intermittent asthma, uncomplicated: Secondary | ICD-10-CM | POA: Insufficient documentation

## 2016-01-01 LAB — COMPREHENSIVE METABOLIC PANEL
ALBUMIN: 4.3 g/dL (ref 3.6–5.1)
ALK PHOS: 78 U/L (ref 33–130)
ALT: 18 U/L (ref 6–29)
AST: 18 U/L (ref 10–35)
BUN: 17 mg/dL (ref 7–25)
CO2: 30 mmol/L (ref 20–31)
CREATININE: 0.71 mg/dL (ref 0.50–1.05)
Calcium: 9.8 mg/dL (ref 8.6–10.4)
Chloride: 102 mmol/L (ref 98–110)
Glucose, Bld: 92 mg/dL (ref 65–99)
POTASSIUM: 4 mmol/L (ref 3.5–5.3)
Sodium: 140 mmol/L (ref 135–146)
TOTAL PROTEIN: 7.4 g/dL (ref 6.1–8.1)
Total Bilirubin: 0.4 mg/dL (ref 0.2–1.2)

## 2016-01-01 LAB — CBC WITH DIFFERENTIAL/PLATELET
Basophils Absolute: 0 10*3/uL (ref 0.0–0.1)
Basophils Relative: 0 % (ref 0–1)
Eosinophils Absolute: 0.2 10*3/uL (ref 0.0–0.7)
Eosinophils Relative: 3 % (ref 0–5)
HCT: 43.1 % (ref 36.0–46.0)
Hemoglobin: 14.4 g/dL (ref 12.0–15.0)
LYMPHS ABS: 1.2 10*3/uL (ref 0.7–4.0)
Lymphocytes Relative: 21 % (ref 12–46)
MCH: 29.1 pg (ref 26.0–34.0)
MCHC: 33.4 g/dL (ref 30.0–36.0)
MCV: 87.1 fL (ref 78.0–100.0)
MPV: 10.1 fL (ref 8.6–12.4)
Monocytes Absolute: 0.4 10*3/uL (ref 0.1–1.0)
Monocytes Relative: 7 % (ref 3–12)
Neutro Abs: 3.9 10*3/uL (ref 1.7–7.7)
Neutrophils Relative %: 69 % (ref 43–77)
Platelets: 261 10*3/uL (ref 150–400)
RBC: 4.95 MIL/uL (ref 3.87–5.11)
RDW: 13.3 % (ref 11.5–15.5)
WBC: 5.7 10*3/uL (ref 4.0–10.5)

## 2016-01-01 LAB — LIPID PANEL
Cholesterol: 214 mg/dL — ABNORMAL HIGH (ref 125–200)
HDL: 46 mg/dL (ref >=46)
LDL CALC: 143 mg/dL — AB (ref <130)
Total CHOL/HDL Ratio: 4.7 Ratio (ref <=5.0)
Triglycerides: 127 mg/dL (ref <150)
VLDL: 25 mg/dL (ref <30)

## 2016-01-01 LAB — TSH: TSH: 1.063 u[IU]/mL (ref 0.350–4.500)

## 2016-01-01 LAB — T4, FREE: Free T4: 1.3 ng/dL (ref 0.80–1.80)

## 2016-01-01 LAB — VITAMIN B12: VITAMIN B 12: 688 pg/mL (ref 211–911)

## 2016-01-01 LAB — HEPATITIS C ANTIBODY: HCV Ab: NEGATIVE

## 2016-01-01 LAB — HIV ANTIBODY (ROUTINE TESTING W REFLEX): HIV 1&2 Ab, 4th Generation: NONREACTIVE

## 2016-01-01 MED ORDER — OMEPRAZOLE 20 MG PO CPDR: 20.0000 mg | DELAYED_RELEASE_CAPSULE | Freq: Every day | ORAL | Status: DC

## 2016-01-01 MED ORDER — LEVOTHYROXINE SODIUM 112 MCG PO TABS: ORAL_TABLET | ORAL | Status: DC

## 2016-01-01 MED ORDER — ALBUTEROL SULFATE HFA 108 (90 BASE) MCG/ACT IN AERS: 2.0000 | INHALATION_SPRAY | Freq: Four times a day (QID) | RESPIRATORY_TRACT | Status: DC | PRN

## 2016-01-01 NOTE — Progress Notes (Signed)
Subjective:    Patient ID: Jane [name], female    DOB: [birth year], 56 y.o.   MRN: [id number]  01/01/2016  Medication Refill and Annual Exam   HPI This 55 y.o. female presents for Annual Wellness Examination.    Last physical:  11-07-2014 Pap smear:  10-20-2012 WNL Mammogram: 12-15-2014; due now; has received letter in mail plans to schedule. Colonoscopy: age 74; five more years.  Dr. Niel Hummer. Bone density: never TDAP:  2015 Influenza:  09-2015 Eye exam:  +glasses; scheduled in 01/2016 Dental exam:  Every six months.   Fibromyalgia:  Doing great; recent flare due to Christmas.     Review of Systems  Constitutional: Negative for fever, chills, diaphoresis, activity change, appetite change, fatigue and unexpected weight change.  HENT: Negative for congestion, dental problem, drooling, ear discharge, ear pain, facial swelling, hearing loss, mouth sores, nosebleeds, postnasal drip, rhinorrhea, sinus pressure, sneezing, sore throat, tinnitus, trouble swallowing and voice change.   Eyes: Negative for photophobia, pain, discharge, redness, itching and visual disturbance.  Respiratory: Negative for apnea, cough, choking, chest tightness, shortness of breath, wheezing and stridor.   Cardiovascular: Negative for chest pain, palpitations and leg swelling.  Gastrointestinal: Negative for nausea, vomiting, abdominal pain, diarrhea, constipation, blood in stool, abdominal distention, anal bleeding and rectal pain.  Endocrine: Negative for cold intolerance, heat intolerance, polydipsia, polyphagia and polyuria.  Genitourinary: Negative for dysuria, urgency, frequency, hematuria, flank pain, decreased urine volume, vaginal bleeding, vaginal discharge, enuresis, difficulty urinating, genital sores, vaginal pain, menstrual problem, pelvic pain and dyspareunia.  Musculoskeletal: Positive for arthralgias and neck pain. Negative for myalgias, back pain, joint swelling, gait problem and neck stiffness.   Skin: Negative for color change, pallor, rash and wound.  Allergic/Immunologic: Negative for environmental allergies, food allergies and immunocompromised state.  Neurological: Negative for dizziness, tremors, seizures, syncope, facial asymmetry, speech difficulty, weakness, light-headedness, numbness and headaches.  Hematological: Negative for adenopathy. Does not bruise/bleed easily.  Psychiatric/Behavioral: Negative for suicidal ideas, hallucinations, behavioral problems, confusion, sleep disturbance, self-injury, dysphoric mood, decreased concentration and agitation. The patient is not nervous/anxious and is not hyperactive.     Past Medical History  Diagnosis Date  . GERD (gastroesophageal reflux disease)   . HLD (hyperlipidemia)   . MR (mitral regurgitation)     moderate; probably MVP. echo 3/10 at [facility] cardiology with normal EF, moderate MR   . Atypical chest pain     hx   . Arthritis     Knees, feet, elbows , neck  . Fibromyalgia 12/04/2010    [facility] in [place]; Dr. Gwenevere Ghazi; followed every six months.  . Asthma     mild; assoc with seasonal allergies; uses Albuterol once per year on average.  Marland Kitchen Heart murmur     Mitral valve Prolapse  . Hypothyroidism post radioactive iodine    Hyperthyroidism; now secondary hypothyroidism   Past Surgical History  Procedure Laterality Date  . Knee surgery  7/10  . 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 Inner thigh surgery    . Left foot surgery      for plantar fascitis  . Novascue uterine ablation  2007  . Colonoscopy  12/04/2009    normal; Iftikhar   Allergies  Allergen Reactions  . Celebrex [Celecoxib] Swelling and Shortness Of Breath    Swelling. Developed Facial swelling    Current Outpatient Prescriptions  Medication Sig Dispense Refill  . levothyroxine (SYNTHROID, LEVOTHROID) 112 MCG tablet TAKE 1 TABLET EVERY DAY 30  MINUTES BEFORE BREAKFAST 90 tablet 3  . omeprazole (PRILOSEC) 20 MG capsule Take 1  capsule (20 mg total) by mouth daily. 90 capsule 3  . traMADol (ULTRAM) 50 MG tablet Take by mouth.    Marland Kitchen albuterol (PROVENTIL HFA;VENTOLIN HFA) 108 (90 Base) MCG/ACT inhaler Inhale 2 puffs into the lungs every 6 (six) hours as needed for wheezing or shortness of breath (cough, shortness of breath or wheezing.). 1 Inhaler 1   No current facility-administered medications for this visit.   Social History   Social History  . Marital Status: Married    Spouse Name: Charletta Cousin  . Number of Children: 0  . 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 Smoking status: Former Smoker    Types: Cigarettes  . Smokeless tobacco: Never Used     Comment: smoked 2ppd for 15 years; quit in 1995   . Alcohol Use: No     Comment: social, rare  . Drug Use: No  . Sexual Activity: Yes    Birth Control/ Protection: Post-menopausal   Other Topics Concern  . Not on file   Social History Narrative   Marital status:  Married x 21 years; happily married no abuse, no children.      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      Tobacco: none      Alcohol: none      Drugs: none       Exercise:  Does not get regular exercise.       Seatbelt: 100%      Guns:  Unloaded.  Has concealed carry.   Family History  Problem Relation Age of Onset  . Stroke Father 57    CVA  . COPD Father   . Hyperlipidemia Mother   . Heart disease Mother 53    CABG in 50s; AMI age 16  . Hypertension Mother   . Hypertension Sister        Objective:    BP 140/80 mmHg  Pulse 68  Temp(Src) 98.4 F (36.9 C) (Oral)  Resp 17  Ht 5' 7.32" (1.71 m)  Wt 201 lb (91.173 kg)  BMI 31.18 kg/m2  SpO2 98% Physical Exam  Constitutional: She is oriented to person, place, and time. She appears well-developed and well-nourished. No distress.  HENT:  Head: Normocephalic and atraumatic.  Right Ear: External ear normal.    Left Ear: External ear normal.  Nose: Nose normal.  Mouth/Throat: Oropharynx is clear and moist.  Eyes: Conjunctivae and EOM are normal. Pupils are equal, round, and reactive to light.  Neck: Normal range of motion. Neck supple. Carotid bruit is not present. No thyromegaly present.  Cardiovascular: Normal rate, regular rhythm, normal heart sounds and intact distal pulses.  Exam reveals no gallop and no friction rub.   No murmur heard. Pulmonary/Chest: Effort normal and breath sounds normal. She has no wheezes. She has no rales. Right breast exhibits no inverted nipple, no mass, no nipple discharge, no skin change and no tenderness. Left breast exhibits no inverted nipple, no mass, no nipple discharge, no skin change and no tenderness. Breasts are symmetrical.  Abdominal: Soft. Bowel sounds are normal. She exhibits no distension and no mass. There is no tenderness. There is no rebound and no guarding.  Genitourinary: Vagina normal and uterus normal. There is no  rash, tenderness or lesion on the right labia. There is no rash, tenderness or lesion on the left labia. Cervix exhibits no motion tenderness, no discharge and no friability. Right adnexum displays no mass, no tenderness and no fullness. Left adnexum displays no mass, no tenderness and no fullness. No signs of injury around the vagina. No vaginal discharge found.  Lymphadenopathy:    She has no cervical adenopathy.  Neurological: She is alert and oriented to person, place, and time. No cranial nerve deficit.  Skin: Skin is warm and dry. No rash noted. She is not diaphoretic. No erythema. No pallor.  Nevus L upper shoulder without irregularity or color variation yet prominent.   Psychiatric: She has a normal mood and affect. Her behavior is normal.        Assessment & Plan:   1. Routine physical examination   2. Hyperlipidemia   3. Hypothyroidism due to acquired atrophy of thyroid   4. Glucose intolerance (impaired glucose tolerance)    5. Obesity   6. Encounter for routine gynecological examination   7. Fibromyalgia   8. Gastroesophageal reflux disease without esophagitis   9. Need for hepatitis C screening test   10. Screening for HIV (human immunodeficiency virus)   11. Asthma, mild intermittent, uncomplicated     -anticipatory guidance --- weight loss, exercise, low-caloric food choices.  Good success in past with [employer]. -pap smear obtained. Due for mammogram. -colonoscopy UTD. -Immunizations UTD. -recommend Calcium  daily and vitamin D 800 IU daily due to hypothyroidism and post-menopausal state. -obtain age appropriate and disease appropriate labs as outlined. -refills provided.   Orders Placed This Encounter  Procedures  . CBC with Differential/Platelet  . Comprehensive metabolic panel    Order Specific Question:  Has the patient fasted?    Answer:  Yes  . Hemoglobin A1c  . Lipid panel    Order Specific Question:  Has the patient fasted?    Answer:  Yes  . TSH  . T4, free  . Vitamin B12  . VITAMIN D 25 Hydroxy (Vit-D Deficiency, Fractures)  . HIV antibody  . Hepatitis C antibody   Meds ordered this encounter  Medications  . omeprazole (PRILOSEC) 20 MG capsule    Sig: Take 1 capsule (20 mg total) by mouth daily.    Dispense:  90 capsule    Refill:  3  . levothyroxine (SYNTHROID, LEVOTHROID) 112 MCG tablet    Sig: TAKE 1 TABLET EVERY DAY 30 MINUTES BEFORE BREAKFAST    Dispense:  90 tablet    Refill:  3  . albuterol (PROVENTIL HFA;VENTOLIN HFA) 108 (90 Base) MCG/ACT inhaler    Sig: Inhale 2 puffs into the lungs every 6 (six) hours as needed for wheezing or shortness of breath (cough, shortness of breath or wheezing.).    Dispense:  1 Inhaler    Refill:  1    PLEASE USE ALBUTEROL HFA PREFERRED BY INSURANCE.    No Follow-up on file.    Carmie Lanpher Paulita Fujita, M.D. [facility] [address] [place], [place]  [address] [phone] phone [phone] fax

## 2016-01-01 NOTE — Patient Instructions (Signed)

## 2016-01-02 LAB — HEMOGLOBIN A1C
HEMOGLOBIN A1C: 5.7 % — AB (ref <5.7)
Mean Plasma Glucose: 117 mg/dL — ABNORMAL HIGH (ref <117)

## 2016-01-03 LAB — VITAMIN D 25 HYDROXY (VIT D DEFICIENCY, FRACTURES): VIT D 25 HYDROXY: 24 ng/mL — AB (ref 30–100)

## 2016-01-04 LAB — PAP IG AND HPV HIGH-RISK: HPV DNA HIGH RISK: NOT DETECTED

## 2016-01-10 ENCOUNTER — Telehealth: Payer: Self-pay

## 2016-01-10 NOTE — Telephone Encounter (Signed)
Pt called requesting lab results. Please review labs.  Thank you.  

## 2016-01-13 NOTE — Telephone Encounter (Signed)
Please call patient with lab results

## 2016-01-18 NOTE — Telephone Encounter (Signed)
Reported labs to patient as listed in the result note. Patient verbalized understanding.

## 2016-03-14 DIAGNOSIS — M7711 Lateral epicondylitis, right elbow: Secondary | ICD-10-CM | POA: Diagnosis not present

## 2016-03-31 ENCOUNTER — Encounter: Payer: Federal, State, Local not specified - PPO | Admitting: Family Medicine

## 2016-04-06 DIAGNOSIS — J209 Acute bronchitis, unspecified: Secondary | ICD-10-CM | POA: Diagnosis not present

## 2016-04-06 DIAGNOSIS — J019 Acute sinusitis, unspecified: Secondary | ICD-10-CM | POA: Diagnosis not present

## 2016-04-18 ENCOUNTER — Encounter: Payer: Federal, State, Local not specified - PPO | Admitting: Family Medicine

## 2016-04-20 DIAGNOSIS — K080 Exfoliation of teeth due to systemic causes: Secondary | ICD-10-CM | POA: Diagnosis not present

## 2016-05-11 DIAGNOSIS — M1811 Unilateral primary osteoarthritis of first carpometacarpal joint, right hand: Secondary | ICD-10-CM | POA: Diagnosis not present

## 2016-05-11 DIAGNOSIS — M25511 Pain in right shoulder: Secondary | ICD-10-CM | POA: Diagnosis not present

## 2016-06-15 DIAGNOSIS — M1811 Unilateral primary osteoarthritis of first carpometacarpal joint, right hand: Secondary | ICD-10-CM | POA: Diagnosis not present

## 2016-06-15 DIAGNOSIS — M7711 Lateral epicondylitis, right elbow: Secondary | ICD-10-CM | POA: Diagnosis not present

## 2016-08-02 DIAGNOSIS — M7711 Lateral epicondylitis, right elbow: Secondary | ICD-10-CM | POA: Diagnosis not present

## 2016-08-02 DIAGNOSIS — M1811 Unilateral primary osteoarthritis of first carpometacarpal joint, right hand: Secondary | ICD-10-CM | POA: Diagnosis not present

## 2016-08-23 ENCOUNTER — Encounter: Payer: Self-pay | Admitting: Family Medicine

## 2016-08-23 ENCOUNTER — Ambulatory Visit (INDEPENDENT_AMBULATORY_CARE_PROVIDER_SITE_OTHER): Payer: Federal, State, Local not specified - PPO | Admitting: Family Medicine

## 2016-08-23 VITALS — BP 128/72 | HR 87 | Temp 98.5°F | Resp 18 | Ht 67.25 in | Wt 205.2 lb

## 2016-08-23 DIAGNOSIS — K219 Gastro-esophageal reflux disease without esophagitis: Secondary | ICD-10-CM | POA: Diagnosis not present

## 2016-08-23 DIAGNOSIS — Z23 Encounter for immunization: Secondary | ICD-10-CM | POA: Diagnosis not present

## 2016-08-23 DIAGNOSIS — R6881 Early satiety: Secondary | ICD-10-CM | POA: Diagnosis not present

## 2016-08-23 DIAGNOSIS — E559 Vitamin D deficiency, unspecified: Secondary | ICD-10-CM

## 2016-08-23 LAB — CBC WITH DIFFERENTIAL/PLATELET
BASOS PCT: 1 %
Basophils Absolute: 56 cells/uL (ref 0–200)
EOS ABS: 168 {cells}/uL (ref 15–500)
Eosinophils Relative: 3 %
HCT: 41.4 % (ref 35.0–45.0)
Hemoglobin: 13.5 g/dL (ref 11.7–15.5)
Lymphocytes Relative: 28 %
Lymphs Abs: 1568 cells/uL (ref 850–3900)
MCH: 28.7 pg (ref 27.0–33.0)
MCHC: 32.6 g/dL (ref 32.0–36.0)
MCV: 88.1 fL (ref 80.0–100.0)
MONOS PCT: 8 %
MPV: 9.6 fL (ref 7.5–12.5)
Monocytes Absolute: 448 cells/uL (ref 200–950)
NEUTROS ABS: 3360 {cells}/uL (ref 1500–7800)
Neutrophils Relative %: 60 %
PLATELETS: 229 10*3/uL (ref 140–400)
RBC: 4.7 MIL/uL (ref 3.80–5.10)
RDW: 13.3 % (ref 11.0–15.0)
WBC: 5.6 10*3/uL (ref 3.8–10.8)

## 2016-08-23 LAB — COMPREHENSIVE METABOLIC PANEL
ALK PHOS: 70 U/L (ref 33–130)
ALT: 18 U/L (ref 6–29)
AST: 18 U/L (ref 10–35)
Albumin: 4.3 g/dL (ref 3.6–5.1)
BILIRUBIN TOTAL: 0.3 mg/dL (ref 0.2–1.2)
BUN: 19 mg/dL (ref 7–25)
CO2: 28 mmol/L (ref 20–31)
CREATININE: 0.8 mg/dL (ref 0.50–1.05)
Calcium: 9.5 mg/dL (ref 8.6–10.4)
Chloride: 106 mmol/L (ref 98–110)
GLUCOSE: 110 mg/dL — AB (ref 65–99)
Potassium: 4.2 mmol/L (ref 3.5–5.3)
SODIUM: 142 mmol/L (ref 135–146)
TOTAL PROTEIN: 6.7 g/dL (ref 6.1–8.1)

## 2016-08-23 LAB — LIPASE: LIPASE: 52 U/L (ref 7–60)

## 2016-08-23 LAB — AMYLASE: AMYLASE: 44 U/L (ref 0–105)

## 2016-08-23 MED ORDER — VITAMIN D (ERGOCALCIFEROL) 1.25 MG (50000 UNIT) PO CAPS: 50000.0000 [IU] | ORAL_CAPSULE | ORAL | 1 refills | Status: DC

## 2016-08-23 MED ORDER — RANITIDINE HCL 150 MG PO TABS
150.0000 mg | ORAL_TABLET | Freq: Two times a day (BID) | ORAL | 3 refills | Status: DC
Start: 2016-08-23 — End: 2016-10-30

## 2016-08-23 NOTE — Progress Notes (Signed)
Subjective:  By signing my name below, I, Jane [name], attest that this documentation has been prepared under the direction and in the presence of Jane SimmerKristi Estella Malatesta, MD. Electronically Signed: Stann Oresung-Kai [name], Scribe. 08/23/2016 , 2:22 PM .  Patient was seen in Room 4 .   Patient ID: Jane [name], female    DOB: 01-07-1961, 55 y.o.   MRN: [id number]  08/23/2016  Heartburn (x3 months. Occasional chest pain. )   HPI [name] is a 55 y.o. female who [facility] complaining of reflux with occasional chest discomfort for past 3 months. Patient notes having chest discomfort after drinking morning smoothie with a mix of fruits and vegetables. She wasn't sure which ingredient is causing it, until she drank some grape wine which resulted in similar chest discomfort. She also had shortness of breath causing her to use her inhaler. She's still walking at work without chest pain.   She also felt reflux go into her throat after eating a grilled chicken sandwich. She's been taking 2 omeprazole in the last 2~3 months (takes 1 pill an hour before lunch, and if symptoms persists, she takes another pill afterwards). She informs eating anything burns, "even eating a Frosty and would cause burning down her throat". She would have this reflux when she lays down at night if she doesn't take her second omeprazole. She also mentions being full faster with bloating. She had a colonoscopy done at [facility]. She denies taking Vitamin D currently. She would like to take weekly Vitamin D.   She also received her flu shot today.   Review of Systems  Constitutional: Negative for chills, diaphoresis, fatigue and fever.  HENT: Negative for ear pain, postnasal drip, rhinorrhea, sinus pressure, sore throat and trouble swallowing.   Eyes: Negative for visual disturbance.  Respiratory: Positive for chest tightness and shortness of breath. Negative for cough.   Cardiovascular: Negative for chest pain,  palpitations and leg swelling.  Gastrointestinal: Positive for abdominal distention. Negative for abdominal pain, constipation, diarrhea, nausea and vomiting.  Endocrine: Negative for cold intolerance, heat intolerance, polydipsia, polyphagia and polyuria.  Neurological: Negative for dizziness, tremors, seizures, syncope, facial asymmetry, speech difficulty, weakness, light-headedness, numbness and headaches.    Past Medical History:  Diagnosis Date  . Arthritis    Knees, feet, elbows , neck  . Asthma    mild; assoc with seasonal allergies; uses Albuterol once per year on average.  Marland Kitchen. Atypical chest pain    hx   . Fibromyalgia 12/04/2010   [facility] in [place]; Dr. Gwenevere GhaziNicole Keltt; followed every six months.  Marland Kitchen. GERD (gastroesophageal reflux disease)   . Heart murmur    Mitral valve Prolapse  . HLD (hyperlipidemia)   . Hypothyroidism post radioactive iodine   Hyperthyroidism; now secondary hypothyroidism  . MR (mitral regurgitation)    moderate; probably MVP. [facility] 3/[facility] with normal EF, moderate MR    Past Surgical History:  Procedure Laterality Date  . COLONOSCOPY  12/04/2009   normal; Iftikhar  . inner thigh surgery    . KNEE SURGERY  7/10  . 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 left foot surgery     for plantar fascitis  . novascue uterine ablation  2007   Allergies  Allergen Reactions  . Celebrex [Celecoxib] Swelling and Shortness Of Breath    Swelling. Developed Facial swelling     Social History   Social History  . Marital status: Married  Spouse name: Charletta Cousin  . Number of children: 0  . 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 Smoking status: Former Smoker    Types: Cigarettes  . Smokeless tobacco: Never Used     Comment: smoked 2ppd for 15 years; quit in 1995   . Alcohol use No     Comment: social, rare  . Drug use: No  . Sexual activity: Yes    Birth control/  protection: Post-menopausal   Other Topics Concern  . Not on file   Social History Narrative   Marital status:  Married x 21 years; happily married no abuse, no children.      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      Tobacco: none      Alcohol: none      Drugs: none       Exercise:  Does not get regular exercise.       Seatbelt: 100%      Guns:  Unloaded.  Has concealed carry.   Family History  Problem Relation Age of Onset  . Stroke Father 22    CVA  . COPD Father   . Hyperlipidemia Mother   . Heart disease Mother 51    CABG in 76s; AMI age 20  . Hypertension Mother   . Hypertension Sister        Objective:    BP 128/72   Pulse 87   Temp 98.5 F (36.9 C) (Oral)   Resp 18   Ht 5' 7.25" (1.708 m)   Wt 205 lb 3.2 oz (93.1 kg)   SpO2 97%   BMI 31.90 kg/m   Physical Exam  Constitutional: She is oriented to person, place, and time. She appears well-developed and well-nourished. No distress.  HENT:  Head: Normocephalic and atraumatic.  Right Ear: External ear normal.  Left Ear: External ear normal.  Nose: Nose normal.  Mouth/Throat: Oropharynx is clear and moist.  Eyes: Conjunctivae and EOM are normal. Pupils are equal, round, and reactive to light.  Neck: Normal range of motion. Neck supple. Carotid bruit is not present. No thyromegaly present.  Cardiovascular: Normal rate, regular rhythm, normal heart sounds and intact distal pulses.  Exam reveals no gallop and no friction rub.   No murmur heard. Pulmonary/Chest: Effort normal and breath sounds normal. She has no wheezes. She has no rales.  Abdominal: Soft. Bowel sounds are normal. She exhibits no distension and no mass. There is tenderness in the periumbilical area. There is no rebound and no guarding.  Lymphadenopathy:    She has no cervical adenopathy.  Neurological: She is alert and oriented to person, place, and time. No cranial nerve  deficit.  Skin: Skin is warm and dry. No rash noted. She is not diaphoretic. No erythema. No pallor.  Psychiatric: She has a normal mood and affect. Her behavior is normal.        Assessment & Plan:   1. Gastroesophageal reflux disease without esophagitis   2. Early satiety   3. Vitamin D deficiency   4. Flu vaccine need    -uncontrolled; add Zantac qhs; continue Omeprazole daily. -obtain labs. -refer to GI due to ongoing issues with GERD and now with early satiety; warrants EGD if no improvement with addition of Zantac. -refill of Vitamin D provided.   Orders  Placed This Encounter  Procedures  . Flu Vaccine QUAD 36+ mos IM  . CBC with Differential/Platelet  . Comprehensive metabolic panel  . Lipase  . Amylase  . Ambulatory referral to Gastroenterology    Referral Priority:   Routine    Referral Type:   Consultation    Referral Reason:   Specialty Services Required    Number of Visits Requested:   1   Meds ordered this encounter  Medications  . meloxicam (MOBIC) 15 MG tablet    Sig: Take 15 mg by mouth daily.  . ranitidine (ZANTAC) 150 MG tablet    Sig: Take 1 tablet (150 mg total) by mouth 2 (two) times daily.    Dispense:  60 tablet    Refill:  3  . Vitamin D, Ergocalciferol, (DRISDOL) 50000 units CAPS capsule    Sig: Take 1 capsule (50,000 Units total) by mouth every 7 (seven) days.    Dispense:  12 capsule    Refill:  1    No Follow-up on file.

## 2016-08-23 NOTE — Patient Instructions (Addendum)
  1. Continue Omeprazole 20mg  one tablet one hour before lunch. 2.  Add Zantac 150mg  one tablet before supper.   IF you received an x-ray today, you will receive an invoice from [facility]. Please contact [facility] at [phone] with questions or concerns regarding your invoice.   IF you received labwork today, you will receive an invoice from [facility]. Please contact [facility] at [phone] with questions or concerns regarding your invoice.   Our billing staff will not be able to assist you with questions regarding bills from these companies.  You will be contacted with the lab results as soon as they are available. The fastest way to get your results is to activate your My Chart account. Instructions are located on the last page of this paperwork. If you have not heard from us regarding the results in 2 weeks, please contact this office.     Food Choices for Gastroesophageal Reflux Disease, Adult When you have gastroesophageal reflux disease (GERD), the foods you eat and your eating habits are very important. Choosing the right foods can help ease the discomfort of GERD. WHAT GENERAL GUIDELINES DO I NEED TO FOLLOW?  Choose fruits, vegetables, whole grains, low-fat dairy products, and low-fat meat, fish, and poultry.  Limit fats such as oils, salad dressings, butter, nuts, and avocado.  Keep a food diary to identify foods that cause symptoms.  Avoid foods that cause reflux. These may be different for different people.  Eat frequent small meals instead of three large meals each day.  Eat your meals slowly, in a relaxed setting.  Limit fried foods.  Cook foods using methods other than frying.  Avoid drinking alcohol.  Avoid drinking large amounts of liquids with your meals.  Avoid bending over or lying down until 2-3 hours after eating. WHAT FOODS ARE NOT RECOMMENDED? The following are some foods and drinks that may worsen  your symptoms: Vegetables Tomatoes. Tomato juice. Tomato and spaghetti sauce. Chili peppers. Onion and garlic. Horseradish. Fruits Oranges, grapefruit, and lemon (fruit and juice). Meats High-fat meats, fish, and poultry. This includes hot dogs, ribs, ham, sausage, salami, and bacon. Dairy Whole milk and chocolate milk. Sour cream. Cream. Butter. Ice cream. Cream cheese.  Beverages Coffee and tea, with or without caffeine. Carbonated beverages or energy drinks. Condiments Hot sauce. Barbecue sauce.  Sweets/Desserts Chocolate and cocoa. Donuts. Peppermint and spearmint. Fats and Oils High-fat foods, including [place] fries and potato chips. Other Vinegar. Strong spices, such as black pepper, white pepper, red pepper, cayenne, curry powder, cloves, ginger, and chili powder. The items listed above may not be a complete list of foods and beverages to avoid. Contact your dietitian for more information.   This information is not intended to replace advice given to you by your health care provider. Make sure you discuss any questions you have with your health care provider.   Document Released: 11/20/2005 Document Revised: 12/11/2014 Document Reviewed: 09/24/2013 Elsevier Interactive Patient Education [employer]2016 Elsevier Inc.

## 2016-09-13 ENCOUNTER — Telehealth: Payer: Self-pay | Admitting: *Deleted

## 2016-09-13 NOTE — Telephone Encounter (Signed)
Patient called about lab results advised that labs were all normal.  She stated that she was not fasting which could be the the cause of the high blood sugar.  Is there anything you would like to advise.

## 2016-09-16 NOTE — Telephone Encounter (Signed)
How are patient's reflux symptoms at this time?  Have they improved since her visit?

## 2016-09-18 NOTE — Telephone Encounter (Signed)
LM with husband to have patient to call office back

## 2016-09-19 ENCOUNTER — Other Ambulatory Visit: Payer: Self-pay | Admitting: Gastroenterology

## 2016-09-19 DIAGNOSIS — K080 Exfoliation of teeth due to systemic causes: Secondary | ICD-10-CM | POA: Diagnosis not present

## 2016-09-19 DIAGNOSIS — R0789 Other chest pain: Secondary | ICD-10-CM | POA: Diagnosis not present

## 2016-09-19 DIAGNOSIS — R1310 Dysphagia, unspecified: Secondary | ICD-10-CM

## 2016-09-19 NOTE — Telephone Encounter (Signed)
lmom to cb. 

## 2016-09-20 NOTE — Telephone Encounter (Signed)
Pt has not called back. LMOM advising that Dr Katrinka BlazingSmith wanted us to check w/her about her reflux Sxs. Asked for CB if she has not improved since OV. If she is no longer having Sxs she does not need to CB.

## 2016-09-23 DIAGNOSIS — E559 Vitamin D deficiency, unspecified: Secondary | ICD-10-CM | POA: Insufficient documentation

## 2016-09-25 ENCOUNTER — Ambulatory Visit
Admission: RE | Admit: 2016-09-25 | Discharge: 2016-09-25 | Disposition: A | Discharge status: Home / Self Care | Payer: Federal, State, Local not specified - PPO | Source: Ambulatory Visit | Attending: Gastroenterology | Admitting: Gastroenterology

## 2016-09-25 DIAGNOSIS — K449 Diaphragmatic hernia without obstruction or gangrene: Secondary | ICD-10-CM | POA: Diagnosis not present

## 2016-09-25 DIAGNOSIS — R1310 Dysphagia, unspecified: Secondary | ICD-10-CM | POA: Insufficient documentation

## 2016-09-25 DIAGNOSIS — M2578 Osteophyte, vertebrae: Secondary | ICD-10-CM | POA: Insufficient documentation

## 2016-10-12 ENCOUNTER — Encounter
Admission: RE | Admit: 2016-10-12 | Discharge: 2016-10-12 | Disposition: A | Discharge status: Home / Self Care | Source: Ambulatory Visit | Attending: Orthopedic Surgery | Admitting: Orthopedic Surgery

## 2016-10-12 DIAGNOSIS — J45909 Unspecified asthma, uncomplicated: Secondary | ICD-10-CM | POA: Diagnosis not present

## 2016-10-12 DIAGNOSIS — M1990 Unspecified osteoarthritis, unspecified site: Secondary | ICD-10-CM | POA: Diagnosis not present

## 2016-10-12 DIAGNOSIS — Z0183 Encounter for blood typing: Secondary | ICD-10-CM | POA: Insufficient documentation

## 2016-10-12 DIAGNOSIS — Z01812 Encounter for preprocedural laboratory examination: Secondary | ICD-10-CM | POA: Diagnosis not present

## 2016-10-12 DIAGNOSIS — R001 Bradycardia, unspecified: Secondary | ICD-10-CM | POA: Insufficient documentation

## 2016-10-12 DIAGNOSIS — Z01818 Encounter for other preprocedural examination: Secondary | ICD-10-CM | POA: Insufficient documentation

## 2016-10-12 HISTORY — DX: Adverse effect of unspecified anesthetic, initial encounter: T41.45XA

## 2016-10-12 HISTORY — DX: Other complications of anesthesia, initial encounter: T88.59XA

## 2016-10-12 LAB — URINALYSIS COMPLETE WITH MICROSCOPIC (ARMC ONLY)
BILIRUBIN URINE: NEGATIVE
Bacteria, UA: NONE SEEN
GLUCOSE, UA: NEGATIVE mg/dL
KETONES UR: NEGATIVE mg/dL
Leukocytes, UA: NEGATIVE
Nitrite: NEGATIVE
PH: 6 (ref 5.0–8.0)
Protein, ur: NEGATIVE mg/dL
SQUAMOUS EPITHELIAL / LPF: NONE SEEN
Specific Gravity, Urine: 1.015 (ref 1.005–1.030)
WBC, UA: NONE SEEN WBC/hpf (ref 0–5)

## 2016-10-12 LAB — CBC
HCT: 42.6 % (ref 35.0–47.0)
HEMOGLOBIN: 14.4 g/dL (ref 12.0–16.0)
MCH: 29.1 pg (ref 26.0–34.0)
MCHC: 33.9 g/dL (ref 32.0–36.0)
MCV: 85.9 fL (ref 80.0–100.0)
PLATELETS: 214 10*3/uL (ref 150–440)
RBC: 4.95 MIL/uL (ref 3.80–5.20)
RDW: 13.1 % (ref 11.5–14.5)
WBC: 5.2 10*3/uL (ref 3.6–11.0)

## 2016-10-12 LAB — COMPREHENSIVE METABOLIC PANEL
ALK PHOS: 68 U/L (ref 38–126)
ALT: 22 U/L (ref 14–54)
ANION GAP: 6 (ref 5–15)
AST: 21 U/L (ref 15–41)
Albumin: 4.3 g/dL (ref 3.5–5.0)
BUN: 16 mg/dL (ref 6–20)
CALCIUM: 9.4 mg/dL (ref 8.9–10.3)
CHLORIDE: 105 mmol/L (ref 101–111)
CO2: 28 mmol/L (ref 22–32)
CREATININE: 0.59 mg/dL (ref 0.44–1.00)
GFR calc Af Amer: >60 mL/min (ref >60)
GFR calc non Af Amer: >60 mL/min (ref >60)
Glucose, Bld: 90 mg/dL (ref 65–99)
Potassium: 4.1 mmol/L (ref 3.5–5.1)
SODIUM: 139 mmol/L (ref 135–145)
Total Bilirubin: 0.4 mg/dL (ref 0.3–1.2)
Total Protein: 7.3 g/dL (ref 6.5–8.1)

## 2016-10-12 LAB — APTT: aPTT: 35 seconds (ref 24–36)

## 2016-10-12 LAB — SURGICAL PCR SCREEN
MRSA, PCR: NEGATIVE
STAPHYLOCOCCUS AUREUS: NEGATIVE

## 2016-10-12 LAB — C-REACTIVE PROTEIN: CRP: <0.8 mg/dL (ref <1.0)

## 2016-10-12 LAB — PROTIME-INR
INR: 0.98
Prothrombin Time: 13 seconds (ref 11.4–15.2)

## 2016-10-12 LAB — TYPE AND SCREEN
ABO/RH(D): O POS
Antibody Screen: NEGATIVE

## 2016-10-12 LAB — SEDIMENTATION RATE: Sed Rate: 7 mm/hr (ref 0–30)

## 2016-10-12 NOTE — Patient Instructions (Signed)
  Your procedure is scheduled on: 10/25/16 Wed Report to Same Day Surgery 2nd floor medical mall To find out your arrival time please call [phone] between 1PM - 3PM on 10/24/16 Tues  Remember: Instructions that are not followed completely may result in serious medical risk, up to and including death, or upon the discretion of your surgeon and anesthesiologist your surgery may need to be rescheduled.    _x___ 1. Do not eat food or drink liquids after midnight. No gum chewing or hard candies.     __x__ 2. No Alcohol for 24 hours before or after surgery.   __x__3. No Smoking for 24 prior to surgery.   ____  4. Bring all medications with you on the day of surgery if instructed.    __x__ 5. Notify your doctor if there is any change in your medical condition     (cold, fever, infections).     Do not wear jewelry, make-up, hairpins, clips or nail polish.  Do not wear lotions, powders, or perfumes. You may wear deodorant.  Do not shave 48 hours prior to surgery. Men may shave face and neck.  Do not bring valuables to the hospital.    [facility] not responsible [facility] any belongings or valuables.               Contacts, dentures or bridgework may not be worn into surgery.  Leave your suitcase in the car. After surgery it may be brought to your room.  For patients admitted to the hospital, discharge time is determined by your treatment team.   Patients discharged the day of surgery will not be allowed to drive home.    Please read over the following fact sheets that you were given:   [facility] Pre[facility] and or MRSA Information   _x___ Take these medicines the morning of surgery with A SIP OF WATER:    1. albuterol (PROVENTIL   2.levothyroxine (SYNTHROID, LEVOTHROID) 112 MCG tablet  3.pantoprazole (PROTONIX) 40 MG tablet  4.traMADol (ULTRAM) 50 MG tablet  5.  6.  ____Fleets enema or Magnesium Citrate as directed.   _x___ Use CHG Soap or sage wipes as directed on  instruction sheet   ____ Use inhalers on the day of surgery and bring to hospital day of surgery  ____ Stop metformin 2 days prior to surgery    ____ Take 1/2 of usual insulin dose the night before surgery and none on the morning of           surgery.   ____ Stop aspirin or coumadin, or plavix  x__ Stop Anti-inflammatories such as Advil, Aleve, Ibuprofen, Motrin, Naproxen,          Naprosyn, Goodies powders or aspirin products. Ok to take Tylenol.   ____ Stop supplements until after surgery.    ____ Bring C-Pap to the hospital.

## 2016-10-13 LAB — URINE CULTURE
CULTURE: NO GROWTH
SPECIAL REQUESTS: NORMAL

## 2016-10-25 ENCOUNTER — Inpatient Hospital Stay: Admitting: Registered Nurse

## 2016-10-25 ENCOUNTER — Encounter
Admission: RE | Disposition: A | Discharge status: Home / Self Care | Payer: Self-pay | Source: Ambulatory Visit | Attending: Orthopedic Surgery

## 2016-10-25 ENCOUNTER — Inpatient Hospital Stay

## 2016-10-25 ENCOUNTER — Inpatient Hospital Stay
Admission: RE | Admit: 2016-10-25 | Discharge: 2016-10-27 | DRG: 470 | Disposition: A | Discharge status: Home / Self Care | Source: Ambulatory Visit | Attending: Orthopedic Surgery | Admitting: Orthopedic Surgery

## 2016-10-25 ENCOUNTER — Encounter: Payer: Self-pay | Admitting: Orthopedic Surgery

## 2016-10-25 DIAGNOSIS — I341 Nonrheumatic mitral (valve) prolapse: Secondary | ICD-10-CM | POA: Diagnosis present

## 2016-10-25 DIAGNOSIS — J45909 Unspecified asthma, uncomplicated: Secondary | ICD-10-CM | POA: Diagnosis present

## 2016-10-25 DIAGNOSIS — Z96659 Presence of unspecified artificial knee joint: Secondary | ICD-10-CM

## 2016-10-25 DIAGNOSIS — E032 Hypothyroidism due to medicaments and other exogenous substances: Secondary | ICD-10-CM | POA: Diagnosis present

## 2016-10-25 DIAGNOSIS — E669 Obesity, unspecified: Secondary | ICD-10-CM | POA: Diagnosis present

## 2016-10-25 DIAGNOSIS — Z6832 Body mass index (BMI) 32.0-32.9, adult: Secondary | ICD-10-CM | POA: Diagnosis not present

## 2016-10-25 DIAGNOSIS — K219 Gastro-esophageal reflux disease without esophagitis: Secondary | ICD-10-CM | POA: Diagnosis present

## 2016-10-25 DIAGNOSIS — Z888 Allergy status to other drugs, medicaments and biological substances status: Secondary | ICD-10-CM | POA: Diagnosis not present

## 2016-10-25 DIAGNOSIS — M797 Fibromyalgia: Secondary | ICD-10-CM | POA: Diagnosis present

## 2016-10-25 DIAGNOSIS — E785 Hyperlipidemia, unspecified: Secondary | ICD-10-CM | POA: Diagnosis present

## 2016-10-25 DIAGNOSIS — I340 Nonrheumatic mitral (valve) insufficiency: Secondary | ICD-10-CM | POA: Diagnosis present

## 2016-10-25 DIAGNOSIS — Z79899 Other long term (current) drug therapy: Secondary | ICD-10-CM

## 2016-10-25 DIAGNOSIS — F419 Anxiety disorder, unspecified: Secondary | ICD-10-CM | POA: Diagnosis present

## 2016-10-25 DIAGNOSIS — M6281 Muscle weakness (generalized): Secondary | ICD-10-CM

## 2016-10-25 DIAGNOSIS — M1711 Unilateral primary osteoarthritis, right knee: Secondary | ICD-10-CM | POA: Diagnosis not present

## 2016-10-25 DIAGNOSIS — Z471 Aftercare following joint replacement surgery: Secondary | ICD-10-CM | POA: Diagnosis not present

## 2016-10-25 DIAGNOSIS — Z96651 Presence of right artificial knee joint: Secondary | ICD-10-CM | POA: Diagnosis not present

## 2016-10-25 DIAGNOSIS — E559 Vitamin D deficiency, unspecified: Secondary | ICD-10-CM | POA: Diagnosis present

## 2016-10-25 DIAGNOSIS — R262 Difficulty in walking, not elsewhere classified: Secondary | ICD-10-CM

## 2016-10-25 HISTORY — PX: KNEE ARTHROPLASTY: SHX992

## 2016-10-25 SURGERY — ARTHROPLASTY, KNEE, TOTAL, USING IMAGELESS COMPUTER-ASSISTED NAVIGATION
Anesthesia: Spinal | Site: Knee | Laterality: Right | Wound class: Clean

## 2016-10-25 MED ORDER — MENTHOL 3 MG MT LOZG
1.0000 | LOZENGE | OROMUCOSAL | Status: DC | PRN
  Filled 2016-10-25: qty 9

## 2016-10-25 MED ORDER — ONDANSETRON HCL 4 MG PO TABS: 4.0000 mg | ORAL_TABLET | Freq: Four times a day (QID) | ORAL | Status: DC | PRN

## 2016-10-25 MED ORDER — SODIUM CHLORIDE 0.9 % IV SOLN
INTRAVENOUS | Status: DC | PRN
  Administered 2016-10-25: 60 mL

## 2016-10-25 MED ORDER — NEOMYCIN-POLYMYXIN B GU 40-200000 IR SOLN
Status: DC | PRN
Start: 2016-10-25 — End: 2016-10-25
  Administered 2016-10-25: 14 mL

## 2016-10-25 MED ORDER — SENNOSIDES-DOCUSATE SODIUM 8.6-50 MG PO TABS
1.0000 | ORAL_TABLET | Freq: Two times a day (BID) | ORAL | Status: DC
  Administered 2016-10-25 – 2016-10-27 (×4): 1 via ORAL
  Filled 2016-10-25 (×4): qty 1

## 2016-10-25 MED ORDER — LACTATED RINGERS IV SOLN
INTRAVENOUS | Status: DC | PRN
  Administered 2016-10-25 (×2): via INTRAVENOUS

## 2016-10-25 MED ORDER — LIDOCAINE HCL (CARDIAC) 20 MG/ML IV SOLN
INTRAVENOUS | Status: DC | PRN
Start: 2016-10-25 — End: 2016-10-25
  Administered 2016-10-25: 40 mg via INTRAVENOUS

## 2016-10-25 MED ORDER — BUPIVACAINE HCL (PF) 0.5 % IJ SOLN
INTRAMUSCULAR | Status: DC | PRN
  Administered 2016-10-25: 2 mL

## 2016-10-25 MED ORDER — SODIUM CHLORIDE 0.9 % IV SOLN
INTRAVENOUS | Status: DC
  Administered 2016-10-25 – 2016-10-26 (×2): via INTRAVENOUS

## 2016-10-25 MED ORDER — ALBUTEROL SULFATE (2.5 MG/3ML) 0.083% IN NEBU: 2.5000 mg | INHALATION_SOLUTION | Freq: Four times a day (QID) | RESPIRATORY_TRACT | Status: DC | PRN

## 2016-10-25 MED ORDER — PHENOL 1.4 % MT LIQD
1.0000 | OROMUCOSAL | Status: DC | PRN
  Filled 2016-10-25: qty 177

## 2016-10-25 MED ORDER — ALUM & MAG HYDROXIDE-SIMETH 200-200-20 MG/5ML PO SUSP: 30.0000 mL | ORAL | Status: DC | PRN

## 2016-10-25 MED ORDER — TRANEXAMIC ACID 1000 MG/10ML IV SOLN
1000.0000 mg | INTRAVENOUS | Status: DC
  Filled 2016-10-25: qty 10

## 2016-10-25 MED ORDER — ACETAMINOPHEN 650 MG RE SUPP
650.0000 mg | Freq: Four times a day (QID) | RECTAL | Status: DC | PRN
Start: 2016-10-25 — End: 2016-10-27

## 2016-10-25 MED ORDER — PROMETHAZINE HCL 25 MG/ML IJ SOLN: 6.2500 mg | INTRAMUSCULAR | Status: DC | PRN

## 2016-10-25 MED ORDER — SODIUM CHLORIDE 0.9 % IV SOLN
INTRAVENOUS | Status: DC | PRN
  Administered 2016-10-25: 1000 mg via INTRAVENOUS

## 2016-10-25 MED ORDER — ENOXAPARIN SODIUM 30 MG/0.3ML ~~LOC~~ SOLN
30.0000 mg | Freq: Two times a day (BID) | SUBCUTANEOUS | Status: DC
  Administered 2016-10-26 – 2016-10-27 (×3): 30 mg via SUBCUTANEOUS
  Filled 2016-10-25 (×5): qty 0.3

## 2016-10-25 MED ORDER — FLEET ENEMA 7-19 GM/118ML RE ENEM: 1.0000 | ENEMA | Freq: Once | RECTAL | Status: DC | PRN

## 2016-10-25 MED ORDER — CHLORHEXIDINE GLUCONATE 4 % EX LIQD: 60.0000 mL | Freq: Once | CUTANEOUS | Status: DC

## 2016-10-25 MED ORDER — CEFAZOLIN SODIUM-DEXTROSE 2-4 GM/100ML-% IV SOLN: 2.0000 g | INTRAVENOUS | Status: DC

## 2016-10-25 MED ORDER — ONDANSETRON HCL 4 MG/2ML IJ SOLN
4.0000 mg | Freq: Four times a day (QID) | INTRAMUSCULAR | Status: DC | PRN
  Administered 2016-10-25 – 2016-10-26 (×2): 4 mg via INTRAVENOUS
  Filled 2016-10-25 (×2): qty 2

## 2016-10-25 MED ORDER — FENTANYL CITRATE (PF) 100 MCG/2ML IJ SOLN
INTRAMUSCULAR | Status: AC
  Administered 2016-10-25: 25 ug via INTRAVENOUS
  Filled 2016-10-25: qty 2

## 2016-10-25 MED ORDER — PANTOPRAZOLE SODIUM 40 MG PO TBEC
40.0000 mg | DELAYED_RELEASE_TABLET | Freq: Two times a day (BID) | ORAL | Status: DC
  Administered 2016-10-25 – 2016-10-27 (×4): 40 mg via ORAL
  Filled 2016-10-25 (×4): qty 1

## 2016-10-25 MED ORDER — CEFAZOLIN SODIUM-DEXTROSE 2-3 GM-% IV SOLR
INTRAVENOUS | Status: DC | PRN
  Administered 2016-10-25: 2 g via INTRAVENOUS

## 2016-10-25 MED ORDER — TRANEXAMIC ACID 1000 MG/10ML IV SOLN
1000.0000 mg | Freq: Once | INTRAVENOUS | Status: AC
  Administered 2016-10-25: 1000 mg via INTRAVENOUS
  Filled 2016-10-25: qty 10

## 2016-10-25 MED ORDER — ACETAMINOPHEN 10 MG/ML IV SOLN
INTRAVENOUS | Status: DC | PRN
  Administered 2016-10-25: 1000 mg via INTRAVENOUS

## 2016-10-25 MED ORDER — MIDAZOLAM HCL 5 MG/5ML IJ SOLN
INTRAMUSCULAR | Status: DC | PRN
  Administered 2016-10-25: 2 mg via INTRAVENOUS
  Administered 2016-10-25 (×2): 1 mg via INTRAVENOUS

## 2016-10-25 MED ORDER — TETRACAINE HCL 1 % IJ SOLN
INTRAMUSCULAR | Status: AC
  Filled 2016-10-25: qty 2

## 2016-10-25 MED ORDER — LACTATED RINGERS IV SOLN
INTRAVENOUS | Status: DC
  Administered 2016-10-25: 11:00:00 via INTRAVENOUS

## 2016-10-25 MED ORDER — TRAMADOL HCL 50 MG PO TABS
50.0000 mg | ORAL_TABLET | ORAL | Status: DC | PRN
  Administered 2016-10-25 – 2016-10-27 (×3): 100 mg via ORAL
  Filled 2016-10-25 (×3): qty 2

## 2016-10-25 MED ORDER — MAGNESIUM HYDROXIDE 400 MG/5ML PO SUSP
30.0000 mL | Freq: Every day | ORAL | Status: DC | PRN
  Administered 2016-10-26: 30 mL via ORAL
  Filled 2016-10-25: qty 30

## 2016-10-25 MED ORDER — ACETAMINOPHEN 10 MG/ML IV SOLN
1000.0000 mg | Freq: Four times a day (QID) | INTRAVENOUS | Status: AC
  Administered 2016-10-25 – 2016-10-26 (×4): 1000 mg via INTRAVENOUS
  Filled 2016-10-25 (×4): qty 100

## 2016-10-25 MED ORDER — FENTANYL CITRATE (PF) 100 MCG/2ML IJ SOLN
25.0000 ug | INTRAMUSCULAR | Status: DC | PRN
  Administered 2016-10-25 (×2): 25 ug via INTRAVENOUS

## 2016-10-25 MED ORDER — FERROUS SULFATE 325 (65 FE) MG PO TABS
325.0000 mg | ORAL_TABLET | Freq: Two times a day (BID) | ORAL | Status: DC
  Administered 2016-10-25 – 2016-10-27 (×4): 325 mg via ORAL
  Filled 2016-10-25 (×4): qty 1

## 2016-10-25 MED ORDER — CEFAZOLIN SODIUM-DEXTROSE 2-4 GM/100ML-% IV SOLN
2.0000 g | Freq: Four times a day (QID) | INTRAVENOUS | Status: AC
  Administered 2016-10-25 – 2016-10-26 (×3): 2 g via INTRAVENOUS
  Filled 2016-10-25 (×4): qty 100

## 2016-10-25 MED ORDER — ACETAMINOPHEN 325 MG PO TABS: 650.0000 mg | ORAL_TABLET | Freq: Four times a day (QID) | ORAL | Status: DC | PRN

## 2016-10-25 MED ORDER — PROPOFOL 10 MG/ML IV BOLUS
INTRAVENOUS | Status: DC | PRN
  Administered 2016-10-25 (×2): 20 mg via INTRAVENOUS

## 2016-10-25 MED ORDER — CEFAZOLIN SODIUM-DEXTROSE 2-4 GM/100ML-% IV SOLN
INTRAVENOUS | Status: AC
  Filled 2016-10-25: qty 100

## 2016-10-25 MED ORDER — OXYCODONE HCL 5 MG PO TABS
5.0000 mg | ORAL_TABLET | ORAL | Status: DC | PRN
  Administered 2016-10-25 – 2016-10-27 (×6): 10 mg via ORAL
  Filled 2016-10-25 (×6): qty 2

## 2016-10-25 MED ORDER — BUPIVACAINE HCL (PF) 0.25 % IJ SOLN
INTRAMUSCULAR | Status: DC | PRN
  Administered 2016-10-25: 60 mL

## 2016-10-25 MED ORDER — LEVOTHYROXINE SODIUM 112 MCG PO TABS
112.0000 ug | ORAL_TABLET | Freq: Every day | ORAL | Status: DC
  Administered 2016-10-26 – 2016-10-27 (×2): 112 ug via ORAL
  Filled 2016-10-25 (×2): qty 1

## 2016-10-25 MED ORDER — VITAMIN D (ERGOCALCIFEROL) 1.25 MG (50000 UNIT) PO CAPS
50000.0000 [IU] | ORAL_CAPSULE | ORAL | Status: DC
  Administered 2016-10-27: 50000 [IU] via ORAL
  Filled 2016-10-25: qty 1

## 2016-10-25 MED ORDER — BISACODYL 10 MG RE SUPP: 10.0000 mg | Freq: Every day | RECTAL | Status: DC | PRN

## 2016-10-25 MED ORDER — METOCLOPRAMIDE HCL 10 MG PO TABS
10.0000 mg | ORAL_TABLET | Freq: Three times a day (TID) | ORAL | Status: DC
  Administered 2016-10-25 – 2016-10-27 (×7): 10 mg via ORAL
  Filled 2016-10-25 (×7): qty 1

## 2016-10-25 MED ORDER — PROPOFOL 500 MG/50ML IV EMUL
INTRAVENOUS | Status: DC | PRN
  Administered 2016-10-25: 75 ug/kg/min via INTRAVENOUS

## 2016-10-25 MED ORDER — ALBUTEROL SULFATE HFA 108 (90 BASE) MCG/ACT IN AERS: 2.0000 | INHALATION_SPRAY | Freq: Four times a day (QID) | RESPIRATORY_TRACT | Status: DC | PRN

## 2016-10-25 MED ORDER — DIPHENHYDRAMINE HCL 12.5 MG/5ML PO ELIX: 12.5000 mg | ORAL_SOLUTION | ORAL | Status: DC | PRN

## 2016-10-25 MED ORDER — TETRACAINE HCL 1 % IJ SOLN
INTRAMUSCULAR | Status: DC | PRN
  Administered 2016-10-25: 10 mg via INTRASPINAL

## 2016-10-25 MED ORDER — MORPHINE SULFATE (PF) 4 MG/ML IV SOLN: 2.0000 mg | INTRAVENOUS | Status: DC | PRN

## 2016-10-25 SURGICAL SUPPLY — 61 items
AUTOTRANSFUS HAS 1/8 (MISCELLANEOUS) ×2
BATTERY INSTRU NAVIGATION (MISCELLANEOUS) ×8 IMPLANT
BLADE SAW 1 (BLADE) IMPLANT
BLADE SAW 1/2 (BLADE) ×2 IMPLANT
BLADE SAW SAG 25X90X1.19 (BLADE) ×2 IMPLANT
BTRY SRG DRVR LF (MISCELLANEOUS) ×4
CANISTER SUCT 1200ML W/VALVE (MISCELLANEOUS) ×2 IMPLANT
CANISTER SUCT 3000ML (MISCELLANEOUS) ×4 IMPLANT
CAP KNEE TOTAL 3 SIGMA ×2 IMPLANT
CATH TRAY METER 16FR LF (MISCELLANEOUS) ×2 IMPLANT
CEMENT HV SMART SET (Cement) ×4 IMPLANT
COOLER POLAR GLACIER W/PUMP (MISCELLANEOUS) ×2 IMPLANT
CUFF TOURN 24 STER (MISCELLANEOUS) IMPLANT
CUFF TOURN 30 STER DUAL PORT (MISCELLANEOUS) ×2 IMPLANT
DRAPE SHEET LG 3/4 BI-LAMINATE (DRAPES) ×2 IMPLANT
DRSG DERMACEA 8X12 NADH (GAUZE/BANDAGES/DRESSINGS) ×2 IMPLANT
DRSG OPSITE POSTOP 4X14 (GAUZE/BANDAGES/DRESSINGS) ×2 IMPLANT
DRSG TEGADERM 4X4.75 (GAUZE/BANDAGES/DRESSINGS) ×2 IMPLANT
DURAPREP 26ML APPLICATOR (WOUND CARE) ×4 IMPLANT
ELECT CAUTERY BLADE 6.4 (BLADE) ×2 IMPLANT
ELECT REM PT RETURN 9FT ADLT (ELECTROSURGICAL) ×2
ELECTRODE REM PT RTRN 9FT ADLT (ELECTROSURGICAL) ×1 IMPLANT
EX-PIN ORTHOLOCK NAV 4X150 (PIN) ×4 IMPLANT
GLOVE BIOGEL M STRL SZ7.5 (GLOVE) ×4 IMPLANT
GLOVE INDICATOR 8.0 STRL GRN (GLOVE) ×2 IMPLANT
GLOVE SURG 9.0 ORTHO LTXF (GLOVE) ×2 IMPLANT
GLOVE SURG ORTHO 9.0 STRL STRW (GLOVE) ×2 IMPLANT
GOWN STRL REUS W/ TWL LRG LVL3 (GOWN DISPOSABLE) ×2 IMPLANT
GOWN STRL REUS W/TWL 2XL LVL3 (GOWN DISPOSABLE) ×2 IMPLANT
GOWN STRL REUS W/TWL LRG LVL3 (GOWN DISPOSABLE) ×4
HANDPIECE INTERPULSE COAX TIP (DISPOSABLE) ×2
HOLDER FOLEY CATH W/STRAP (MISCELLANEOUS) ×2 IMPLANT
HOOD PEEL AWAY FLYTE STAYCOOL (MISCELLANEOUS) ×4 IMPLANT
KIT RM TURNOVER STRD PROC AR (KITS) ×2 IMPLANT
KNIFE SCULPS 14X20 (INSTRUMENTS) ×2 IMPLANT
LABEL OR SOLS (LABEL) ×2 IMPLANT
NDL SAFETY 18GX1.5 (NEEDLE) ×2 IMPLANT
NEEDLE SPNL 20GX3.5 QUINCKE YW (NEEDLE) ×2 IMPLANT
NS IRRIG 500ML POUR BTL (IV SOLUTION) ×2 IMPLANT
PACK TOTAL KNEE (MISCELLANEOUS) ×2 IMPLANT
PAD WRAPON POLAR KNEE (MISCELLANEOUS) ×1 IMPLANT
PIN FIXATION 1/8DIA X 3INL (PIN) ×2 IMPLANT
SET HNDPC FAN SPRY TIP SCT (DISPOSABLE) ×1 IMPLANT
SOL .9 NS 3000ML IRR  AL (IV SOLUTION) ×1
SOL .9 NS 3000ML IRR AL (IV SOLUTION) ×1
SOL .9 NS 3000ML IRR UROMATIC (IV SOLUTION) ×1 IMPLANT
SOL PREP PVP 2OZ (MISCELLANEOUS) ×2
SOLUTION PREP PVP 2OZ (MISCELLANEOUS) ×1 IMPLANT
SPONGE DRAIN TRACH 4X4 STRL 2S (GAUZE/BANDAGES/DRESSINGS) ×2 IMPLANT
STAPLER SKIN PROX 35W (STAPLE) ×2 IMPLANT
SUCTION FRAZIER HANDLE 10FR (MISCELLANEOUS) ×1
SUCTION TUBE FRAZIER 10FR DISP (MISCELLANEOUS) ×1 IMPLANT
SUT VIC AB 0 CT1 36 (SUTURE) ×2 IMPLANT
SUT VIC AB 1 CT1 36 (SUTURE) ×4 IMPLANT
SUT VIC AB 2-0 CT2 27 (SUTURE) ×2 IMPLANT
SYR 20CC LL (SYRINGE) ×2 IMPLANT
SYR 30ML LL (SYRINGE) ×4 IMPLANT
SYSTEM AUTOTRANSFUS DUAL TROCR (MISCELLANEOUS) ×1 IMPLANT
TOWEL OR 17X26 4PK STRL BLUE (TOWEL DISPOSABLE) ×2 IMPLANT
TOWER CARTRIDGE SMART MIX (DISPOSABLE) ×2 IMPLANT
WRAPON POLAR PAD KNEE (MISCELLANEOUS) ×2

## 2016-10-25 NOTE — H&P (Signed)
The patient has been re-examined, and the chart reviewed, and there have been no interval changes to the documented history and physical.    The risks, benefits, and alternatives have been discussed at length. The patient expressed understanding of the risks benefits and agreed with plans for surgical intervention.  James P. Hooten, Jr. M.D.    

## 2016-10-25 NOTE — Progress Notes (Addendum)
Pt arrived from pacu at 1635. Pt alert and oriented on arrival, was unable to feel her legs and toes being touched, could not move her toes or feet, but reported and slight burning feeling to the top of her thigh on the R side. Pt is on room air, lungs are clear bilat. Murmur auscultated. Abdomen is soft, bs heard. PIV #20 intact to L fa with iv ns hung at 16600mls/hr, piv #20 intact to r arm with both sites free of redness and swelling. Foley draining yellow urine, ppp, cap refill to both feet is wnl. R leg has bulky dressing with polar care intact, and this leg is placed in bone foam. In addition, pt has an autovac draining small amount of bloody fluid. This Clinical research associatewriter reviewed pain medication available to pt, pt received tramadol po with evening meds, and pt also received reglan and zofran to avoid nausea. Pt has had feeling return to her feet by this time and is able to flex her feet and move her toes. Husband at bedside. Call bell in reach.pt also has ted on L leg and foot pumps in place bilat.

## 2016-10-25 NOTE — Transfer of Care (Signed)
Immediate Anesthesia Transfer of Care Note  Patient: Jane [name]  Procedure(s) Performed: Procedure(s): COMPUTER ASSISTED TOTAL KNEE ARTHROPLASTY (Right)  Patient Location: PACU  Anesthesia Type:Spinal  Level of Consciousness: sedated  Airway & Oxygen Therapy: Patient Spontanous Breathing and Patient connected to nasal cannula oxygen  Post-op Assessment: Report given to RN and Post -op Vital signs reviewed and stable  Post vital signs: Reviewed and stable  Last Vitals:  Vitals:   10/25/16 0950 10/25/16 1538  BP: 130/62 117/62  Pulse: 73 70  Resp: 16 16  Temp: 36.7 C 36.5 C    Last Pain:  Vitals:   10/25/16 0950  TempSrc: Oral         Complications: No apparent anesthesia complications

## 2016-10-25 NOTE — Op Note (Signed)
OPERATIVE NOTE  DATE OF SURGERY:  10/25/2016  PATIENT NAME:  Jane [name]   DOB: 1961-07-21  MRN: [id number]  PRE-OPERATIVE DIAGNOSIS: Degenerative arthrosis of the right knee, primary  POST-OPERATIVE DIAGNOSIS:  Same  PROCEDURE:  Right total knee arthroplasty using computer-assisted navigation  SURGEON:  Jane [name], Jr. M.D.  ASSISTANT:  Van ClinesJon Wolfe, PA (present and scrubbed throughout the case, critical for assistance with exposure, retraction, instrumentation, and closure)  ANESTHESIA: spinal  ESTIMATED BLOOD LOSS: 50 mL  FLUIDS REPLACED: 1800 mL of crystalloid  TOURNIQUET TIME: 91 minutes  DRAINS: 2 medium drains to a reinfusion system  SOFT TISSUE RELEASES: Anterior cruciate ligament, posterior cruciate ligament, deep medial collateral ligament, patellofemoral ligament  IMPLANTS UTILIZED: DePuy PFC Sigma size 3 posterior stabilized femoral component (cemented), size 3 MBT tibial component (cemented), 35 mm 3 peg oval dome patella (cemented), and a 10 mm stabilized rotating platform polyethylene insert.  INDICATIONS FOR SURGERY: Jane JewsVickie H Wiggs is a 55 y.o. year old female with a long history of progressive knee pain. X-rays demonstrated severe degenerative changes in tricompartmental fashion. The patient had not seen any significant improvement despite conservative nonsurgical intervention. After discussion of the risks and benefits of surgical intervention, the patient expressed understanding of the risks benefits and agree with plans for total knee arthroplasty.   The risks, benefits, and alternatives were discussed at length including but not limited to the risks of infection, bleeding, nerve injury, stiffness, blood clots, the need for revision surgery, cardiopulmonary complications, among others, and they were willing to proceed.  PROCEDURE IN DETAIL: The patient was brought into the operating room and, after adequate spinal anesthesia was achieved, a tourniquet was  placed on the patient's upper thigh. The patient's knee and leg were cleaned and prepped with alcohol and DuraPrep and draped in the usual sterile fashion. A "timeout" was performed as per usual protocol. The lower extremity was exsanguinated using an Esmarch, and the tourniquet was inflated to 300 mmHg. An anterior longitudinal incision was made followed by a standard mid vastus approach. The deep fibers of the medial collateral ligament were elevated in a subperiosteal fashion off of the medial flare of the tibia so as to maintain a continuous soft tissue sleeve. The patella was subluxed laterally and the patellofemoral ligament was incised. Inspection of the knee demonstrated severe degenerative changes with full-thickness loss of articular cartilage. Osteophytes were debrided using a rongeur. Anterior and posterior cruciate ligaments were excised. Two 4.0 mm Schanz pins were inserted in the femur and into the tibia for attachment of the array of trackers used for computer-assisted navigation. Hip center was identified using a circumduction technique. Distal landmarks were mapped using the computer. The distal femur and proximal tibia were mapped using the computer. The distal femoral cutting guide was positioned using computer-assisted navigation so as to achieve a 5 distal valgus cut. The femur was sized and it was felt that a size 3 femoral component was appropriate. A size 3 femoral cutting guide was positioned and the anterior cut was performed and verified using the computer. This was followed by completion of the posterior and chamfer cuts. Femoral cutting guide for the central box was then positioned in the center box cut was performed.  Attention was then directed to the proximal tibia. Medial and lateral menisci were excised. The extramedullary tibial cutting guide was positioned using computer-assisted navigation so as to achieve a 0 varus-valgus alignment and 0 posterior slope. The cut was  performed and verified using  the computer. The proximal tibia was sized and it was felt that a size 3 tibial tray was appropriate. Tibial and femoral trials were inserted followed by insertion of a 10 mm polyethylene insert. This allowed for excellent mediolateral soft tissue balancing both in flexion and in full extension. Finally, the patella was cut and prepared so as to accommodate a 35 mm 3 peg oval dome patella. A patella trial was placed and the knee was placed through a range of motion with excellent patellar tracking appreciated. The femoral trial was removed after debridement of posterior osteophytes. The central post-hole for the tibial component was reamed followed by insertion of a keel punch. Tibial trials were then removed. Cut surfaces of bone were irrigated with copious amounts of normal saline with antibiotic solution using pulsatile lavage and then suctioned dry. Polymethylmethacrylate cement was prepared in the usual fashion using a vacuum mixer. Cement was applied to the cut surface of the proximal tibia as well as along the undersurface of a size 3 MBT tibial component. Tibial component was positioned and impacted into place. Excess cement was removed using Personal assistantreer elevators. Cement was then applied to the cut surfaces of the femur as well as along the posterior flanges of the size 3 femoral component. The femoral component was positioned and impacted into place. Excess cement was removed using Personal assistantreer elevators. A 10 mm polyethylene trial was inserted and the knee was brought into full extension with steady axial compression applied. Finally, cement was applied to the backside of a 35 mm 3 peg oval dome patella and the patellar component was positioned and patellar clamp applied. Excess cement was removed using Personal assistantreer elevators. After adequate curing of the cement, the tourniquet was deflated after a total tourniquet time of 91 minutes. Hemostasis was achieved using electrocautery. The knee was  irrigated with copious amounts of normal saline with antibiotic solution using pulsatile lavage and then suctioned dry. 20 mL of 1.3% Exparel and 60 mL of 0.25% Marcaine in 40 mL of normal saline was injected along the posterior capsule, medial and lateral gutters, and along the arthrotomy site. A 10 mm stabilized rotating platform polyethylene insert was inserted and the knee was placed through a range of motion with excellent mediolateral soft tissue balancing appreciated and excellent patellar tracking noted. 2 medium drains were placed in the wound bed and brought out through separate stab incisions to be attached to a reinfusion system. The medial parapatellar portion of the incision was reapproximated using interrupted sutures of #1 Vicryl. Subcutaneous tissue was approximated in layers using first #0 Vicryl followed #2-0 Vicryl. The skin was approximated with skin staples. A sterile dressing was applied.  The patient tolerated the procedure well and was transported to the recovery room in stable condition.    Marnita Poirier P. Angie FavaHooten, Jr., M.D.

## 2016-10-25 NOTE — Anesthesia Procedure Notes (Signed)
Spinal  Patient location during procedure: OR Start time: 10/25/2016 12:14 PM End time: 10/25/2016 12:16 PM Staffing Anesthesiologist: Martha Clan Resident/CRNA: Hedda Slade Performed: resident/CRNA  Preanesthetic Checklist Completed: patient identified, site marked, surgical consent, pre-op evaluation, timeout performed, IV checked, risks and benefits discussed and monitors and equipment checked Spinal Block Patient position: sitting Prep: ChloraPrep Patient monitoring: heart rate, continuous pulse ox, blood pressure and cardiac monitor Approach: midline Location: L3-4 Injection technique: single-shot Needle Needle type: Whitacre and Introducer  Needle gauge: 24 G Needle length: 9 cm Additional Notes Negative paresthesia. Negative blood return. Positive free-flowing CSF. Expiration date of kit checked and confirmed. Patient tolerated procedure well, without complications.

## 2016-10-25 NOTE — Brief Op Note (Signed)
10/25/2016  3:45 PM  PATIENT:  Jane [name]  55 y.o. female  PRE-OPERATIVE DIAGNOSIS:  osteoarthritis of the right knee  POST-OPERATIVE DIAGNOSIS: Same  PROCEDURE:  Procedure(s): COMPUTER ASSISTED TOTAL KNEE ARTHROPLASTY (Right)  SURGEON:  Surgeon(s) and Role:    * Donato HeinzJames P Hooten, MD - Primary  ASSISTANTS: Van ClinesJon Wolfe, PA   ANESTHESIA:   spinal  EBL:  Total I/O In: 1800 [I.V.:1800] Out: 600 [Urine:550; Blood:50]  BLOOD ADMINISTERED:none  DRAINS: 2 medium drains to a reinfusion system   LOCAL MEDICATIONS USED:  MARCAINE    and OTHER Exparel  SPECIMEN:  No Specimen  DISPOSITION OF SPECIMEN:  N/A  COUNTS:  YES  TOURNIQUET:  91 minutes  DICTATION: .Dragon Dictation  PLAN OF CARE: Admit to inpatient   PATIENT DISPOSITION:  PACU - hemodynamically stable.   Delay start of Pharmacological VTE agent (>24hrs) due to surgical blood loss or risk of bleeding: yes

## 2016-10-25 NOTE — Anesthesia Preprocedure Evaluation (Signed)
Anesthesia Evaluation  Patient identified by MRN, date of birth, ID band Patient awake    Reviewed: Allergy & Precautions, H&P , NPO status , Patient's Chart, lab work & pertinent test results, reviewed documented beta blocker date and time   History of Anesthesia Complications (+) PONV and history of anesthetic complications  Airway Mallampati: I  TM Distance: >3 FB Neck ROM: full    Dental  (+) Implants   Pulmonary neg shortness of breath, asthma , neg sleep apnea, neg COPD, neg recent URI, former smoker,    Pulmonary exam normal breath sounds clear to auscultation       Cardiovascular Exercise Tolerance: Good (-) hypertension(-) angina(-) CAD, (-) Past MI, (-) Cardiac Stents and (-) CABG Normal cardiovascular exam(-) dysrhythmias + Valvular Problems/Murmurs MVP and MR  Rhythm:regular Rate:Normal     Neuro/Psych PSYCHIATRIC DISORDERS (fibromyalgia) negative neurological ROS     GI/Hepatic Neg liver ROS, GERD  ,  Endo/Other  neg diabetesHypothyroidism   Renal/GU negative Renal ROS  negative genitourinary   Musculoskeletal   Abdominal   Peds  Hematology negative hematology ROS (+)   Anesthesia Other Findings Past Medical History: No date: Arthritis     Comment: Knees, feet, elbows , neck No date: Asthma     Comment: mild; assoc with seasonal allergies; uses               Albuterol once per year on average. No date: Atypical chest pain     Comment: hx  No date: Complication of anesthesia     Comment: nausea, last 2 surgeries nausea much less 12/04/2010: Fibromyalgia     [facility] [place] Dr.               Gwenevere GhaziNicole Keltt; followed every six months. No date: GERD (gastroesophageal reflux disease) No date: Heart murmur     Comment: Mitral valve Prolapse No date: HLD (hyperlipidemia) post radioactive iodine: Hypothyroidism     Comment: Hyperthyroidism; now secondary hypothyroidism No  date: MR (mitral regurgitation)     Comment: moderate; probably MVP. [facility]               [facility]iology with normal EF, moderate MR    Reproductive/Obstetrics negative OB ROS                             Anesthesia Physical Anesthesia Plan  ASA: II  Anesthesia Plan: Spinal   Post-op Pain Management:    Induction:   Airway Management Planned:   Additional Equipment:   Intra-op Plan:   Post-operative Plan:   Informed Consent: I have reviewed the patients History and Physical, chart, labs and discussed the procedure including the risks, benefits and alternatives for the proposed anesthesia with the patient or authorized representative who has indicated his/her understanding and acceptance.   Dental Advisory Given  Plan Discussed with: Anesthesiologist, CRNA and Surgeon  Anesthesia Plan Comments:         Anesthesia Quick Evaluation

## 2016-10-26 LAB — CBC
HCT: 31.2 % — ABNORMAL LOW (ref 35.0–47.0)
Hemoglobin: 10.8 g/dL — ABNORMAL LOW (ref 12.0–16.0)
MCH: 30.2 pg (ref 26.0–34.0)
MCHC: 34.7 g/dL (ref 32.0–36.0)
MCV: 87.1 fL (ref 80.0–100.0)
PLATELETS: 186 10*3/uL (ref 150–440)
RBC: 3.58 MIL/uL — ABNORMAL LOW (ref 3.80–5.20)
RDW: 13.1 % (ref 11.5–14.5)
WBC: 9.8 10*3/uL (ref 3.6–11.0)

## 2016-10-26 LAB — BASIC METABOLIC PANEL
Anion gap: 5 (ref 5–15)
BUN: 15 mg/dL (ref 6–20)
CALCIUM: 8.1 mg/dL — AB (ref 8.9–10.3)
CO2: 29 mmol/L (ref 22–32)
Chloride: 104 mmol/L (ref 101–111)
Creatinine, Ser: 0.72 mg/dL (ref 0.44–1.00)
GFR calc Af Amer: >60 mL/min (ref >60)
GLUCOSE: 130 mg/dL — AB (ref 65–99)
Potassium: 3.8 mmol/L (ref 3.5–5.1)
Sodium: 138 mmol/L (ref 135–145)

## 2016-10-26 NOTE — Progress Notes (Signed)
Pt received tramadol for pain and MOM for constipation at this time. Pt stated she still has significant pain with movement of her R knee.

## 2016-10-26 NOTE — Progress Notes (Signed)
Shift assessment completed at 0730. Pt in no distress at that time, rating pain at 5/10 if lying still. Pt is on room air, lungs are clear bilat, HR is regular. Abdomen is soft, bs heard. PIV#20 intact to l upper arm with iv ns infusing at 12100mls/hr, site is free of redness and swelling, PIV intact to R arm as well, site is asymptomatic. Pt's R knee has dressing dry and intact, polar care in place and hemovac intact with scant bloody drainage noted. Pt has foot pumps in place. Since assessment, pt has been oob to recliner with physical therapy and returned to bed, received pain med at 1330.

## 2016-10-26 NOTE — Evaluation (Signed)
Physical Therapy Evaluation Patient Details Name: Jane [name] MRN: [id number] DOB: Aug 30, 1961 Today's Date: 10/26/2016   History of Present Illness  Pt underwent R TKR without reported post-op complications. PT evaluation performed on POD#1  Clinical Impression  Pt admitted with above diagnosis. Pt currently with functional limitations due to the deficits listed below (see PT Problem List).  Patient demonstrates good strength and mobility for first attempt with physical therapy during initial evaluation. She requires only contact-guard assist for bed mobility transfers and ambulation. Patient is able to ambulate from bed to door and back to her recliner. Initially she demonstrates decreased weight shift to the right lower extremity however improves with increased distance. Patient able to complete all exercises as instructed and demonstrates good right hip flexion and right knee extension strength. She will be appropriate to return home at discharge with husband and home health physical therapy. Pt will benefit from skilled PT services to address deficits in strength, balance, and mobility in order to return to full function at home.     Follow Up Recommendations Home health PT    Equipment Recommendations  None recommended by PT    Recommendations for Other Services       Precautions / Restrictions Precautions Precautions: Knee;Fall Precaution Booklet Issued: Yes (comment) Required Braces or Orthoses: Knee Immobilizer - Right Knee Immobilizer - Right: Discontinue once straight leg raise with < 10 degree lag Restrictions Weight Bearing Restrictions: Yes RLE Weight Bearing: Weight bearing as tolerated      Mobility  Bed Mobility Overal bed mobility: Needs Assistance Bed Mobility: Supine to Sit     Supine to sit: Min guard     General bed mobility comments: Patient requires cues for proper sequencing with bed mobility and use of bed rails. She moves slowly but is able to  come to sitting without assistance from therapist  Transfers Overall transfer level: Needs assistance Equipment used: Rolling walker (2 wheeled) Transfers: Sit to/from Stand Sit to Stand: Min guard         General transfer comment: Patient is able to come to standing without assistance from therapist but with increased time. Decreased weight shift to right lower extremity exhibited during transfers. Cues for safe hand placement proper sequencing.  Ambulation/Gait Ambulation/Gait assistance: Min guard Ambulation Distance (Feet): 30 Feet Assistive device: Rolling walker (2 wheeled) Gait Pattern/deviations: Decreased step length - left;Decreased weight shift to right;Antalgic Gait velocity: Decreased Gait velocity interpretation: <1.8 ft/sec, indicative of risk for recurrent falls General Gait Details: Patient able to ambulate from bed to door and back to recliner. She demonstrates decreased step length but improves with distance. Patient with heavy upper extremity use during ambulation. Reports mild increase in pain. Cues provided for proper sequencing with walker.  Stairs            Wheelchair Mobility    Modified Rankin (Stroke Patients Only)       Balance Overall balance assessment: Needs assistance Sitting-balance support: No upper extremity supported Sitting balance-Leahy Scale: Good     Standing balance support: Bilateral upper extremity supported Standing balance-Leahy Scale: Fair Standing balance comment: Able to maintain standing balance without upper extremity support, but with minimal weight shift to right lower extremity                             Pertinent Vitals/Pain Pain Assessment: 0-10 Pain Score: 5  Pain Location: Right knee Pain Descriptors / Indicators: Aching Pain Intervention(s): Monitored  during session;Premedicated before session;Limited activity within patient's tolerance    Home Living Family/patient expects to be discharged  to:: Private residence Living Arrangements: Spouse/significant other Available Help at Discharge: Family Type of Home: House Home Access: Stairs to enter Entrance Stairs-Rails: Left Entrance Stairs-Number of Steps: 4 Home Layout: Laundry or work area in basement;Able to live on main level with bedroom/bathroom Home Equipment: Bedside commode;Walker - 2 wheels;Cane - single point;Grab bars - tub/shower      Prior Function Level of Independence: Independent         Comments: Fully independent with ADLs/IADLs. No falls     Hand Dominance   Dominant Hand: Right    Extremity/Trunk Assessment   Upper Extremity Assessment: Overall WFL for tasks assessed           Lower Extremity Assessment: RLE deficits/detail RLE Deficits / Details: Pt able to perform SLR and SAQ without assistance but with increased pain. Reports intact sensation to light touch in RLE. Full DF/PF strength       Communication   Communication: No difficulties  Cognition Arousal/Alertness: Awake/alert Behavior During Therapy: WFL for tasks assessed/performed Overall Cognitive Status: Within Functional Limits for tasks assessed                      General Comments      Exercises Total Joint Exercises Ankle Circles/Pumps: Strengthening;Both;10 reps;Supine Quad Sets: Strengthening;Both;10 reps;Supine Gluteal Sets: Strengthening;Both;10 reps;Supine Towel Squeeze: Strengthening;Both;10 reps;Supine Short Arc Quad: Strengthening;10 reps;Supine;Right Heel Slides: Strengthening;10 reps;Supine;Right Hip ABduction/ADduction: Strengthening;Right;10 reps;Supine Straight Leg Raises: Right;Strengthening;10 reps;Supine Goniometric ROM: -8 to 60 degrees AAROM, pain limited   Assessment/Plan    PT Assessment Patient needs continued PT services  PT Problem List Decreased strength;Decreased range of motion;Decreased balance;Decreased mobility;Pain          PT Treatment Interventions DME  instruction;Gait training;Stair training;Therapeutic activities;Therapeutic exercise;Balance training;Neuromuscular re-education;Patient/family education;Manual techniques    PT Goals (Current goals can be found in the Care Plan section)  Acute Rehab PT Goals Patient Stated Goal: Return to prior level of function at home PT Goal Formulation: With patient Time For Goal Achievement: 11/09/16 Potential to Achieve Goals: Good    Frequency BID   Barriers to discharge        Co-evaluation               End of Session Equipment Utilized During Treatment: Gait belt Activity Tolerance: Patient tolerated treatment well Patient left: in chair;with call bell/phone within reach;with chair alarm set;with SCD's reapplied;Other (comment) (polar care in place, towel roll under heel) Nurse Communication: Mobility status         Time: [id number] PT Time Calculation (min) (ACUTE ONLY): 30 min   Charges:   PT Evaluation $PT Eval Low Complexity: 1 Procedure PT Treatments $Therapeutic Exercise: 8-22 mins   PT G Codes:       Sharalyn InkJason D Huprich PT, DPT   Huprich,Jason 10/26/2016, 10:16 AM

## 2016-10-26 NOTE — Progress Notes (Signed)
Subjective: 1 Day Post-Op Procedure(s) (LRB): COMPUTER ASSISTED TOTAL KNEE ARTHROPLASTY (Right) Patient reports pain as 7 on 0-10 scale.   Patient is well, and has had no acute complaints or problems Plan is to go Home after hospital stay. Negative for chest pain and shortness of breath Fever: no Gastrointestinal:Positive for mild nausea.  Objective: Vital signs in last 24 hours: Temp:  [97.7 F (36.5 C)-98.7 F (37.1 C)] 98.7 F (37.1 C) (11/23 0725) Pulse Rate:  [50-74] 64 (11/23 0725) Resp:  [12-18] 16 (11/23 0725) BP: (92-136)/(43-97) 113/51 (11/23 0725) SpO2:  [94 %-100 %] 98 % (11/23 0725) Weight:  [93 kg (205 lb)] 93 kg (205 lb) (11/22 0950)  Intake/Output from previous day:  Intake/Output Summary (Last 24 hours) at 10/26/16 0843 Last data filed at 10/26/16 0600  Gross per 24 hour  Intake             3810 ml  Output             1960 ml  Net             1850 ml    Intake/Output this shift: No intake/output data recorded.  Labs:  Recent Labs  10/26/16 0634  HGB 10.8*    Recent Labs  10/26/16 0634  WBC 9.8  RBC 3.58*  HCT 31.2*  PLT 186    Recent Labs  10/26/16 0634  NA 138  K 3.8  CL 104  CO2 29  BUN 15  CREATININE 0.72  GLUCOSE 130*  CALCIUM 8.1*   No results for input(s): LABPT, INR in the last 72 hours.   EXAM General - Patient is Alert, Appropriate and Oriented Extremity - Neurologically intact ABD soft Sensation intact distally Dorsiflexion/Plantar flexion intact Incision: dressing C/D/I Dressing/Incision - clean, dry, no drainage Motor Function - intact, moving foot and toes well on exam.   Abdomen soft on exam with normal BS.  Past Medical History:  Diagnosis Date  . Arthritis    Knees, feet, elbows , neck  . Asthma    mild; assoc with seasonal allergies; uses Albuterol once per year on average.  Marland Kitchen. Atypical chest pain    hx   . Complication of anesthesia    nausea, last 2 surgeries nausea much less  . Fibromyalgia  12/04/2010   [facility] [place]; Dr. Gwenevere GhaziNicole Keltt; followed every six months.  Marland Kitchen. GERD (gastroesophageal reflux disease)   . Heart murmur    Mitral valve Prolapse  . HLD (hyperlipidemia)   . Hypothyroidism post radioactive iodine   Hyperthyroidism; now secondary hypothyroidism  . MR (mitral regurgitation)    moderate; probably MVP. [facility] with normal EF, moderate MR     Assessment/Plan: 1 Day Post-Op Procedure(s) (LRB): COMPUTER ASSISTED TOTAL KNEE ARTHROPLASTY (Right) Active Problems:   S/P total knee arthroplasty  Estimated body mass index is 32.11 kg/m as calculated from the following:   Height as of this encounter: 5\' 7"  (1.702 m).   Weight as of this encounter: 93 kg (205 lb). Advance diet Up with therapy D/C IV fluids when tolerating PO intake.  Labs reviewed, Hg 10.8.  BMP ordered for tomorrow morning. Pt initially had N/T in the right leg, has resolved. Will remove bulky dressing tomorrow morning. Plan will be for discharge home tomorrow, Care management to assist with discharge.  DVT Prophylaxis - Lovenox, Foot Pumps and TED hose Weight-Bearing as tolerated to right leg  J. Horris LatinoLance Lakai Moree, PA-C [facility] 10/26/2016,  8:43 AM

## 2016-10-26 NOTE — Plan of Care (Signed)
Problem: Bowel/Gastric: Goal: Will not experience complications related to bowel motility Pt is progressing toward goals.

## 2016-10-27 ENCOUNTER — Encounter: Payer: Self-pay | Admitting: Orthopedic Surgery

## 2016-10-27 LAB — BASIC METABOLIC PANEL
Anion gap: 6 (ref 5–15)
BUN: 10 mg/dL (ref 6–20)
CALCIUM: 8.5 mg/dL — AB (ref 8.9–10.3)
CO2: 27 mmol/L (ref 22–32)
CREATININE: 0.47 mg/dL (ref 0.44–1.00)
Chloride: 105 mmol/L (ref 101–111)
Glucose, Bld: 118 mg/dL — ABNORMAL HIGH (ref 65–99)
Potassium: 3.8 mmol/L (ref 3.5–5.1)
Sodium: 138 mmol/L (ref 135–145)

## 2016-10-27 LAB — CBC
HCT: 31.7 % — ABNORMAL LOW (ref 35.0–47.0)
Hemoglobin: 11 g/dL — ABNORMAL LOW (ref 12.0–16.0)
MCH: 30.3 pg (ref 26.0–34.0)
MCHC: 34.8 g/dL (ref 32.0–36.0)
MCV: 87.2 fL (ref 80.0–100.0)
PLATELETS: 182 10*3/uL (ref 150–440)
RBC: 3.63 MIL/uL — AB (ref 3.80–5.20)
RDW: 13.1 % (ref 11.5–14.5)
WBC: 10.2 10*3/uL (ref 3.6–11.0)

## 2016-10-27 MED ORDER — ENOXAPARIN SODIUM 40 MG/0.4ML ~~LOC~~ SOLN: 40.0000 mg | SUBCUTANEOUS | 0 refills | Status: DC

## 2016-10-27 MED ORDER — OXYCODONE HCL 5 MG PO TABS: 5.0000 mg | ORAL_TABLET | ORAL | 0 refills | Status: DC | PRN

## 2016-10-27 NOTE — Discharge Summary (Signed)
Physician Discharge Summary  Patient ID: Jane HoseVickie H Campise MRN: [id number] DOB/AGE: August 26, 1961 55 y.o.  Admit date: 10/25/2016 Discharge date: 10/27/2016  Admission Diagnoses:  osteoarthritis Degenerative arthrosis of the right knee  Discharge Diagnoses: Patient Active Problem List   Diagnosis Date Noted  . S/P total knee arthroplasty 10/25/2016  . Vitamin D deficiency 09/23/2016  . Hyperlipidemia 01/01/2016  . Hypothyroidism due to acquired atrophy of thyroid 01/01/2016  . Glucose intolerance (impaired glucose tolerance) 01/01/2016  . Obesity 01/01/2016  . Gastroesophageal reflux disease without esophagitis 01/01/2016  . Asthma, mild intermittent 01/01/2016  . Anxiety state 11/09/2014  . Osteoarthritis, knee 10/20/2012  . Fibromyalgia 10/20/2012  . Hypothyroidism following radioiodine therapy 10/20/2012  . ALLERGIC RHINITIS 02/24/2010  . HYPERLIPIDEMIA 12/31/2009  . MURMUR 12/31/2009  Degenerative arthrosis of the right knee.  Past Medical History:  Diagnosis Date  . Arthritis    Knees, feet, elbows , neck  . Asthma    mild; assoc with seasonal allergies; uses Albuterol once per year on average.  Marland Kitchen. Atypical chest pain    hx   . Complication of anesthesia    nausea, last 2 surgeries nausea much less  . Fibromyalgia 12/04/2010   [facility] in [place]; Dr. Gwenevere GhaziNicole Keltt; followed every six months.  Marland Kitchen. GERD (gastroesophageal reflux disease)   . Heart murmur    Mitral valve Prolapse  . HLD (hyperlipidemia)   . Hypothyroidism post radioactive iodine   Hyperthyroidism; now secondary hypothyroidism  . MR (mitral regurgitation)    moderate; probably MVP. [facility] normal EF, moderate MR    Transfusion: None   Consultants (if any):   Discharged Condition: Improved  Hospital Course: Jane [name] is an 55 y.o. female who was admitted 10/25/2016 with a diagnosis of primary degenerative arthrosis of the right knee and went to the  operating room on 10/25/2016 and underwent the above named procedures.    Surgeries: Procedure(s): COMPUTER ASSISTED TOTAL KNEE ARTHROPLASTY on 10/25/2016 Patient tolerated the surgery well. Taken to PACU where she was stabilized and then transferred to the orthopedic floor.  Started on Lovenox 30mg  q 12 hrs. Foot pumps applied bilaterally at 80 mm. Heels elevated on bed with rolled towels. No evidence of DVT. Negative Homan. Physical therapy started on day #1 for gait training and transfer. OT started day #1 for ADL and assisted devices.  Patient's IV and Foley were removed on POD1.  Hemovac drain was removed on POD2.  Implants: DePuy PFC Sigma size 3 posterior stabilized femoral component (cemented), size 3 MBT tibial component (cemented), 35 mm 3 peg oval dome patella (cemented), and a 10 mm stabilized rotating platform polyethylene insert.  She was given perioperative antibiotics:  Anti-infectives    Start     Dose/Rate Route Frequency Ordered Stop   10/25/16 1800  ceFAZolin (ANCEF) IVPB 2g/100 mL premix     2 g 200 mL/hr over 30 Minutes Intravenous Every 6 hours 10/25/16 1645 10/26/16 1759   10/25/16 0855  ceFAZolin (ANCEF) 2-4 GM/100ML-% IVPB    Comments:  JOYCE, HEATHER: cabinet override      10/25/16 0855 10/25/16 2059   10/25/16 0531  ceFAZolin (ANCEF) IVPB 2g/100 mL premix  Status:  Discontinued     2 g 200 mL/hr over 30 Minutes Intravenous On call to O.R. 10/25/16 0531 10/25/16 1142    .  She was given sequential compression devices, early ambulation, and Lovenox for DVT prophylaxis.  She benefited maximally from the hospital stay and  there were no complications.    Recent vital signs:  Vitals:   10/26/16 2015 10/27/16 0407  BP: (!) 147/56 (!) 121/45  Pulse: 88 70  Resp: 18 18  Temp: 98.2 F (36.8 C) 98.2 F (36.8 C)    Recent laboratory studies:  Lab Results  Component Value Date   HGB 11.0 (L) 10/27/2016   HGB 10.8 (L) 10/26/2016   HGB 14.4 10/12/2016    Lab Results  Component Value Date   WBC 10.2 10/27/2016   PLT 182 10/27/2016   Lab Results  Component Value Date   INR 0.98 10/12/2016   Lab Results  Component Value Date   NA 138 10/27/2016   K 3.8 10/27/2016   CL 105 10/27/2016   CO2 27 10/27/2016   BUN 10 10/27/2016   CREATININE 0.47 10/27/2016   GLUCOSE 118 (H) 10/27/2016    Discharge Medications:     Medication List    TAKE these medications   albuterol 108 (90 Base) MCG/ACT inhaler Commonly known as:  PROVENTIL HFA;VENTOLIN HFA Inhale 2 puffs into the lungs every 6 (six) hours as needed for wheezing or shortness of breath (cough, shortness of breath or wheezing.).   enoxaparin 40 MG/0.4ML injection Commonly known as:  LOVENOX Inject 0.4 mLs (40 mg total) into the skin daily.   levothyroxine 112 MCG tablet Commonly known as:  SYNTHROID, LEVOTHROID TAKE 1 TABLET EVERY DAY 30 MINUTES BEFORE BREAKFAST   meloxicam 15 MG tablet Commonly known as:  MOBIC Take 15 mg by mouth daily.   omeprazole 20 MG capsule Commonly known as:  PRILOSEC Take 1 capsule (20 mg total) by mouth daily.   oxyCODONE 5 MG immediate release tablet Commonly known as:  Oxy IR/ROXICODONE Take 1-2 tablets (5-10 mg total) by mouth every 4 (four) hours as needed for severe pain or breakthrough pain.   pantoprazole 40 MG tablet Commonly known as:  PROTONIX Take 40 mg by mouth daily.   ranitidine 150 MG tablet Commonly known as:  ZANTAC Take 1 tablet (150 mg total) by mouth 2 (two) times daily.   traMADol 50 MG tablet Commonly known as:  ULTRAM Take 50 mg by mouth every 6 (six) hours as needed for moderate pain.   Vitamin D (Ergocalciferol) 50000 units Caps capsule Commonly known as:  DRISDOL Take 1 capsule (50,000 Units total) by mouth every 7 (seven) days. What changed:  additional instructions            Durable Medical Equipment        Start     Ordered   10/25/16 1646  DME Walker rolling  Once    Question:  Patient  needs a walker to treat with the following condition  Answer:  Total knee replacement status   10/25/16 1645   10/25/16 1646  DME Bedside commode  Once    Question:  Patient needs a bedside commode to treat with the following condition  Answer:  Total knee replacement status   10/25/16 1645      Diagnostic Studies: Dg Knee Right Port  Result Date: 10/25/2016 CLINICAL DATA:  Status post right knee replacement today. EXAM: PORTABLE RIGHT KNEE - 1-2 VIEW COMPARISON:  MRI right knee 12/21/2008. FINDINGS: New right total knee arthroplasty is in place. No fracture is identified. Hardware is appropriately positioned. Surgical drain and staples are noted. IMPRESSION: Right total knee replacement.  No acute abnormality. Electronically Signed   By: Drusilla Kannerhomas  Dalessio M.D.   On: 10/25/2016 16:00   Disposition:  Plan will be for possible discharge home today pending PT and having a bowel movement.  Follow-up with [facility] in 10-14 days for staple removal.  Follow-up Information    Amador Cunas CHRISTOPHER, PA-C Follow up on 11/09/2016.   Specialties:  Orthopedic Surgery, Emergency Medicine Why:  at 10:15am Contact information: [address] [place] [address] [phone]        Donato Heinz, MD Follow up on 12/07/2016.   Specialty:  Orthopedic Surgery Why:  at 11:15am Contact information: [address] [facility] [place] [address] [phone]          Signed: Meriel Pica PA-C 10/27/2016, 8:58 AM

## 2016-10-27 NOTE — Anesthesia Postprocedure Evaluation (Signed)
Anesthesia Post Note  Patient: Jane [name]  Procedure(s) Performed: Procedure(s) (LRB): COMPUTER ASSISTED TOTAL KNEE ARTHROPLASTY (Right)  Patient location during evaluation: Nursing Unit Anesthesia Type: Spinal Level of consciousness: awake, awake and alert and oriented Pain management: pain level controlled Vital Signs Assessment: post-procedure vital signs reviewed and stable Respiratory status: spontaneous breathing, nonlabored ventilation and respiratory function stable Cardiovascular status: blood pressure returned to baseline and stable Postop Assessment: no headache, no backache and no signs of nausea or vomiting Anesthetic complications: no    Last Vitals:  Vitals:   10/26/16 2015 10/27/16 0407  BP: (!) 147/56 (!) 121/45  Pulse: 88 70  Resp: 18 18  Temp: 36.8 C 36.8 C    Last Pain:  Vitals:   10/27/16 0745  TempSrc:   PainSc: 4                  Chiropodisttephanie Mecca Guitron

## 2016-10-27 NOTE — Evaluation (Signed)
Occupational Therapy Evaluation Patient Details Name: Jane [name] MRN: [id number] DOB: Nov 24, 1961 Today's Date: 10/27/2016    History of Present Illness Pt. is a 55 y.o. female who was admitted to [facility] for an elective Right TKR.      Clinical Impression  Pt. Is a 55 y.o. female who was admitted for a Right TKR. Pt presents with limited ROM, pain, weakness, and impaired functional mobility which hinder her ability to complete ADL and IADL tasks. Pt. could benefit from skilled OT services to review A/E use for LE ADLs, to review necessary home modifications, and to improve functional mobility for ADL/IADLs in order to work towards regaining Independence with ADL/IADLs. Pt. Resides at home with her husband, and plans to return home at discharge.    Follow Up Recommendations       Equipment Recommendations       Recommendations for Other Services       Precautions / Restrictions Restrictions Weight Bearing Restrictions: Yes RLE Weight Bearing: Weight bearing as tolerated                                                     ADL Overall ADL's : Needs assistance/impaired Eating/Feeding: Set up   Grooming: Set up               Lower Body Dressing: Minimal assistance                 General ADL Comments: Pt. education was provided about A/E use for LE ADLs.     Vision     Perception     Praxis      Pertinent Vitals/Pain Pain Assessment: 0-10 Pain Score: 5  Pain Intervention(s): Monitored during session;Repositioned     Hand Dominance Right   Extremity/Trunk Assessment Upper Extremity Assessment Upper Extremity Assessment: Overall WFL for tasks assessed           Communication Communication Communication: No difficulties   Cognition Arousal/Alertness: Awake/alert Behavior During Therapy: WFL for tasks assessed/performed Overall Cognitive Status: Within Functional Limits for tasks assessed                      General Comments       Exercises       Shoulder Instructions      Home Living Family/patient expects to be discharged to:: Private residence Living Arrangements: Spouse/significant other Available Help at Discharge: Family Type of Home: House Home Access: Stairs to enter Secretary/administratorntrance Stairs-Number of Steps: 4 Entrance Stairs-Rails: Left Home Layout: Laundry or work area in basement;Able to live on main level with bedroom/bathroom     Bathroom Shower/Tub: Walk-in shower;Tub/shower unit;Door Shower/tub characteristics: Door Bathroom Toilet: Handicapped height     Home Equipment: Bedside commode;Walker - 2 wheels;Cane - single point;Grab bars - tub/shower          Prior Functioning/Environment Level of Independence: Independent                 OT Problem List:     OT Treatment/Interventions:      OT Goals(Current goals can be found in the care plan section) Acute Rehab OT Goals Patient Stated Goal: To return home at her PLOF OT Goal Formulation: With patient Potential to Achieve Goals: Good  OT Frequency:     Barriers to D/C:  Co-evaluation              End of Session    Activity Tolerance:  Pt. Tolerated treatment session well Patient left:  In chair with call bell within reach, and alarm in place.   Time: [id number] OT Time Calculation (min): 23 min Charges:  OT General Charges $OT Visit: 1 Procedure OT Evaluation $OT Eval Moderate Complexity: 1 Procedure G-Codes:    Olegario MessierElaine Kaitlan Bin, MS, OTR/L 10/27/2016, 10:41 AM

## 2016-10-27 NOTE — Progress Notes (Signed)
Physical Therapy Treatment Patient Details Name: Jane HoseVickie H Semidey MRN: [id number] DOB: [birth year] Today's Date: 10/27/2016    History of Present Illness Pt. is a 55 y.o. female who was [facility] to [facility] for an elective Right TKR.     PT Comments    Patient demonstrates significant improvement with gait pattern and weight acceptance onto the surgical Le. Patient demonstrates ability to safely perform stairs requiring minimal cueing on LE and UE placement throughout movement. Patient demonstrates improved gait speed secondary to instruction/cueing on proper stride length with B Le. Recommend D/C to home with HHPT.   Follow Up Recommendations  Home health PT     Equipment Recommendations  None recommended by PT    Recommendations for Other Services       Precautions / Restrictions Precautions Precautions: Knee;Fall Restrictions Weight Bearing Restrictions: Yes RLE Weight Bearing: Weight bearing as tolerated    Mobility  Bed Mobility                  Transfers Overall transfer level: Needs assistance Equipment used: Rolling walker (2 wheeled) Transfers: Sit to/from Stand Sit to Stand: Supervision         General transfer comment: Patient required increased time to complete sit to stand transfer and verbal cueing on weight bearing onto surgical LE  Ambulation/Gait Ambulation/Gait assistance: Supervision Ambulation Distance (Feet): 150 Feet Assistive device: Rolling walker (2 wheeled) Gait Pattern/deviations: Decreased step length - right;Decreased weight shift to right;Antalgic;Decreased stride length Gait velocity: Decreased Gait velocity interpretation: Below normal speed for age/gender General Gait Details: Patient required cueing to take larger steps with ambulation and focusing on performing step throughout gait pattern. Patient required use of UE to perform with mild increase in pain after performing ambulation.    Stairs Stairs: Yes Stairs assistance:  Supervision Stair Management: One rail Left Number of Stairs: 4 General stair comments: Patient required verbal cueing on hand placement and stepping progression with stair performance. Decreased speed with performance with less weight acceptance onto surgical LE  Wheelchair Mobility    Modified Rankin (Stroke Patients Only)       Balance Overall balance assessment: Needs assistance Sitting-balance support: No upper extremity supported Sitting balance-Leahy Scale: Good     Standing balance support: Bilateral upper extremity supported Standing balance-Leahy Scale: Fair Standing balance comment: Able to maintain standing balance without UE support but unable to move outside BOS without use of UE support.                     Cognition Arousal/Alertness: Awake/alert Behavior During Therapy: WFL for tasks assessed/performed Overall Cognitive Status: Within Functional Limits for tasks assessed                      Exercises Total Joint Exercises Ankle Circles/Pumps: Strengthening;Both;10 reps;Seated Quad Sets: Strengthening;Both;10 reps;Seated Gluteal Sets: Strengthening;Both;10 reps;Seated Hip ABduction/ADduction: Strengthening;Right;10 reps;Supine Straight Leg Raises: Right;Strengthening;10 reps;Seated Long Arc Quad: Strengthening;Right;10 reps;Seated Goniometric ROM: -6 - 65 limited by pain Marching in Standing: AROM;Both;10 reps;Standing Other Exercises Other Exercises: Performed standing weight shifts x10 bilaterally.     General Comments        Pertinent Vitals/Pain Pain Assessment: 0-10 Pain Score: 5  Pain Location: R knee Pain Descriptors / Indicators: Aching Pain Intervention(s): Monitored during session    Home Living Family/patient expects to be discharged to:: Private residence Living Arrangements: Spouse/significant other Available Help at Discharge: Family Type of Home: House Home Access: Stairs to enter Entrance Stairs-Rails:  Left  Home Layout: Laundry or work area in basement;Able to live on main level with bedroom/bathroom Home Equipment: Bedside commode;Walker - 2 wheels;Cane - single point;Grab bars - tub/shower      Prior Function Level of Independence: Independent      Comments: Fully independent with ADLs/IADLs. No falls   PT Goals (current goals can now be found in the care plan section) Acute Rehab PT Goals Patient Stated Goal: To return home at her PLOF PT Goal Formulation: With patient Time For Goal Achievement: 11/09/16 Potential to Achieve Goals: Good    Frequency    BID      PT Plan      Co-evaluation             End of Session Equipment Utilized During Treatment: Gait belt Activity Tolerance: Patient tolerated treatment well Patient left: in chair;with call bell/phone within reach;with chair alarm set;with SCD's reapplied;Other (comment)     Time: 1610-9604: 0848-0920 PT Time Calculation (min) (ACUTE ONLY): 32 min  Charges:  $Therapeutic Exercise: 8-22 mins $Therapeutic Activity: 8-22 mins                    G Codes:      Myrene GalasWesley Durwin Davisson, PT DPT 10/27/16, 12:31 PM [phone]

## 2016-10-27 NOTE — Progress Notes (Signed)
Subjective: 2 Days Post-Op Procedure(s) (LRB): COMPUTER ASSISTED TOTAL KNEE ARTHROPLASTY (Right) Patient reports pain as mild.   Patient is well, and has had no acute complaints or problems Plan is to go Home after hospital stay. Negative for chest pain and shortness of breath Fever: no Gastrointestinal:Negative for N/V this AM.  Objective: Vital signs in last 24 hours: Temp:  [98.2 F (36.8 C)] 98.2 F (36.8 C) (11/24 0407) Pulse Rate:  [70-88] 70 (11/24 0407) Resp:  [18] 18 (11/24 0407) BP: (121-147)/(45-56) 121/45 (11/24 0407) SpO2:  [95 %-96 %] 95 % (11/24 0407)  Intake/Output from previous day:  Intake/Output Summary (Last 24 hours) at 10/27/16 0852 Last data filed at 10/26/16 1800  Gross per 24 hour  Intake              840 ml  Output              200 ml  Net              640 ml    Intake/Output this shift: No intake/output data recorded.  Labs:  Recent Labs  10/26/16 0634 10/27/16 0609  HGB 10.8* 11.0*    Recent Labs  10/26/16 0634 10/27/16 0609  WBC 9.8 10.2  RBC 3.58* 3.63*  HCT 31.2* 31.7*  PLT 186 182    Recent Labs  10/26/16 0634 10/27/16 0609  NA 138 138  K 3.8 3.8  CL 104 105  CO2 29 27  BUN 15 10  CREATININE 0.72 0.47  GLUCOSE 130* 118*  CALCIUM 8.1* 8.5*   No results for input(s): LABPT, INR in the last 72 hours.   EXAM General - Patient is Alert, Appropriate and Oriented Extremity - Neurologically intact ABD soft Sensation intact distally Dorsiflexion/Plantar flexion intact Incision: dressing C/D/I Dressing/Incision - clean, dry, no drainage Motor Function - intact, moving foot and toes well on exam.   Bulky dressing removed and Hemovac removed today, 4x4 with tegaderm placed over pin sites. Pt is able to perform a straight leg raise with mild assistance. Abdomen soft on exam with normal BS.  Past Medical History:  Diagnosis Date  . Arthritis    Knees, feet, elbows , neck  . Asthma    mild; assoc with seasonal  allergies; uses Albuterol once per year on average.  Jane [name]. Atypical chest pain    hx   . Complication of anesthesia    nausea, last 2 surgeries nausea much less  . Fibromyalgia 12/04/2010   [facility] in [place]; Dr. Gwenevere GhaziNicole Keltt; followed every six months.  Jane [name]. GERD (gastroesophageal reflux disease)   . Heart murmur    Mitral valve Prolapse  . HLD (hyperlipidemia)   . Hypothyroidism post radioactive iodine   Hyperthyroidism; now secondary hypothyroidism  . MR (mitral regurgitation)    moderate; probably MVP. [facility] with normal EF, moderate MR     Assessment/Plan: 2 Days Post-Op Procedure(s) (LRB): COMPUTER ASSISTED TOTAL KNEE ARTHROPLASTY (Right) Active Problems:   S/P total knee arthroplasty  Estimated body mass index is 32.11 kg/m as calculated from the following:   Height as of this encounter: 5\' 7"  (1.702 m).   Weight as of this encounter: 93 kg (205 lb). Up with therapy  Labs reviewed, Hg 11.0. Bulky dressing removed, honeycomb dressing intact. Will work on having a bowel movement today. PT recommending HHPT, ambulated 30 feet yesterday with PT Plan will be for possible discharge today pending PT and bowel movement.  DVT Prophylaxis -  Lovenox, Foot Pumps and TED hose Weight-Bearing as tolerated to right leg  J. Horris LatinoLance Dahlton Hinde, PA-C [facility] 10/27/2016, 8:52 AM

## 2016-10-27 NOTE — Progress Notes (Signed)
Clinical Child psychotherapistocial Worker (CSW) received SNF consult. PT is recommending home health. RN case manager is aware of above. Please recosnult if future social work needs arise. CSW signing off.   Baker Hughes IncorporatedBailey Berenis Corter, LCSW [phone]

## 2016-10-27 NOTE — Care Management (Signed)
Admitted to [facility] with the diagnosis of total knee replacement 10/25/18.  Lives with husband, Providence CrosbyBurtch [phone]). Works at the [employer]. Last seen Dr. Ailene ArdsKristie Smith 4-5 months ago. No home health. No skilled facility. No home oxygen. Bedside commode, rolling walker, cane, and walk-in-shower in the home. No falls. Good appetite. Prescriptions are filled at [facility] [address],  Physical therapy evaluation completed. Recommends home with home health and physical therapy. Discussed home health agencies. Chose [facility], representative, Brad updated.  Discharge to home today per Dr. Ernest PineHooten

## 2016-10-27 NOTE — Discharge Instructions (Signed)

## 2016-10-27 NOTE — Progress Notes (Signed)
Pt. Alert and oriented. VSS. Pain controlled with PO pain meds. Neurochecks WDL. Pt. Up using BSC with little difficulty. Rested quietly throughout the night. Will continue to monitor.

## 2016-10-30 ENCOUNTER — Emergency Department
Admission: EM | Admit: 2016-10-30 | Discharge: 2016-10-30 | Disposition: A | Discharge status: Home / Self Care | Payer: Federal, State, Local not specified - PPO | Attending: Emergency Medicine | Admitting: Emergency Medicine

## 2016-10-30 ENCOUNTER — Emergency Department: Payer: Federal, State, Local not specified - PPO

## 2016-10-30 DIAGNOSIS — R55 Syncope and collapse: Secondary | ICD-10-CM | POA: Insufficient documentation

## 2016-10-30 DIAGNOSIS — Z87891 Personal history of nicotine dependence: Secondary | ICD-10-CM | POA: Diagnosis not present

## 2016-10-30 DIAGNOSIS — J45909 Unspecified asthma, uncomplicated: Secondary | ICD-10-CM | POA: Diagnosis not present

## 2016-10-30 DIAGNOSIS — E039 Hypothyroidism, unspecified: Secondary | ICD-10-CM | POA: Diagnosis not present

## 2016-10-30 DIAGNOSIS — R1030 Lower abdominal pain, unspecified: Secondary | ICD-10-CM | POA: Diagnosis not present

## 2016-10-30 DIAGNOSIS — Z79899 Other long term (current) drug therapy: Secondary | ICD-10-CM | POA: Insufficient documentation

## 2016-10-30 LAB — CBC WITH DIFFERENTIAL/PLATELET
Basophils Absolute: 0 10*3/uL (ref 0–0.1)
Basophils Relative: 0 %
EOS PCT: 3 %
Eosinophils Absolute: 0.3 10*3/uL (ref 0–0.7)
HEMATOCRIT: 34.2 % — AB (ref 35.0–47.0)
Hemoglobin: 11.5 g/dL — ABNORMAL LOW (ref 12.0–16.0)
LYMPHS ABS: 0.6 10*3/uL — AB (ref 1.0–3.6)
LYMPHS PCT: 7 %
MCH: 28.9 pg (ref 26.0–34.0)
MCHC: 33.7 g/dL (ref 32.0–36.0)
MCV: 85.8 fL (ref 80.0–100.0)
MONO ABS: 0.6 10*3/uL (ref 0.2–0.9)
Monocytes Relative: 7 %
NEUTROS ABS: 7.8 10*3/uL — AB (ref 1.4–6.5)
Neutrophils Relative %: 83 %
PLATELETS: 245 10*3/uL (ref 150–440)
RBC: 3.99 MIL/uL (ref 3.80–5.20)
RDW: 13 % (ref 11.5–14.5)
WBC: 9.4 10*3/uL (ref 3.6–11.0)

## 2016-10-30 LAB — COMPREHENSIVE METABOLIC PANEL
ALK PHOS: 61 U/L (ref 38–126)
ALT: 23 U/L (ref 14–54)
ANION GAP: 8 (ref 5–15)
AST: 23 U/L (ref 15–41)
Albumin: 3.7 g/dL (ref 3.5–5.0)
BILIRUBIN TOTAL: 0.5 mg/dL (ref 0.3–1.2)
BUN: 18 mg/dL (ref 6–20)
CALCIUM: 8.9 mg/dL (ref 8.9–10.3)
CO2: 27 mmol/L (ref 22–32)
CREATININE: 0.74 mg/dL (ref 0.44–1.00)
Chloride: 104 mmol/L (ref 101–111)
Glucose, Bld: 135 mg/dL — ABNORMAL HIGH (ref 65–99)
Potassium: 3.2 mmol/L — ABNORMAL LOW (ref 3.5–5.1)
SODIUM: 139 mmol/L (ref 135–145)
TOTAL PROTEIN: 7 g/dL (ref 6.5–8.1)

## 2016-10-30 LAB — URINALYSIS COMPLETE WITH MICROSCOPIC (ARMC ONLY)
BACTERIA UA: NONE SEEN
Bilirubin Urine: NEGATIVE
GLUCOSE, UA: NEGATIVE mg/dL
Ketones, ur: NEGATIVE mg/dL
LEUKOCYTES UA: NEGATIVE
NITRITE: NEGATIVE
PROTEIN: NEGATIVE mg/dL
SPECIFIC GRAVITY, URINE: 1.013 (ref 1.005–1.030)
pH: 6 (ref 5.0–8.0)

## 2016-10-30 LAB — TYPE AND SCREEN
ABO/RH(D): O POS
Antibody Screen: NEGATIVE

## 2016-10-30 LAB — PROTIME-INR
INR: 0.97
Prothrombin Time: 12.9 seconds (ref 11.4–15.2)

## 2016-10-30 LAB — APTT: APTT: 31 s (ref 24–36)

## 2016-10-30 LAB — TROPONIN I: Troponin I: <0.03 ng/mL (ref <0.03)

## 2016-10-30 MED ORDER — DEXTROSE 5 % IV SOLN: 1.0000 g | Freq: Once | INTRAVENOUS | Status: DC

## 2016-10-30 MED ORDER — SODIUM CHLORIDE 0.9 % IV BOLUS (SEPSIS)
1000.0000 mL | Freq: Once | INTRAVENOUS | Status: AC
  Administered 2016-10-30: 1000 mL via INTRAVENOUS

## 2016-10-30 NOTE — ED Notes (Signed)
Patient transported to X-ray 

## 2016-10-30 NOTE — ED Provider Notes (Addendum)
[facility] Emergency Department Provider Note  ____________________________________________   I have reviewed the triage vital signs and the nursing notes.   HISTORY  Chief Complaint Loss of Consciousness and Abdominal Pain    HPI Jane [name] is a 55 y.o. female who had a knee replacement 5 days ago. Patient states he was on the toilet today. She is having diarrhea. It was dark brown but not black. She had a bad cramp in her stomach, and she "passed out" for a second. Patient states that she has had no melena bright red blood per rectum. Her abdominal pain is completely gone she has no tenderness or pain to her abdomen. She denies fever or vomiting. Denies hematemesis. Denies chest pain or shortness of breath. Denies increased pain to the wound. She states that she is in laboratory with a walker. She did not hit her head when she fell. She was sort of slumped over on her husband for a few seconds.No seizure activity. She states "I think I just passed out because I was having cramps in my stool. Patient has a history of fibromyalgia. She has had no shortness of breath or cough. She is on Lanoxin       Past Medical History:  Diagnosis Date  . Arthritis    Knees, feet, elbows , neck  . Asthma    mild; assoc with seasonal allergies; uses Albuterol once per year on average.  Marland Kitchen Atypical chest pain    hx   . Complication of anesthesia    nausea, last 2 surgeries nausea much less  . Fibromyalgia 12/04/2010   [facility] in [place]; Dr. Gwenevere Ghazi; followed every six months.  Marland Kitchen GERD (gastroesophageal reflux disease)   . Heart murmur    Mitral valve Prolapse  . HLD (hyperlipidemia)   . Hypothyroidism post radioactive iodine   Hyperthyroidism; now secondary hypothyroidism  . MR (mitral regurgitation)    moderate; probably MVP. echo 3/10 at [facility] cardiology with normal EF, moderate MR     Patient Active Problem List   Diagnosis Date Noted  .  S/P total knee arthroplasty 10/25/2016  . Vitamin D deficiency 09/23/2016  . Hyperlipidemia 01/01/2016  . Hypothyroidism due to acquired atrophy of thyroid 01/01/2016  . Glucose intolerance (impaired glucose tolerance) 01/01/2016  . Obesity 01/01/2016  . Gastroesophageal reflux disease without esophagitis 01/01/2016  . Asthma, mild intermittent 01/01/2016  . Anxiety state 11/09/2014  . Osteoarthritis, knee 10/20/2012  . Fibromyalgia 10/20/2012  . Hypothyroidism following radioiodine therapy 10/20/2012  . ALLERGIC RHINITIS 02/24/2010  . HYPERLIPIDEMIA 12/31/2009  . MURMUR 12/31/2009    Past Surgical History:  Procedure Laterality Date  . COLONOSCOPY  12/04/2009   normal; [facility]  . inner thigh surgery    . JOINT REPLACEMENT    . KNEE ARTHROPLASTY Right 10/25/2016   Procedure: COMPUTER ASSISTED TOTAL KNEE ARTHROPLASTY;  Surgeon: Donato Heinz, MD;  Location: [facility] ORS;  Service: Orthopedics;  Laterality: Right;  . KNEE SURGERY  7/10  . 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 left foot surgery     for plantar fascitis  . novascue uterine ablation  2007  . REPLACEMENT TOTAL KNEE Left     Prior to Admission medications   Medication Sig Start Date End Date Taking? Authorizing Provider  albuterol (PROVENTIL HFA;VENTOLIN HFA) 108 (90 Base) MCG/ACT inhaler Inhale 2 puffs into the lungs every 6 (six) hours as needed for wheezing or shortness of breath (cough, shortness of breath or  wheezing.). 01/01/16  Yes Ethelda Chick, MD  enoxaparin (LOVENOX) 40 MG/0.4ML injection Inject 0.4 mLs (40 mg total) into the skin daily. 10/27/16  Yes Anson Oregon, PA-C  levothyroxine (SYNTHROID, LEVOTHROID) 112 MCG tablet TAKE 1 TABLET EVERY DAY 30 MINUTES BEFORE BREAKFAST 01/01/16  Yes Ethelda Chick, MD  omeprazole (PRILOSEC) 20 MG capsule Take 1 capsule (20 mg total) by mouth daily. 01/01/16  Yes Ethelda Chick, MD  oxyCODONE (OXY IR/ROXICODONE) 5 MG immediate release tablet Take 1-2 tablets (5-10 mg  total) by mouth every 4 (four) hours as needed for severe pain or breakthrough pain. 10/27/16  Yes Anson Oregon, PA-C  pantoprazole (PROTONIX) 40 MG tablet Take 40 mg by mouth daily.  09/19/16 09/19/17 Yes Historical Provider, MD  Vitamin D, Ergocalciferol, (DRISDOL) 50000 units CAPS capsule Take 1 capsule (50,000 Units total) by mouth every 7 (seven) days. Patient taking differently: Take 50,000 Units by mouth every 7 (seven) days. Fridays 08/23/16  Yes Ethelda Chick, MD    Allergies Celebrex [celecoxib]  Family History  Problem Relation Age of Onset  . Stroke Father 81    CVA  . COPD Father   . Hyperlipidemia Mother   . Heart disease Mother 35    CABG in 77s; AMI age 70  . Hypertension Mother   . Hypertension Sister     Social History Social History  Substance Use Topics  . Smoking status: Former Smoker    Types: Cigarettes    Quit date: 10/12/1994  . Smokeless tobacco: Never Used     Comment: smoked 2ppd for 15 years; quit in 1995   . Alcohol use 0.0 oz/week     Comment: social, rare    Review of Systems }Constitutional: No fever/chills Eyes: No visual changes. ENT: No sore throat. No stiff neck no neck pain Cardiovascular: Denies chest pain. Respiratory: Denies shortness of breath. Gastrointestinal:   no vomiting.  No diarrhea.  No constipation. Genitourinary: Negative for dysuria. Musculoskeletal: Negative lower extremity swelling Skin: Negative for rash. Neurological: Negative for severe headaches, focal weakness or numbness. 10-point ROS otherwise negative.  ____________________________________________   PHYSICAL EXAM:  VITAL SIGNS: ED Triage Vitals  Enc Vitals Group     BP 10/30/16 1638 135/69     Pulse Rate 10/30/16 1638 78     Resp 10/30/16 1638 17     Temp 10/30/16 1639 98.2 F (36.8 C)     Temp src --      SpO2 10/30/16 1638 100 %     Weight 10/30/16 1634 205 lb (93 kg)     Height 10/30/16 1634 5\' 7"  (1.702 m)     Head Circumference --       Peak Flow --      Pain Score 10/30/16 1634 0     Pain Loc --      Pain Edu? --      Excl. in GC? --     Constitutional: Alert and oriented. Well appearing and in no acute distress. Eyes: Conjunctivae are normal. PERRL. EOMI. Head: Atraumatic. Nose: No congestion/rhinnorhea. Mouth/Throat: Mucous membranes are moist.  Oropharynx non-erythematous. Neck: No stridor.   Nontender with no meningismus Cardiovascular: Normal rate, regular rhythm. Grossly normal heart sounds.  Good peripheral circulation. Respiratory: Normal respiratory effort.  No retractions. Lungs CTAB. Abdominal: Soft and nontender. No distention. No guarding no rebound Back:  There is no focal tenderness or step off.  there is no midline tenderness there are no lesions noted. there  is no CVA tenderness Musculoskeletal: No lower extremity tenderness, no upper extremity tenderness. No joint effusions, no DVT signs strong distal pulses no edema, wound site is clean dry and intact with no evidence of infection. It is not hot to touch does not red. Sutures are in place. Neurologic:  Normal speech and language. No gross focal neurologic deficits are appreciated.  Skin:  Skin is warm, dry and intact. No rash noted. Psychiatric: Mood and affect are normal. Speech and behavior are normal.  ____________________________________________   LABS (all labs ordered are listed, but only abnormal results are displayed)  Labs Reviewed  COMPREHENSIVE METABOLIC PANEL - Abnormal; Notable for the following:       Result Value   Potassium 3.2 (*)    Glucose, Bld 135 (*)    All other components within normal limits  CBC WITH DIFFERENTIAL/PLATELET - Abnormal; Notable for the following:    Hemoglobin 11.5 (*)    HCT 34.2 (*)    Neutro Abs 7.8 (*)    Lymphs Abs 0.6 (*)    All other components within normal limits  PROTIME-INR  APTT  TROPONIN I  URINALYSIS COMPLETEWITH MICROSCOPIC ([facility] ONLY)  TYPE AND SCREEN    ____________________________________________  EKG  I personally interpreted any EKGs ordered by me or triage  _________Normal sinus rhythm at 82 bpm no acute ST elevation or depression normal axis ___________________________________  RADIOLOGY  I reviewed any imaging ordered by me or triage that were performed during my shift and, if possible, patient and/or family made aware of any abnormal findings. ____________________________________________   PROCEDURES  Procedure(s) performed: None  Procedures  Critical Care performed: None  ____________________________________________   INITIAL IMPRESSION / ASSESSMENT AND PLAN / ED COURSE  Pertinent labs & imaging results that were available during my care of the patient were reviewed by me and considered in my medical decision making (see chart for details).  Very well-appearing woman who apparently was having diarrhea, had a cramp and became unresponsive for a few seconds. Now with no complaints or fever. She is well appearing. She did not have any closed head injury. She is got no ongoing complaints. This may have been a vasovagal event. Serial abdominal exams are completely benign. She is guaiac-negative from below with no evidence of rectal bleeding and a normal hemoglobin for her. We will check a chest x-ray, and urinalysis of those things are reassuring, I think patient likely be able to safely go home. Especially since she has no evidence of bleeding. Wound is quite well-appearing.  ----------------------------------------- 7:22 PM on 10/30/2016 -----------------------------------------  Patient remains very well-appearing vital signs quite reassuring abdomen completely benign serial exams workup reassuring x-ray reassuring patient has no complaints. She is appears to have symptoms of a syncopal event while having gas pain on the toilet which in was in her lower abdomen which is completely resolved at this time. Deep palpation  in all quadrants treated no evidence of abdominal pathology. Patient has had 1 loose stool and before that she was having normal stools she states. I do not think she likely has C. Difficile. Discussed with the patient the pluses and minuses of CT scan and she declines and I think is not unreasonable. Extensive return precautions given and understood. We did discuss with Dr. Rosita KeaMenz of orthopedic surgery, who agrees with management and discharge and will follow up as an outpatient.  Clinical Course    ____________________________________________   FINAL CLINICAL IMPRESSION(S) / ED DIAGNOSES  Final diagnoses:  None  This chart was dictated using voice recognition software.  Despite best efforts to proofread,  errors can occur which can change meaning.      Jeanmarie PlantJames A Dantre Yearwood, MD 10/30/16 1813    Jeanmarie PlantJames A Anairis Knick, MD 10/30/16 [phone]

## 2016-10-30 NOTE — ED Triage Notes (Signed)
Pt from home via EMS, reports recent total knee replacement weds and was placed on Lovenox. Reports dark stools since yesterday. This morning reports had syncopal episode after bowel movement

## 2016-10-31 DIAGNOSIS — E785 Hyperlipidemia, unspecified: Secondary | ICD-10-CM | POA: Diagnosis not present

## 2016-10-31 DIAGNOSIS — E039 Hypothyroidism, unspecified: Secondary | ICD-10-CM | POA: Diagnosis not present

## 2016-10-31 DIAGNOSIS — F419 Anxiety disorder, unspecified: Secondary | ICD-10-CM | POA: Diagnosis not present

## 2016-10-31 DIAGNOSIS — Z8679 Personal history of other diseases of the circulatory system: Secondary | ICD-10-CM | POA: Diagnosis not present

## 2016-10-31 DIAGNOSIS — Z7901 Long term (current) use of anticoagulants: Secondary | ICD-10-CM | POA: Diagnosis not present

## 2016-10-31 DIAGNOSIS — Z79891 Long term (current) use of opiate analgesic: Secondary | ICD-10-CM | POA: Diagnosis not present

## 2016-10-31 DIAGNOSIS — M159 Polyosteoarthritis, unspecified: Secondary | ICD-10-CM | POA: Diagnosis not present

## 2016-10-31 DIAGNOSIS — Z96651 Presence of right artificial knee joint: Secondary | ICD-10-CM | POA: Diagnosis not present

## 2016-10-31 DIAGNOSIS — J439 Emphysema, unspecified: Secondary | ICD-10-CM | POA: Diagnosis not present

## 2016-10-31 DIAGNOSIS — M797 Fibromyalgia: Secondary | ICD-10-CM | POA: Diagnosis not present

## 2016-10-31 DIAGNOSIS — K219 Gastro-esophageal reflux disease without esophagitis: Secondary | ICD-10-CM | POA: Diagnosis not present

## 2016-10-31 DIAGNOSIS — Z471 Aftercare following joint replacement surgery: Secondary | ICD-10-CM | POA: Diagnosis not present

## 2016-10-31 DIAGNOSIS — E559 Vitamin D deficiency, unspecified: Secondary | ICD-10-CM | POA: Diagnosis not present

## 2016-11-02 DIAGNOSIS — Z79891 Long term (current) use of opiate analgesic: Secondary | ICD-10-CM | POA: Diagnosis not present

## 2016-11-02 DIAGNOSIS — K219 Gastro-esophageal reflux disease without esophagitis: Secondary | ICD-10-CM | POA: Diagnosis not present

## 2016-11-02 DIAGNOSIS — E559 Vitamin D deficiency, unspecified: Secondary | ICD-10-CM | POA: Diagnosis not present

## 2016-11-02 DIAGNOSIS — Z8679 Personal history of other diseases of the circulatory system: Secondary | ICD-10-CM | POA: Diagnosis not present

## 2016-11-02 DIAGNOSIS — M797 Fibromyalgia: Secondary | ICD-10-CM | POA: Diagnosis not present

## 2016-11-02 DIAGNOSIS — Z471 Aftercare following joint replacement surgery: Secondary | ICD-10-CM | POA: Diagnosis not present

## 2016-11-02 DIAGNOSIS — M159 Polyosteoarthritis, unspecified: Secondary | ICD-10-CM | POA: Diagnosis not present

## 2016-11-02 DIAGNOSIS — J439 Emphysema, unspecified: Secondary | ICD-10-CM | POA: Diagnosis not present

## 2016-11-02 DIAGNOSIS — E039 Hypothyroidism, unspecified: Secondary | ICD-10-CM | POA: Diagnosis not present

## 2016-11-02 DIAGNOSIS — Z96651 Presence of right artificial knee joint: Secondary | ICD-10-CM | POA: Diagnosis not present

## 2016-11-02 DIAGNOSIS — E785 Hyperlipidemia, unspecified: Secondary | ICD-10-CM | POA: Diagnosis not present

## 2016-11-02 DIAGNOSIS — F419 Anxiety disorder, unspecified: Secondary | ICD-10-CM | POA: Diagnosis not present

## 2016-11-02 DIAGNOSIS — Z7901 Long term (current) use of anticoagulants: Secondary | ICD-10-CM | POA: Diagnosis not present

## 2016-11-03 DIAGNOSIS — Z79891 Long term (current) use of opiate analgesic: Secondary | ICD-10-CM | POA: Diagnosis not present

## 2016-11-03 DIAGNOSIS — E785 Hyperlipidemia, unspecified: Secondary | ICD-10-CM | POA: Diagnosis not present

## 2016-11-03 DIAGNOSIS — Z471 Aftercare following joint replacement surgery: Secondary | ICD-10-CM | POA: Diagnosis not present

## 2016-11-03 DIAGNOSIS — Z7901 Long term (current) use of anticoagulants: Secondary | ICD-10-CM | POA: Diagnosis not present

## 2016-11-03 DIAGNOSIS — Z8679 Personal history of other diseases of the circulatory system: Secondary | ICD-10-CM | POA: Diagnosis not present

## 2016-11-03 DIAGNOSIS — E559 Vitamin D deficiency, unspecified: Secondary | ICD-10-CM | POA: Diagnosis not present

## 2016-11-03 DIAGNOSIS — M159 Polyosteoarthritis, unspecified: Secondary | ICD-10-CM | POA: Diagnosis not present

## 2016-11-03 DIAGNOSIS — Z96651 Presence of right artificial knee joint: Secondary | ICD-10-CM | POA: Diagnosis not present

## 2016-11-03 DIAGNOSIS — F419 Anxiety disorder, unspecified: Secondary | ICD-10-CM | POA: Diagnosis not present

## 2016-11-03 DIAGNOSIS — J439 Emphysema, unspecified: Secondary | ICD-10-CM | POA: Diagnosis not present

## 2016-11-03 DIAGNOSIS — M797 Fibromyalgia: Secondary | ICD-10-CM | POA: Diagnosis not present

## 2016-11-03 DIAGNOSIS — K219 Gastro-esophageal reflux disease without esophagitis: Secondary | ICD-10-CM | POA: Diagnosis not present

## 2016-11-03 DIAGNOSIS — E039 Hypothyroidism, unspecified: Secondary | ICD-10-CM | POA: Diagnosis not present

## 2016-11-06 DIAGNOSIS — F419 Anxiety disorder, unspecified: Secondary | ICD-10-CM | POA: Diagnosis not present

## 2016-11-06 DIAGNOSIS — E785 Hyperlipidemia, unspecified: Secondary | ICD-10-CM | POA: Diagnosis not present

## 2016-11-06 DIAGNOSIS — J439 Emphysema, unspecified: Secondary | ICD-10-CM | POA: Diagnosis not present

## 2016-11-06 DIAGNOSIS — Z96651 Presence of right artificial knee joint: Secondary | ICD-10-CM | POA: Diagnosis not present

## 2016-11-06 DIAGNOSIS — Z471 Aftercare following joint replacement surgery: Secondary | ICD-10-CM | POA: Diagnosis not present

## 2016-11-06 DIAGNOSIS — K219 Gastro-esophageal reflux disease without esophagitis: Secondary | ICD-10-CM | POA: Diagnosis not present

## 2016-11-06 DIAGNOSIS — E559 Vitamin D deficiency, unspecified: Secondary | ICD-10-CM | POA: Diagnosis not present

## 2016-11-06 DIAGNOSIS — Z79891 Long term (current) use of opiate analgesic: Secondary | ICD-10-CM | POA: Diagnosis not present

## 2016-11-06 DIAGNOSIS — M797 Fibromyalgia: Secondary | ICD-10-CM | POA: Diagnosis not present

## 2016-11-06 DIAGNOSIS — Z7901 Long term (current) use of anticoagulants: Secondary | ICD-10-CM | POA: Diagnosis not present

## 2016-11-06 DIAGNOSIS — Z8679 Personal history of other diseases of the circulatory system: Secondary | ICD-10-CM | POA: Diagnosis not present

## 2016-11-06 DIAGNOSIS — M159 Polyosteoarthritis, unspecified: Secondary | ICD-10-CM | POA: Diagnosis not present

## 2016-11-06 DIAGNOSIS — E039 Hypothyroidism, unspecified: Secondary | ICD-10-CM | POA: Diagnosis not present

## 2016-11-07 DIAGNOSIS — J439 Emphysema, unspecified: Secondary | ICD-10-CM | POA: Diagnosis not present

## 2016-11-07 DIAGNOSIS — Z471 Aftercare following joint replacement surgery: Secondary | ICD-10-CM | POA: Diagnosis not present

## 2016-11-07 DIAGNOSIS — Z96651 Presence of right artificial knee joint: Secondary | ICD-10-CM | POA: Diagnosis not present

## 2016-11-07 DIAGNOSIS — K219 Gastro-esophageal reflux disease without esophagitis: Secondary | ICD-10-CM | POA: Diagnosis not present

## 2016-11-07 DIAGNOSIS — E039 Hypothyroidism, unspecified: Secondary | ICD-10-CM | POA: Diagnosis not present

## 2016-11-07 DIAGNOSIS — E785 Hyperlipidemia, unspecified: Secondary | ICD-10-CM | POA: Diagnosis not present

## 2016-11-07 DIAGNOSIS — Z79891 Long term (current) use of opiate analgesic: Secondary | ICD-10-CM | POA: Diagnosis not present

## 2016-11-07 DIAGNOSIS — Z7901 Long term (current) use of anticoagulants: Secondary | ICD-10-CM | POA: Diagnosis not present

## 2016-11-07 DIAGNOSIS — E559 Vitamin D deficiency, unspecified: Secondary | ICD-10-CM | POA: Diagnosis not present

## 2016-11-07 DIAGNOSIS — F419 Anxiety disorder, unspecified: Secondary | ICD-10-CM | POA: Diagnosis not present

## 2016-11-07 DIAGNOSIS — Z8679 Personal history of other diseases of the circulatory system: Secondary | ICD-10-CM | POA: Diagnosis not present

## 2016-11-07 DIAGNOSIS — M797 Fibromyalgia: Secondary | ICD-10-CM | POA: Diagnosis not present

## 2016-11-07 DIAGNOSIS — M159 Polyosteoarthritis, unspecified: Secondary | ICD-10-CM | POA: Diagnosis not present

## 2016-11-21 DIAGNOSIS — Z471 Aftercare following joint replacement surgery: Secondary | ICD-10-CM | POA: Diagnosis not present

## 2016-12-08 ENCOUNTER — Encounter: Payer: Self-pay | Admitting: Family Medicine

## 2017-01-29 DIAGNOSIS — M9904 Segmental and somatic dysfunction of sacral region: Secondary | ICD-10-CM | POA: Diagnosis not present

## 2017-01-29 DIAGNOSIS — M9905 Segmental and somatic dysfunction of pelvic region: Secondary | ICD-10-CM | POA: Diagnosis not present

## 2017-01-29 DIAGNOSIS — M5387 Other specified dorsopathies, lumbosacral region: Secondary | ICD-10-CM | POA: Diagnosis not present

## 2017-02-06 DIAGNOSIS — M1811 Unilateral primary osteoarthritis of first carpometacarpal joint, right hand: Secondary | ICD-10-CM | POA: Diagnosis not present

## 2017-02-06 DIAGNOSIS — M5387 Other specified dorsopathies, lumbosacral region: Secondary | ICD-10-CM | POA: Diagnosis not present

## 2017-02-06 DIAGNOSIS — M531 Cervicobrachial syndrome: Secondary | ICD-10-CM | POA: Diagnosis not present

## 2017-02-06 DIAGNOSIS — M9903 Segmental and somatic dysfunction of lumbar region: Secondary | ICD-10-CM | POA: Diagnosis not present

## 2017-02-07 ENCOUNTER — Telehealth: Payer: Self-pay | Admitting: Family Medicine

## 2017-02-07 DIAGNOSIS — E034 Atrophy of thyroid (acquired): Secondary | ICD-10-CM

## 2017-02-07 NOTE — Telephone Encounter (Signed)
Pt is calling to check the status of her levothyroxine refill.  She states that the pharmacy has been trying to send in refill requests but they have not heard anything yet.  Pt did verify that the pharmacy she uses is the [facility] in [place].   [phone]

## 2017-02-08 MED ORDER — LEVOTHYROXINE SODIUM 112 MCG PO TABS: ORAL_TABLET | ORAL | 0 refills | Status: DC

## 2017-03-07 DIAGNOSIS — M9902 Segmental and somatic dysfunction of thoracic region: Secondary | ICD-10-CM | POA: Diagnosis not present

## 2017-03-07 DIAGNOSIS — M5387 Other specified dorsopathies, lumbosacral region: Secondary | ICD-10-CM | POA: Diagnosis not present

## 2017-03-07 DIAGNOSIS — M9903 Segmental and somatic dysfunction of lumbar region: Secondary | ICD-10-CM | POA: Diagnosis not present

## 2017-03-07 DIAGNOSIS — M4004 Postural kyphosis, thoracic region: Secondary | ICD-10-CM | POA: Diagnosis not present

## 2017-04-17 DIAGNOSIS — Z96651 Presence of right artificial knee joint: Secondary | ICD-10-CM | POA: Diagnosis not present

## 2017-04-27 ENCOUNTER — Ambulatory Visit: Payer: Federal, State, Local not specified - PPO | Admitting: Family Medicine

## 2017-05-03 ENCOUNTER — Ambulatory Visit (INDEPENDENT_AMBULATORY_CARE_PROVIDER_SITE_OTHER): Payer: Federal, State, Local not specified - PPO | Admitting: Family Medicine

## 2017-05-03 ENCOUNTER — Encounter: Payer: Self-pay | Admitting: Family Medicine

## 2017-05-03 VITALS — BP 137/80 | HR 69 | Temp 98.8°F | Resp 16 | Ht 67.0 in | Wt 211.2 lb

## 2017-05-03 DIAGNOSIS — E7800 Pure hypercholesterolemia, unspecified: Secondary | ICD-10-CM

## 2017-05-03 DIAGNOSIS — M170 Bilateral primary osteoarthritis of knee: Secondary | ICD-10-CM

## 2017-05-03 DIAGNOSIS — E6609 Other obesity due to excess calories: Secondary | ICD-10-CM

## 2017-05-03 DIAGNOSIS — J301 Allergic rhinitis due to pollen: Secondary | ICD-10-CM | POA: Diagnosis not present

## 2017-05-03 DIAGNOSIS — Z96651 Presence of right artificial knee joint: Secondary | ICD-10-CM

## 2017-05-03 DIAGNOSIS — J4520 Mild intermittent asthma, uncomplicated: Secondary | ICD-10-CM | POA: Diagnosis not present

## 2017-05-03 DIAGNOSIS — E890 Postprocedural hypothyroidism: Secondary | ICD-10-CM

## 2017-05-03 DIAGNOSIS — R7302 Impaired glucose tolerance (oral): Secondary | ICD-10-CM

## 2017-05-03 DIAGNOSIS — M797 Fibromyalgia: Secondary | ICD-10-CM | POA: Diagnosis not present

## 2017-05-03 DIAGNOSIS — Z1231 Encounter for screening mammogram for malignant neoplasm of breast: Secondary | ICD-10-CM

## 2017-05-03 DIAGNOSIS — Z6833 Body mass index (BMI) 33.0-33.9, adult: Secondary | ICD-10-CM

## 2017-05-03 LAB — POCT URINALYSIS DIP (MANUAL ENTRY)
BILIRUBIN UA: NEGATIVE mg/dL
Bilirubin, UA: NEGATIVE
Glucose, UA: NEGATIVE mg/dL
LEUKOCYTES UA: NEGATIVE
NITRITE UA: NEGATIVE
PH UA: 6 (ref 5.0–8.0)
PROTEIN UA: NEGATIVE mg/dL
Spec Grav, UA: 1.015 (ref 1.010–1.025)
Urobilinogen, UA: 0.2 E.U./dL

## 2017-05-03 MED ORDER — LEVOTHYROXINE SODIUM 112 MCG PO TABS: ORAL_TABLET | ORAL | 3 refills | Status: DC

## 2017-05-03 MED ORDER — PANTOPRAZOLE SODIUM 40 MG PO TBEC: 40.0000 mg | DELAYED_RELEASE_TABLET | Freq: Every day | ORAL | 3 refills | Status: DC

## 2017-05-03 MED ORDER — VITAMIN D (ERGOCALCIFEROL) 1.25 MG (50000 UNIT) PO CAPS: 50000.0000 [IU] | ORAL_CAPSULE | ORAL | 1 refills | Status: DC

## 2017-05-03 MED ORDER — TRIAMCINOLONE ACETONIDE 0.1 % EX CREA: 1.0000 "application " | TOPICAL_CREAM | Freq: Two times a day (BID) | CUTANEOUS | 0 refills | Status: DC

## 2017-05-03 MED ORDER — ALBUTEROL SULFATE HFA 108 (90 BASE) MCG/ACT IN AERS: 2.0000 | INHALATION_SPRAY | Freq: Four times a day (QID) | RESPIRATORY_TRACT | 1 refills | Status: DC | PRN

## 2017-05-03 NOTE — Progress Notes (Signed)
Subjective:    Patient ID: Jane [name], female    DOB: 1961-03-24, 56 y.o.   MRN: [id number]  05/03/2017  Rash (On right buttocks, x 3-4 days, says it's itching and tender to touch ) and Medication Refill (synthroid )   HPI This 56 y.o. female presents for evaluation of hypothyroidism and rash.  Patient reports good compliance with medication, good tolerance to medication, and good symptom control.  Feels well with current dose of thyroid supplementation; does admit to hypersomnolence lately.  Often forgets weekly vitamin D rx.  Requesting rx for Vitamin D.    Rash R buttocks: admits to multiple tick bites this season; has localized area of itching and burning R buttocks. No associated numbness or tingling or pain.  Total knee replacement: s/p TKR and doing well; took three months off of work; now walking mail route again; feels great; pain is much improved.  Fibromyalgia: doing well.  Has not followed up with rheumatologist or pain management lately. Feels well.  Syncopal event: occurred after knee surgery. Occurred four days after TKR.    Tick bites: multiple tick bites.    GERD: doing better with current rx; needs refills.    BP Readings from Last 3 Encounters:  05/03/17 137/80  10/30/16 135/64  10/27/16 (!) 121/45   Wt Readings from Last 3 Encounters:  05/03/17 211 lb 3.2 oz (95.8 kg)  10/30/16 205 lb (93 kg)  10/25/16 205 lb (93 kg)   Immunization History  Administered Date(s) Administered  . Influenza Whole 08/04/2009  . Influenza,inj,Quad PF,36+ Mos 08/27/2013, 07/27/2014, 08/23/2016  . Influenza-Unspecified 09/04/2015  . Tdap 11/09/2014    Review of Systems  Constitutional: Negative for chills, diaphoresis, fatigue and fever.  HENT: Negative for congestion, ear pain, rhinorrhea, sinus pain, sinus pressure and sore throat.   Eyes: Negative for visual disturbance.  Respiratory: Negative for cough and shortness of breath.   Cardiovascular: Negative for chest  pain, palpitations and leg swelling.  Gastrointestinal: Negative for abdominal pain, constipation, diarrhea, nausea and vomiting.  Endocrine: Negative for cold intolerance, heat intolerance, polydipsia, polyphagia and polyuria.  Musculoskeletal: Positive for arthralgias and myalgias.  Skin: Positive for color change and rash.  Neurological: Negative for dizziness, tremors, seizures, syncope, facial asymmetry, speech difficulty, weakness, light-headedness, numbness and headaches.  Psychiatric/Behavioral: Negative for dysphoric mood. The patient is not nervous/anxious.     Past Medical History:  Diagnosis Date  . Arthritis    Knees, feet, elbows , neck  . Asthma    mild; assoc with seasonal allergies; uses Albuterol once per year on average.  Marland Kitchen Atypical chest pain    hx   . Complication of anesthesia    nausea, last 2 surgeries nausea much less  . Fibromyalgia 12/04/2010   [facility] in [place]; Dr. Gwenevere Ghazi; followed every six months.  Marland Kitchen GERD (gastroesophageal reflux disease)   . Heart murmur    Mitral valve Prolapse  . HLD (hyperlipidemia)   . Hypothyroidism post radioactive iodine   Hyperthyroidism; now secondary hypothyroidism  . MR (mitral regurgitation)    moderate; probably MVP. echo 3/10 at [facility] cardiology with normal EF, moderate MR    Past Surgical History:  Procedure Laterality Date  . COLONOSCOPY  12/04/2009   normal; Iftikhar  . inner thigh surgery    . JOINT REPLACEMENT    . KNEE ARTHROPLASTY Right 10/25/2016   Procedure: COMPUTER ASSISTED TOTAL KNEE ARTHROPLASTY;  Surgeon: Donato Heinz, MD;  Location: [facility] ORS;  Service: Orthopedics;  Laterality: Right;  . KNEE SURGERY  7/10  . 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 left foot surgery     for plantar fascitis  . novascue uterine ablation  2007  . REPLACEMENT TOTAL KNEE Left    Allergies  Allergen Reactions  . Celebrex [Celecoxib] Swelling and Shortness Of Breath    Swelling. Developed  Facial swelling     Social History   Social History  . Marital status: Married    Spouse name: Charletta Cousin  . Number of children: 0  . 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 Smoking status: Former Smoker    Types: Cigarettes    Quit date: 10/12/1994  . Smokeless tobacco: Never Used     Comment: smoked 2ppd for 15 years; quit in 1995   . Alcohol use 0.0 oz/week     Comment: social, rare  . Drug use: No  . Sexual activity: Yes    Birth control/ protection: Post-menopausal   Other Topics Concern  . Not on file   Social History Narrative   Marital status:  Married x 21 years; happily married no abuse, no children.      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      Tobacco: none      Alcohol: none      Drugs: none       Exercise:  Does not get regular exercise.       Seatbelt: 100%      Guns:  Unloaded.  Has concealed carry.   Family History  Problem Relation Age of Onset  . Stroke Father 75       CVA  . COPD Father   . Hyperlipidemia Mother   . Heart disease Mother 24       CABG in 13s; AMI age 40  . Hypertension Mother   . Hypertension Sister        Objective:    BP 137/80   Pulse 69   Temp 98.8 F (37.1 C) (Oral)   Resp 16   Ht 5\' 7"  (1.702 m)   Wt 211 lb 3.2 oz (95.8 kg)   SpO2 98%   BMI 33.08 kg/m  Physical Exam  Constitutional: She is oriented to person, place, and time. She appears well-developed and well-nourished. No distress.  HENT:  Head: Normocephalic and atraumatic.  Right Ear: External ear normal.  Left Ear: External ear normal.  Nose: Nose normal.  Mouth/Throat: Oropharynx is clear and moist.  Eyes: Conjunctivae and EOM are normal. Pupils are equal, round, and reactive to light.  Neck: Normal range of motion. Neck supple. Carotid bruit is not present. No thyromegaly present.  Cardiovascular: Normal rate, regular rhythm,  normal heart sounds and intact distal pulses.  Exam reveals no gallop and no friction rub.   No murmur heard. Pulmonary/Chest: Effort normal and breath sounds normal. She has no wheezes. She has no rales.  Abdominal: Soft. Bowel sounds are normal. She exhibits no distension and no mass. There is no tenderness. There is no rebound and no guarding.  Lymphadenopathy:    She has no cervical adenopathy.  Neurological: She is alert and oriented to person, place, and time. No cranial nerve deficit.  Skin: Skin is warm and dry. Rash noted. She is not diaphoretic. No erythema. No pallor.  3 cm x 3 cm area of erythema without vesicles or pustules.  Psychiatric: She has a normal mood and affect. Her behavior is normal.   Results for orders placed or performed in visit on 05/03/17  POCT urinalysis dipstick  Result Value Ref Range   Color, UA yellow yellow   Clarity, UA clear clear   Glucose, UA negative negative mg/dL   Bilirubin, UA negative negative   Ketones, POC UA negative negative mg/dL   Spec Grav, UA 2.952 8.413 - 1.025   Blood, UA trace-lysed (A) negative   pH, UA 6.0 5.0 - 8.0   Protein Ur, POC negative negative mg/dL   Urobilinogen, UA 0.2 0.2 or 1.0 E.U./dL   Nitrite, UA Negative Negative   Leukocytes, UA Negative Negative   Depression screen [facility] 2/9 05/03/2017 08/23/2016 01/01/2016 11/09/2014 07/27/2014  Decreased Interest 0 0 0 0 0  Down, Depressed, Hopeless 0 0 0 0 3  PHQ - 2 Score 0 0 0 0 3  Altered sleeping - - - - 0  Tired, decreased energy - - - - 3  Change in appetite - - - - 2  Feeling bad or failure about yourself  - - - - 1  Trouble concentrating - - - - 0  Moving slowly or fidgety/restless - - - - 0  Suicidal thoughts - - - - 0  PHQ-9 Score - - - - 9       Assessment & Plan:   1. Hypothyroidism following radioiodine therapy   2. Glucose intolerance (impaired glucose tolerance)   3. Seasonal allergic rhinitis due to pollen   4. Mild intermittent asthma  without complication   5. Primary osteoarthritis of both knees   6. Fibromyalgia   7. Status post total right knee replacement   8. Pure hypercholesterolemia   9. Encounter for screening mammogram for breast cancer   10. Class 1 obesity due to excess calories with serious comorbidity and body mass index (BMI) of 33.0 to 33.9 in adult    -stable chronic medical conditions; obtain labs; refills provided. -s/p TKR since last visit and doing well. -GERD has improved from last visit.  Refills provided. -refer for mammogram and contact information provided to schedule appointment. -localized reaction to recent tick bite on R buttocks; no suggestion of secondary infection;discussed s/s of RMSF and Lyme's disease; no indication for abx.  RTC for fever or malaise, etc.  Rx fro Triamcinolone provided to apply tid. -recommend weight loss, exercise, and low-caloric food intake.     Orders Placed This Encounter  Procedures  . MM SCREENING BREAST TOMO BILATERAL    Standing Status:   Future    Standing Expiration Date:   07/04/2018    Order Specific Question:   Reason for Exam (SYMPTOM  OR DIAGNOSIS REQUIRED)    Answer:   annual screening    Order Specific Question:   Is the patient pregnant?    Answer:   No    Order Specific Question:   Preferred imaging location?    Answer:   [facility] [place]  . CBC with Differential/Platelet  . Comprehensive metabolic panel    Order Specific Question:   Has the patient fasted?    Answer:   Yes  . Hemoglobin A1c  . TSH  . T4, free  . Vitamin B12  . VITAMIN D 25 Hydroxy (Vit-D Deficiency, Fractures)  . Lipid panel    Order Specific Question:   Has the patient fasted?    Answer:  Yes  . POCT urinalysis dipstick   Meds ordered this encounter  Medications  . levothyroxine (SYNTHROID, LEVOTHROID) 112 MCG tablet    Sig: TAKE 1 TABLET EVERY DAY 30 MINUTES BEFORE BREAKFAST    Dispense:  90 tablet    Refill:  3  . albuterol (PROVENTIL HFA;VENTOLIN HFA) 108 (90  Base) MCG/ACT inhaler    Sig: Inhale 2 puffs into the lungs every 6 (six) hours as needed for wheezing or shortness of breath (cough, shortness of breath or wheezing.).    Dispense:  1 Inhaler    Refill:  1    PLEASE USE ALBUTEROL HFA PREFERRED BY INSURANCE.  . pantoprazole (PROTONIX) 40 MG tablet    Sig: Take 1 tablet (40 mg total) by mouth daily.    Dispense:  90 tablet    Refill:  3  . Vitamin D, Ergocalciferol, (DRISDOL) 50000 units CAPS capsule    Sig: Take 1 capsule (50,000 Units total) by mouth every 7 (seven) days.    Dispense:  12 capsule    Refill:  1  . triamcinolone cream (KENALOG) 0.1 %    Sig: Apply 1 application topically 2 (two) times daily.    Dispense:  30 g    Refill:  0    Return in about 6 months (around 11/02/2017) for complete physical examiniation.   Joycelin Radloff Paulita FujitaMartin Karsyn Rochin, M.D. Primary [facility] at [facility]  [place] [facility] [address] [place], [place]  [id number] [phone] phone [phone] fax

## 2017-05-03 NOTE — Patient Instructions (Addendum)
[email or web address]   IF you received an x-ray today, you will receive an invoice from [facility]. Please contact [facility] at [phone] with questions or concerns regarding your invoice.   IF you received labwork today, you will receive an invoice from [facility]. Please contact [facility] at [phone] with questions or concerns regarding your invoice.   Our billing staff will not be able to assist you with questions regarding bills from these companies.  You will be contacted with the lab results as soon as they are available. The fastest way to get your results is to activate your My Chart account. Instructions are located on the last page of this paperwork. If you have not heard from [place] regarding the results in 2 weeks, please contact this office.      Calorie Counting for Weight Loss Calories are units of energy. Your body needs a certain amount of calories from food to keep you going throughout the day. When you eat more calories than your body needs, your body stores the extra calories as fat. When you eat fewer calories than your body needs, your body burns fat to get the energy it needs. Calorie counting means keeping track of how many calories you eat and drink each day. Calorie counting can be helpful if you need to lose weight. If you make sure to eat fewer calories than your body needs, you should lose weight. Ask your health care provider what a healthy weight is for you. For calorie counting to work, you will need to eat the right number of calories in a day in order to lose a healthy amount of weight per week. A dietitian can help you determine how many calories you need in a day and will give you suggestions on how to reach your calorie goal.  A healthy amount of weight to lose per week is usually 1-2 lb (0.5-0.9 kg). This usually means that your daily calorie intake should be reduced by 500-750 calories.  Eating 1,200 - 1,500 calories per day can help most  women lose weight.  Eating 1,500 - 1,800 calories per day can help most men lose weight.  What is my plan? My goal is to have __________ calories per day. If I have this many calories per day, I should lose around __________ pounds per week. What do I need to know about calorie counting? In order to meet your daily calorie goal, you will need to:  Find out how many calories are in each food you would like to eat. Try to do this before you eat.  Decide how much of the food you plan to eat.  Write down what you ate and how many calories it had. Doing this is called keeping a food log.  To successfully lose weight, it is important to balance calorie counting with a healthy lifestyle that includes regular activity. Aim for 150 minutes of moderate exercise (such as walking) or 75 minutes of vigorous exercise (such as running) each week. Where do I find calorie information?  The number of calories in a food can be found on a Nutrition Facts label. If a food does not have a Nutrition Facts label, try to look up the calories online or ask your dietitian for help. Remember that calories are listed per serving. If you choose to have more than one serving of a food, you will have to multiply the calories per serving by the amount of servings you plan to eat. For example, the label on a  package of bread might say that a serving size is 1 slice and that there are 90 calories in a serving. If you eat 1 slice, you will have eaten 90 calories. If you eat 2 slices, you will have eaten 180 calories. How do I keep a food log? Immediately after each meal, record the following information in your food log:  What you ate. Don't forget to include toppings, sauces, and other extras on the food.  How much you ate. This can be measured in cups, ounces, or number of items.  How many calories each food and drink had.  The total number of calories in the meal.  Keep your food log near you, such as in a small  notebook in your pocket, or use a mobile app or website. Some programs will calculate calories for you and show you how many calories you have left for the day to meet your goal. What are some calorie counting tips?  Use your calories on foods and drinks that will fill you up and not leave you hungry: ? Some examples of foods that fill you up are nuts and nut butters, vegetables, lean proteins, and high-fiber foods like whole grains. High-fiber foods are foods with more than 5 g fiber per serving. ? Drinks such as sodas, specialty coffee drinks, alcohol, and juices have a lot of calories, yet do not fill you up.  Eat nutritious foods and avoid empty calories. Empty calories are calories you get from foods or beverages that do not have many vitamins or protein, such as candy, sweets, and soda. It is better to have a nutritious high-calorie food (such as an avocado) than a food with few nutrients (such as a bag of chips).  Know how many calories are in the foods you eat most often. This will help you calculate calorie counts faster.  Pay attention to calories in drinks. Low-calorie drinks include water and unsweetened drinks.  Pay attention to nutrition labels for "low fat" or "fat free" foods. These foods sometimes have the same amount of calories or more calories than the full fat versions. They also often have added sugar, starch, or salt, to make up for flavor that was removed with the fat.  Find a way of tracking calories that works for you. Get creative. Try different apps or programs if writing down calories does not work for you. What are some portion control tips?  Know how many calories are in a serving. This will help you know how many servings of a certain food you can have.  Use a measuring cup to measure serving sizes. You could also try weighing out portions on a kitchen scale. With time, you will be able to estimate serving sizes for some foods.  Take some time to put servings of  different foods on your favorite plates, bowls, and cups so you know what a serving looks like.  Try not to eat straight from a bag or box. Doing this can lead to overeating. Put the amount you would like to eat in a cup or on a plate to make sure you are eating the right portion.  Use smaller plates, glasses, and bowls to prevent overeating.  Try not to multitask (for example, watch TV or use your computer) while eating. If it is time to eat, sit down at a table and enjoy your food. This will help you to know when you are full. It will also help you to be aware of what you  are eating and how much you are eating. What are tips for following this plan? Reading food labels  Check the calorie count compared to the serving size. The serving size may be smaller than what you are used to eating.  Check the source of the calories. Make sure the food you are eating is high in vitamins and protein and low in saturated and trans fats. Shopping  Read nutrition labels while you shop. This will help you make healthy decisions before you decide to purchase your food.  Make a grocery list and stick to it. Cooking  Try to cook your favorite foods in a healthier way. For example, try baking instead of frying.  Use low-fat dairy products. Meal planning  Use more fruits and vegetables. Half of your plate should be fruits and vegetables.  Include lean proteins like poultry and fish. How do I count calories when eating out?  Ask for smaller portion sizes.  Consider sharing an entree and sides instead of getting your own entree.  If you get your own entree, eat only half. Ask for a box at the beginning of your meal and put the rest of your entree in it so you are not tempted to eat it.  If calories are listed on the menu, choose the lower calorie options.  Choose dishes that include vegetables, fruits, whole grains, low-fat dairy products, and lean protein.  Choose items that are boiled, broiled,  grilled, or steamed. Stay away from items that are buttered, battered, fried, or served with cream sauce. Items labeled "crispy" are usually fried, unless stated otherwise.  Choose water, low-fat milk, unsweetened iced tea, or other drinks without added sugar. If you want an alcoholic beverage, choose a lower calorie option such as a glass of wine or light beer.  Ask for dressings, sauces, and syrups on the side. These are usually high in calories, so you should limit the amount you eat.  If you want a salad, choose a garden salad and ask for grilled meats. Avoid extra toppings like bacon, cheese, or fried items. Ask for the dressing on the side, or ask for olive oil and vinegar or lemon to use as dressing.  Estimate how many servings of a food you are given. For example, a serving of cooked rice is  cup or about the size of half a baseball. Knowing serving sizes will help you be aware of how much food you are eating at restaurants. The list below tells you how big or small some common portion sizes are based on everyday objects: ? 1 oz-4 stacked dice. ? 3 oz-1 deck of cards. ? 1 tsp-1 die. ? 1 Tbsp- a ping-pong ball. ? 2 Tbsp-1 ping-pong ball. ?  cup- baseball. ? 1 cup-1 baseball. Summary  Calorie counting means keeping track of how many calories you eat and drink each day. If you eat fewer calories than your body needs, you should lose weight.  A healthy amount of weight to lose per week is usually 1-2 lb (0.5-0.9 kg). This usually means reducing your daily calorie intake by 500-750 calories.  The number of calories in a food can be found on a Nutrition Facts label. If a food does not have a Nutrition Facts label, try to look up the calories online or ask your dietitian for help.  Use your calories on foods and drinks that will fill you up, and not on foods and drinks that will leave you hungry.  Use smaller plates, glasses, and  bowls to prevent overeating. This information is not  intended to replace advice given to you by your health care provider. Make sure you discuss any questions you have with your health care provider. Document Released: 11/20/2005 Document Revised: 10/20/2016 Document Reviewed: 10/20/2016 Elsevier Interactive Patient Education  2017 [employer].   We recommend that you schedule a mammogram for breast cancer screening. Typically, you do not need a referral to do this. Please contact a local imaging center to schedule your mammogram.  [facility] - [phone]  *ask for the Radiology Department [facility]) - [phone] or [phone]  [facility] - [phone] [facility] - [phone] [facility] - [phone]  *ask for the Radiology Department [facility] - [phone]  *ask for the Radiology Department [facility] - [phone]  *ask for the Mammography Department [facility] - [phone]

## 2017-05-04 LAB — COMPREHENSIVE METABOLIC PANEL
A/G RATIO: 1.9 (ref 1.2–2.2)
ALT: 20 IU/L (ref 0–32)
AST: 20 IU/L (ref 0–40)
Albumin: 4.7 g/dL (ref 3.5–5.5)
Alkaline Phosphatase: 87 IU/L (ref 39–117)
BUN/Creatinine Ratio: 26 — ABNORMAL HIGH (ref 9–23)
BUN: 17 mg/dL (ref 6–24)
Bilirubin Total: <0.2 mg/dL (ref 0.0–1.2)
CO2: 24 mmol/L (ref 18–29)
Calcium: 10.1 mg/dL (ref 8.7–10.2)
Chloride: 101 mmol/L (ref 96–106)
Creatinine, Ser: 0.65 mg/dL (ref 0.57–1.00)
GFR, EST AFRICAN AMERICAN: 115 mL/min/{1.73_m2} (ref >59)
GFR, EST NON AFRICAN AMERICAN: 100 mL/min/{1.73_m2} (ref >59)
GLOBULIN, TOTAL: 2.5 g/dL (ref 1.5–4.5)
Glucose: 90 mg/dL (ref 65–99)
POTASSIUM: 4.7 mmol/L (ref 3.5–5.2)
SODIUM: 139 mmol/L (ref 134–144)
TOTAL PROTEIN: 7.2 g/dL (ref 6.0–8.5)

## 2017-05-04 LAB — CBC WITH DIFFERENTIAL/PLATELET
BASOS: 1 %
Basophils Absolute: 0 10*3/uL (ref 0.0–0.2)
EOS (ABSOLUTE): 0.2 10*3/uL (ref 0.0–0.4)
EOS: 4 %
HEMATOCRIT: 42.9 % (ref 34.0–46.6)
Hemoglobin: 14.1 g/dL (ref 11.1–15.9)
IMMATURE GRANS (ABS): 0 10*3/uL (ref 0.0–0.1)
Immature Granulocytes: 0 %
LYMPHS: 18 %
Lymphocytes Absolute: 1 10*3/uL (ref 0.7–3.1)
MCH: 28.5 pg (ref 26.6–33.0)
MCHC: 32.9 g/dL (ref 31.5–35.7)
MCV: 87 fL (ref 79–97)
Monocytes Absolute: 0.4 10*3/uL (ref 0.1–0.9)
Monocytes: 8 %
Neutrophils Absolute: 3.9 10*3/uL (ref 1.4–7.0)
Neutrophils: 69 %
Platelets: 263 10*3/uL (ref 150–379)
RBC: 4.95 x10E6/uL (ref 3.77–5.28)
RDW: 14.2 % (ref 12.3–15.4)
WBC: 5.6 10*3/uL (ref 3.4–10.8)

## 2017-05-04 LAB — T4, FREE: Free T4: 1.42 ng/dL (ref 0.82–1.77)

## 2017-05-04 LAB — LIPID PANEL
Chol/HDL Ratio: 4.4 ratio (ref 0.0–4.4)
Cholesterol, Total: 197 mg/dL (ref 100–199)
HDL: 45 mg/dL (ref >39)
LDL Calculated: 121 mg/dL — ABNORMAL HIGH (ref 0–99)
Triglycerides: 156 mg/dL — ABNORMAL HIGH (ref 0–149)
VLDL Cholesterol Cal: 31 mg/dL (ref 5–40)

## 2017-05-04 LAB — VITAMIN B12: Vitamin B-12: 562 pg/mL (ref 232–1245)

## 2017-05-04 LAB — HEMOGLOBIN A1C
Est. average glucose Bld gHb Est-mCnc: 117 mg/dL
Hgb A1c MFr Bld: 5.7 % — ABNORMAL HIGH (ref 4.8–5.6)

## 2017-05-04 LAB — VITAMIN D 25 HYDROXY (VIT D DEFICIENCY, FRACTURES): VIT D 25 HYDROXY: 28.3 ng/mL — AB (ref 30.0–100.0)

## 2017-05-04 LAB — TSH: TSH: 1.15 u[IU]/mL (ref 0.450–4.500)

## 2017-05-07 DIAGNOSIS — M9903 Segmental and somatic dysfunction of lumbar region: Secondary | ICD-10-CM | POA: Diagnosis not present

## 2017-05-07 DIAGNOSIS — M5387 Other specified dorsopathies, lumbosacral region: Secondary | ICD-10-CM | POA: Diagnosis not present

## 2017-05-07 DIAGNOSIS — M5441 Lumbago with sciatica, right side: Secondary | ICD-10-CM | POA: Diagnosis not present

## 2017-05-09 ENCOUNTER — Telehealth: Payer: Self-pay | Admitting: Family Medicine

## 2017-05-09 NOTE — Telephone Encounter (Signed)
Pt is calling about her lab results and also to let Dr. Katrinka BlazingSmith to know that the cream is not working and that it seems the rash has changed shape   Best number [phone]

## 2017-05-10 NOTE — Telephone Encounter (Signed)
Please see abnormal results and advise 

## 2017-05-10 NOTE — Telephone Encounter (Addendum)
PATIENT CALLED BACK TO CHECK ON THE STATUS OF HER PHONE MESSAGE FROM [facility] YESTERDAY (05/09/17). THE PHONE NUMBER SHOULD BE: [phone] (CELL) AND HER HOME PHONE IS [phone]  [facility]

## 2017-05-11 MED ORDER — PREDNISONE 20 MG PO TABS: ORAL_TABLET | ORAL | 0 refills | Status: DC

## 2017-05-11 MED ORDER — NYSTATIN 100000 UNIT/GM EX CREA
1.0000 | TOPICAL_CREAM | Freq: Two times a day (BID) | CUTANEOUS | 0 refills | Status: DC
Start: 2017-05-11 — End: 2018-03-07

## 2017-05-11 NOTE — Telephone Encounter (Signed)
No new soap, detergent or anything. Would like to new cream and oral prednisone sent to pharmacy

## 2017-05-11 NOTE — Telephone Encounter (Signed)
Rash is now on both butt cheeks.  Very red "like an iron touched it"  No open areas or bumps, very itchy.  Using cream and zyrtec w/o relief

## 2017-05-11 NOTE — Telephone Encounter (Signed)
Rx for Prednisone and Nystatin sent to local pharmacy.

## 2017-05-21 DIAGNOSIS — K080 Exfoliation of teeth due to systemic causes: Secondary | ICD-10-CM | POA: Diagnosis not present

## 2017-05-23 ENCOUNTER — Telehealth: Payer: Self-pay | Admitting: Family Medicine

## 2017-05-23 NOTE — Telephone Encounter (Signed)
Pt is looking for lab results   Best number [phone]

## 2017-05-24 NOTE — Telephone Encounter (Signed)
Could you please release lab results? Thanks

## 2017-05-28 ENCOUNTER — Encounter: Payer: Self-pay | Admitting: Family Medicine

## 2017-05-28 NOTE — Telephone Encounter (Signed)
Labs reviewed and released; please call pt with results.

## 2017-05-29 NOTE — Progress Notes (Signed)
Letter sent.

## 2017-05-29 NOTE — Telephone Encounter (Signed)
Noreene LarssonJill left a message to call back.

## 2017-05-30 ENCOUNTER — Encounter: Payer: Self-pay | Admitting: Radiology

## 2017-06-05 NOTE — Telephone Encounter (Signed)
Spoke with pt. Results mailed to pt.

## 2017-06-12 ENCOUNTER — Ambulatory Visit
Admission: RE | Admit: 2017-06-12 | Discharge: 2017-06-12 | Disposition: A | Discharge status: Home / Self Care | Payer: Federal, State, Local not specified - PPO | Source: Ambulatory Visit | Attending: Family Medicine | Admitting: Family Medicine

## 2017-06-12 DIAGNOSIS — Z1231 Encounter for screening mammogram for malignant neoplasm of breast: Secondary | ICD-10-CM | POA: Diagnosis not present

## 2017-07-13 DIAGNOSIS — M1811 Unilateral primary osteoarthritis of first carpometacarpal joint, right hand: Secondary | ICD-10-CM | POA: Diagnosis not present

## 2017-08-09 DIAGNOSIS — M19031 Primary osteoarthritis, right wrist: Secondary | ICD-10-CM | POA: Diagnosis not present

## 2017-08-18 DIAGNOSIS — M19031 Primary osteoarthritis, right wrist: Secondary | ICD-10-CM | POA: Insufficient documentation

## 2017-10-15 ENCOUNTER — Telehealth: Payer: Self-pay | Admitting: Family Medicine

## 2017-10-15 DIAGNOSIS — M19031 Primary osteoarthritis, right wrist: Secondary | ICD-10-CM | POA: Diagnosis not present

## 2017-10-15 DIAGNOSIS — M1811 Unilateral primary osteoarthritis of first carpometacarpal joint, right hand: Secondary | ICD-10-CM | POA: Diagnosis not present

## 2017-10-15 NOTE — Telephone Encounter (Signed)
''  Patient is asking for a referral to a pulmologist/lung doctor because she says she is not getting any better with the breathing. She said she is using the inhaler and its not helping. She said it has gotten worse over the last few days.'' CRM MESSAGE  Please advise

## 2017-10-16 NOTE — Telephone Encounter (Signed)
I have not seen patient in six months; I recommend office visit for acutely worsening symptoms.

## 2017-10-18 NOTE — Telephone Encounter (Signed)
Spoke with pt this morning about referral request and informed her that Dr. Katrinka BlazingSmith would like to evaluate her before she sends a referral. Informed her that since she has not been seen within 6 months that she could all back when she can to schedule appointment.  She verbalized understanding and states she will try to make an appointment next week after she check her work schedule.

## 2017-11-01 DIAGNOSIS — Z96653 Presence of artificial knee joint, bilateral: Secondary | ICD-10-CM | POA: Diagnosis not present

## 2017-11-01 DIAGNOSIS — M170 Bilateral primary osteoarthritis of knee: Secondary | ICD-10-CM | POA: Diagnosis not present

## 2017-11-03 ENCOUNTER — Other Ambulatory Visit: Payer: Self-pay | Admitting: Family Medicine

## 2017-11-05 NOTE — Telephone Encounter (Signed)
Do you want a repeat lab?

## 2017-11-07 ENCOUNTER — Encounter: Payer: Federal, State, Local not specified - PPO | Admitting: Family Medicine

## 2017-11-07 DIAGNOSIS — D225 Melanocytic nevi of trunk: Secondary | ICD-10-CM | POA: Diagnosis not present

## 2018-01-09 ENCOUNTER — Encounter: Payer: Self-pay | Admitting: Podiatry

## 2018-01-09 ENCOUNTER — Ambulatory Visit: Payer: Federal, State, Local not specified - PPO | Admitting: Podiatry

## 2018-01-09 DIAGNOSIS — M7752 Other enthesopathy of left foot: Secondary | ICD-10-CM | POA: Diagnosis not present

## 2018-01-09 NOTE — Progress Notes (Signed)
She presents today for follow-up of pain to her medial foot left.  States that getting this burning sensation medial aspect of the foot.  She points to the distal most aspect of the tibialis anterior tendon.  She states that she feels that it may be coming from her shoes that work will distribute she is not allowed to pick out her own shoes.  Objective: Vital signs are stable she is alert and oriented x3 there is no erythema edema sialitis drainage or odor she has tenderness on palpation of the tibialis anterior tendon at its insertion site with fluctuance in that area.  Assessment: Tibialis anterior tendinitis at its insertion site.  Plan: Discussed etiology pathology conservative versus surgical therapies.  I injected the area today with dexamethasone 2 mg after sterile Betadine skin prep.  Follow-up with her in the next few weeks.  Recommended that she be able to pick out her shoes and wear the correct shoes to work.

## 2018-01-22 ENCOUNTER — Telehealth: Payer: Self-pay | Admitting: Family Medicine

## 2018-01-22 NOTE — Telephone Encounter (Signed)
Copied from CRM [id number]. Topic: Quick Communication - See Telephone Encounter >> Jan 22, 2018  4:03 PM Jane [name], Rosey Batheresa D wrote: CRM for notification. See Telephone encounter for: 01/22/18. Patient called and would [place] to check if she is diabetic. Patient would like to go to [facility] that is [facility] where Dr. Katrinka BlazingSmith used to be at. Please call patient back, thanks.

## 2018-01-23 NOTE — Telephone Encounter (Signed)
Pt calling to see if Dr Katrinka BlazingSmith had checked her message from yesterday please call pt on her home phone because her cell phone isn't working

## 2018-01-27 NOTE — Telephone Encounter (Signed)
Phone message sent to Dr. Smith 

## 2018-01-27 NOTE — Telephone Encounter (Signed)
Call patient for more details ---- we screened patient for diabetes at her last visit in May 2018.  At that time she was in the prediabetic range.  She will be due for repeat labs with physical in upcoming few months.  Is she having certain symptoms that she is worried about being due to diabetes?  If so, I recommend an office visit.

## 2018-01-31 NOTE — Telephone Encounter (Signed)
LMOVM for pt to call office for appt.  And gave Dr. Michaelle CopasSmith's message

## 2018-02-25 DIAGNOSIS — J069 Acute upper respiratory infection, unspecified: Secondary | ICD-10-CM | POA: Diagnosis not present

## 2018-03-05 ENCOUNTER — Other Ambulatory Visit: Payer: Self-pay | Admitting: Family Medicine

## 2018-03-07 ENCOUNTER — Ambulatory Visit: Payer: Federal, State, Local not specified - PPO | Admitting: Physician Assistant

## 2018-03-07 ENCOUNTER — Encounter: Payer: Self-pay | Admitting: Physician Assistant

## 2018-03-07 VITALS — BP 140/80 | HR 66 | Temp 98.1°F | Ht 67.0 in | Wt 210.0 lb

## 2018-03-07 DIAGNOSIS — E890 Postprocedural hypothyroidism: Secondary | ICD-10-CM | POA: Diagnosis not present

## 2018-03-07 DIAGNOSIS — E6609 Other obesity due to excess calories: Secondary | ICD-10-CM | POA: Diagnosis not present

## 2018-03-07 DIAGNOSIS — J4520 Mild intermittent asthma, uncomplicated: Secondary | ICD-10-CM

## 2018-03-07 DIAGNOSIS — E559 Vitamin D deficiency, unspecified: Secondary | ICD-10-CM | POA: Diagnosis not present

## 2018-03-07 DIAGNOSIS — M79672 Pain in left foot: Secondary | ICD-10-CM

## 2018-03-07 DIAGNOSIS — Z6833 Body mass index (BMI) 33.0-33.9, adult: Secondary | ICD-10-CM

## 2018-03-07 DIAGNOSIS — R7309 Other abnormal glucose: Secondary | ICD-10-CM

## 2018-03-07 DIAGNOSIS — E782 Mixed hyperlipidemia: Secondary | ICD-10-CM | POA: Diagnosis not present

## 2018-03-07 DIAGNOSIS — Z7689 Persons encountering health services in other specified circumstances: Secondary | ICD-10-CM | POA: Diagnosis not present

## 2018-03-07 NOTE — Progress Notes (Signed)
Patient: Jane [name], Female    DOB: 1961/05/14, 57 y.o.   MRN: [id number] Visit Date: 03/07/2018  Today's Provider: Margaretann LovelessJennifer M Linzy Darling, PA-C   Chief Complaint  Patient presents with  . New Patient (Initial Visit)  . Hypothyroidism   Subjective:  ----------------------------------------------------------------- Patient here today to establish care, patient transferring from Dr. Nilda SimmerKristi Smith.    Hypothyroid, follow-up:  TSH  Date Value Ref Range Status  05/03/2017 1.150 0.450 - 4.500 uIU/mL Final  01/01/2016 1.063 0.350 - 4.500 uIU/mL Final  11/09/2014 1.142 0.350 - 4.500 uIU/mL Final   Wt Readings from Last 3 Encounters:  03/07/18 210 lb (95.3 kg)  05/03/17 211 lb 3.2 oz (95.8 kg)  10/30/16 205 lb (93 kg)    She was last seen for hypothyroid 11 months ago.  Management since that visit includes check labs. She reports excellent compliance with treatment. She is not having side effects.  She is not exercising. She is experiencing none She denies change in energy level, nervousness and palpitations Weight trend: stable  She is also requesting to have labs drawn. She has been prediabetic in the past and has started to have pain on the top of the left foot that sometimes feels like nerve pain. She is requesting to have labs rechecked for diabetes vs gout. Not having pain currently today.   She does have personal history of MR (asymptomatic and has been evaluated by Cardiology in the past), seasonal allergies with occasional asthmatic flare during her seasons, elevated lipids, vit d def (borderline) and hypothyroidism. Patient had been hypothyroid but transitioned to hyperthyroidism and had to undergo radioiodine therapy. She has been stable on levothyroxine 112mcg for a while now.  ------------------------------------------------------------------------  Review of Systems  Constitutional: Negative.   HENT: Positive for congestion.   Eyes: Negative.     Respiratory: Positive for shortness of breath.   Cardiovascular: Negative.   Gastrointestinal: Negative.   Endocrine: Positive for heat intolerance.  Genitourinary: Negative.   Musculoskeletal: Positive for arthralgias, back pain, joint swelling and myalgias.  Skin: Negative.   Allergic/Immunologic: Negative.   Neurological: Negative.   Hematological: Negative.   Psychiatric/Behavioral: The patient is nervous/anxious.     Social History      She  reports that she quit smoking about 23 years ago. Her smoking use included cigarettes. She has never used smokeless tobacco. She reports that she drinks alcohol. She reports that she does not use drugs.       Social History   Socioeconomic History  . Marital status: Married    Spouse name: Charletta CousinBirch  . Number of children: 0  . Years of education: Not on file  . Highest education level: Not on file  Occupational History  . Occupation: mail carrier  Social Needs  . Financial resource strain: Not on file  . Food insecurity:    Worry: Not on file    Inability: Not on file  . Transportation needs:    Medical: Not on file    Non-medical: Not on file  Tobacco Use  . Smoking status: Former Smoker    Types: Cigarettes    Last attempt to quit: 10/12/1994    Years since quitting: 23.4  . Smokeless tobacco: Never Used  . Tobacco comment: smoked 2ppd for 15 years; quit in 1995   Substance and Sexual Activity  . Alcohol use: Yes    Alcohol/week: 0.0 oz    Comment: social, rare  . Drug use: No  .  Sexual activity: Yes    Birth control/protection: Post-menopausal  Lifestyle  . Physical activity:    Days per week: Not on file    Minutes per session: Not on file  . Stress: Not on file  Relationships  . Social connections:    Talks on phone: Not on file    Gets together: Not on file    Attends religious service: Not on file    Active member of club or organization: Not on file    Attends meetings of clubs or organizations: Not on file     Relationship status: Not on file  Other Topics Concern  . Not on file  Social History Narrative   Marital status:  Married x 21 years; happily married no abuse, no children.      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      Tobacco: none      Alcohol: none      Drugs: none       Exercise:  Does not get regular exercise.       Seatbelt: 100%      Guns:  Unloaded.  Has concealed carry.    Past Medical History:  Diagnosis Date  . Allergy   . Arthritis    Knees, feet, elbows , neck  . Asthma    mild; assoc with seasonal allergies; uses Albuterol once per year on average.  Marland Kitchen Atypical chest pain    hx   . Complication of anesthesia    nausea, last 2 surgeries nausea much less  . Fibromyalgia 12/04/2010   [facility] in [place]; Dr. Gwenevere Ghazi; followed every six months.  . Fibromyalgia   . GERD (gastroesophageal reflux disease)   . Heart murmur    Mitral valve Prolapse  . HLD (hyperlipidemia)   . Hypothyroidism post radioactive iodine   Hyperthyroidism; now secondary hypothyroidism  . MR (mitral regurgitation)    moderate; probably MVP. echo 3/10 at [facility] cardiology with normal EF, moderate MR      Patient Active Problem List   Diagnosis Date Noted  . Localized primary carpometacarpal osteoarthrosis, right 08/18/2017  . 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 Vitamin D deficiency 09/23/2016  . Hyperlipidemia 01/01/2016  . Hypothyroidism due to acquired atrophy of thyroid 01/01/2016  . Glucose intolerance (impaired glucose tolerance) 01/01/2016  . Class 1 obesity due to excess calories with serious comorbidity and body mass index (BMI) of 33.0 to 33.9 in adult 01/01/2016  . Gastroesophageal reflux disease without esophagitis 01/01/2016  . Asthma, mild intermittent 01/01/2016  . Anxiety state 11/09/2014  . Osteoarthritis, knee 10/20/2012  . Fibromyalgia 10/20/2012  . Hypothyroidism  following radioiodine therapy 10/20/2012  . Allergic rhinitis 02/24/2010  . HYPERLIPIDEMIA 12/31/2009  . MURMUR 12/31/2009    Past Surgical History:  Procedure Laterality Date  . COLONOSCOPY  12/04/2009   normal; [facility]  . inner thigh surgery    . JOINT REPLACEMENT    . KNEE ARTHROPLASTY Right 10/25/2016   Procedure: COMPUTER ASSISTED TOTAL KNEE ARTHROPLASTY;  Surgeon: Donato Heinz, MD;  Location: [facility] ORS;  Service: Orthopedics;  Laterality: Right;  . KNEE SURGERY  7/10  . 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 left foot surgery     for plantar fascitis  . novascue uterine ablation  2007  . REPLACEMENT TOTAL KNEE Left     Family History  Family Status  Relation Name Status  . Father  Deceased at age 67       pneumonia; CVA, COPD  . Mother  Deceased at age 68       CABG, MI (first at 2), HTN  . Sister  Alive       x2. HTN, smokers, overweight.   . Sister  Alive  . Emelda Brothers  Alive        Her family history includes Breast cancer (age of onset: 43) in her paternal aunt; COPD in her father; Heart disease (age of onset: 95) in her mother; Hyperlipidemia in her mother; Hypertension in her mother and sister; Stroke (age of onset: 24) in her father.      Allergies  Allergen Reactions  . Celebrex [Celecoxib] Swelling and Shortness Of Breath    Swelling. Developed Facial swelling      Current Outpatient Medications:  .  levothyroxine (SYNTHROID, LEVOTHROID) 112 MCG tablet, TAKE 1 TABLET EVERY DAY 30 MINUTES BEFORE BREAKFAST, Disp: 90 tablet, Rfl: 3 .  meloxicam (MOBIC) 15 MG tablet, Take 15 mg by mouth daily. , Disp: , Rfl:  .  pantoprazole (PROTONIX) 40 MG tablet, Take 40 mg by mouth daily., Disp: , Rfl:  .  traMADol (ULTRAM) 50 MG tablet, Take 50 mg by mouth every 12 (twelve) hours as needed. , Disp: , Rfl:  .  Vitamin D, Ergocalciferol, (DRISDOL) 50000 units CAPS capsule, Take 50,000 Units by mouth every 7 (seven) days., Disp: , Rfl:  .  albuterol (PROVENTIL  HFA;VENTOLIN HFA) 108 (90 Base) MCG/ACT inhaler, Inhale 2 puffs into the lungs every 6 (six) hours as needed for wheezing or shortness of breath (cough, shortness of breath or wheezing.). (Patient not taking: Reported on 03/07/2018), Disp: 1 Inhaler, Rfl: 1   Patient Care Team: [name], PA-C as PCP - General (Family Medicine)      Objective:   Vitals: BP 140/80 (BP Location: Left Arm, Patient Position: Sitting, Cuff Size: Large)   Pulse 66   Temp 98.1 F (36.7 C) (Oral)   Ht 5\' 7"  (1.702 m)   Wt 210 lb (95.3 kg)   SpO2 99%   BMI 32.89 kg/m    Vitals:   03/07/18 1520  BP: 140/80  Pulse: 66  Temp: 98.1 F (36.7 C)  TempSrc: Oral  SpO2: 99%  Weight: 210 lb (95.3 kg)  Height: 5\' 7"  (1.702 m)     Physical Exam  Constitutional: She is oriented to person, place, and time. She appears well-developed and well-nourished. No distress.  Neck: Normal range of motion. Neck supple. No tracheal deviation present. No thyromegaly present.  Cardiovascular: Normal rate and regular rhythm. Exam reveals no gallop and no friction rub.  Murmur heard. Pulmonary/Chest: Effort normal and breath sounds normal. No respiratory distress. She has no wheezes. She has no rales.  Musculoskeletal: Normal range of motion. She exhibits no edema.  Lymphadenopathy:    She has no cervical adenopathy.  Neurological: She is alert and oriented to person, place, and time.  Skin: She is not diaphoretic.  Psychiatric: She has a normal mood and affect. Her behavior is normal. Judgment and thought content normal.  Vitals reviewed.    Depression Screen PHQ 2/9 Scores 03/07/2018 05/03/2017 08/23/2016 01/01/2016  PHQ - 2 Score 2 0 0 0  PHQ- 9 Score 8 - - -      Assessment & Plan:     Routine Health Maintenance and Physical Exam  Exercise Activities and Dietary  recommendations Goals    None      Immunization History  Administered Date(s) Administered  . Influenza Whole 08/04/2009  .  Influenza,inj,Quad PF,6+ Mos 08/27/2013, 07/27/2014, 08/23/2016, 08/14/2017  . Influenza-Unspecified 09/04/2015  . Tdap 11/09/2014    Health Maintenance  Topic Date Due  . INFLUENZA VACCINE  07/04/2018  . PAP SMEAR  12/31/2018  . MAMMOGRAM  06/13/2019  . COLONOSCOPY  12/05/2023  . TETANUS/TDAP  11/09/2024  . Hepatitis C Screening  Completed  . HIV Screening  Completed     Discussed health benefits of physical activity, and encouraged her to engage in regular exercise appropriate for her age and condition.    1. Encounter to establish care Establishing from [facility], Dr. Kevin Fenton. Notes reviewed in Epic. CPE is due next month. Patient will call to schedule.   2. Mild intermittent asthma without complication Stable on albuterol prn.   3. Hypothyroidism following radioiodine therapy Current dose of levothyroxine is . Will check labs as below and f/u pending results. - TSH  4. Mixed hyperlipidemia Diet controlled. Will check labs as below and f/u pending results. - Lipid Profile - CBC w/Diff/Platelet - Comprehensive Metabolic Panel (CMET)  5. Class 1 obesity due to excess calories with serious comorbidity and body mass index (BMI) of 33.0 to 33.9 in adult Counseled patient on healthy lifestyle modifications including dieting and exercise.  Will check labs as below and f/u pending results. - HgB A1c  6. Vitamin D deficiency On 800IU daily. Will check labs as below and f/u pending results. - Vitamin D (25 hydroxy) - CBC w/Diff/Platelet - Comprehensive Metabolic Panel (CMET)  7. Elevated hemoglobin A1c Was previously 5.7. Diet controlled. Will check labs as below and f/u pending results.  - HgB A1c - CBC w/Diff/Platelet - Comprehensive Metabolic Panel (CMET)  8. Left foot pain New onset over last month. Does have h/o fracture in that foot many years ago. Had to wear work specific shoes, pain started, now back in regular tennis shoes and pain still persist. Has  seen podiatry, had fluid drawn off and steroid injection without relief. Reports when pain starts it is warm and tender to touch. No redness. Will check uric acid as below to make sure not an atypical gout flare.  - Uric acid  --------------------------------------------------------------------    [name], PA-C  [facility]

## 2018-03-08 ENCOUNTER — Telehealth: Payer: Self-pay

## 2018-03-08 LAB — CBC WITH DIFFERENTIAL/PLATELET
BASOS: 1 %
Basophils Absolute: 0 10*3/uL (ref 0.0–0.2)
EOS (ABSOLUTE): 0.2 10*3/uL (ref 0.0–0.4)
EOS: 3 %
Hematocrit: 39.7 % (ref 34.0–46.6)
Hemoglobin: 13.2 g/dL (ref 11.1–15.9)
Immature Grans (Abs): 0 10*3/uL (ref 0.0–0.1)
Immature Granulocytes: 0 %
LYMPHS ABS: 1.8 10*3/uL (ref 0.7–3.1)
Lymphs: 27 %
MCH: 28 pg (ref 26.6–33.0)
MCHC: 33.2 g/dL (ref 31.5–35.7)
MCV: 84 fL (ref 79–97)
MONOS ABS: 0.4 10*3/uL (ref 0.1–0.9)
Monocytes: 5 %
Neutrophils Absolute: 4.2 10*3/uL (ref 1.4–7.0)
Neutrophils: 64 %
Platelets: 265 10*3/uL (ref 150–379)
RBC: 4.72 x10E6/uL (ref 3.77–5.28)
RDW: 14 % (ref 12.3–15.4)
WBC: 6.6 10*3/uL (ref 3.4–10.8)

## 2018-03-08 LAB — LIPID PANEL
Chol/HDL Ratio: 4.8 ratio — ABNORMAL HIGH (ref 0.0–4.4)
Cholesterol, Total: 201 mg/dL — ABNORMAL HIGH (ref 100–199)
HDL: 42 mg/dL (ref >39)
LDL CALC: 142 mg/dL — AB (ref 0–99)
TRIGLYCERIDES: 84 mg/dL (ref 0–149)
VLDL CHOLESTEROL CAL: 17 mg/dL (ref 5–40)

## 2018-03-08 LAB — COMPREHENSIVE METABOLIC PANEL
A/G RATIO: 1.9 (ref 1.2–2.2)
ALK PHOS: 78 IU/L (ref 39–117)
ALT: 26 IU/L (ref 0–32)
AST: 25 IU/L (ref 0–40)
Albumin: 4.6 g/dL (ref 3.5–5.5)
BUN/Creatinine Ratio: 20 (ref 9–23)
BUN: 16 mg/dL (ref 6–24)
Bilirubin Total: <0.2 mg/dL (ref 0.0–1.2)
CO2: 24 mmol/L (ref 20–29)
Calcium: 9.5 mg/dL (ref 8.7–10.2)
Chloride: 104 mmol/L (ref 96–106)
Creatinine, Ser: 0.82 mg/dL (ref 0.57–1.00)
GFR calc Af Amer: 92 mL/min/{1.73_m2} (ref >59)
GFR calc non Af Amer: 80 mL/min/{1.73_m2} (ref >59)
GLOBULIN, TOTAL: 2.4 g/dL (ref 1.5–4.5)
Glucose: 83 mg/dL (ref 65–99)
POTASSIUM: 4.1 mmol/L (ref 3.5–5.2)
SODIUM: 142 mmol/L (ref 134–144)
Total Protein: 7 g/dL (ref 6.0–8.5)

## 2018-03-08 LAB — HEMOGLOBIN A1C
ESTIMATED AVERAGE GLUCOSE: 126 mg/dL
HEMOGLOBIN A1C: 6 % — AB (ref 4.8–5.6)

## 2018-03-08 LAB — VITAMIN D 25 HYDROXY (VIT D DEFICIENCY, FRACTURES): Vit D, 25-Hydroxy: 42.8 ng/mL (ref 30.0–100.0)

## 2018-03-08 LAB — TSH: TSH: 0.807 u[IU]/mL (ref 0.450–4.500)

## 2018-03-08 LAB — URIC ACID: URIC ACID: 4.3 mg/dL (ref 2.5–7.1)

## 2018-03-08 NOTE — Telephone Encounter (Signed)
-----   Message from Margaretann LovelessJennifer M Burnette, [place]PA-C sent at 03/08/2018 10:57 AM EDT ----- Blood count normal. Kidney and liver function normal. Thyroid normal. Vit D normal range now. Uric acid normal (not gout in foot). Cholesterol up slightly from last year and A1c is up to 6.0 from 5.7. Really focus on healthy dieting habits including limiting carbs, sugars, fatty foods and red meat. Increase physical activity to get in 150 minutes of moderate exercise per week.

## 2018-03-08 NOTE — Telephone Encounter (Signed)
Patient advised as directed below.  Thanks,  -Moraima Burd 

## 2018-03-19 DIAGNOSIS — M5441 Lumbago with sciatica, right side: Secondary | ICD-10-CM | POA: Diagnosis not present

## 2018-03-19 DIAGNOSIS — M5387 Other specified dorsopathies, lumbosacral region: Secondary | ICD-10-CM | POA: Diagnosis not present

## 2018-03-19 DIAGNOSIS — M5442 Lumbago with sciatica, left side: Secondary | ICD-10-CM | POA: Diagnosis not present

## 2018-03-19 DIAGNOSIS — M531 Cervicobrachial syndrome: Secondary | ICD-10-CM | POA: Diagnosis not present

## 2018-04-17 ENCOUNTER — Telehealth: Payer: Self-pay | Admitting: Physician Assistant

## 2018-04-17 NOTE — Telephone Encounter (Signed)
I never speak for any diet in particular. A diet low in carbs and saturated fatty foods can be beneficial and heart healthy. She can look up AHA dieting or diabetic diets. These are healthier. More so she needs to limit portion sizes. Most people just overeat. Even healthy foods in large quantities will cause you to gain weight. Look up [email or web address]. This will educate on portion sizing.

## 2018-04-17 NOTE — Telephone Encounter (Signed)
Pt was in about a month or two ago and seen for the first time and was suggested that she loose weight.  She wants to know if you recommend any type of diet in particular.  She would like some one to call her back with more info on what she should do to loose weight.  Her call back is [phone]

## 2018-04-17 NOTE — Telephone Encounter (Signed)
Informed patient of different eating habits and resources.

## 2018-04-29 ENCOUNTER — Encounter: Payer: Self-pay | Admitting: Family Medicine

## 2018-05-01 ENCOUNTER — Encounter: Payer: Self-pay | Admitting: Physician Assistant

## 2018-05-01 ENCOUNTER — Ambulatory Visit (INDEPENDENT_AMBULATORY_CARE_PROVIDER_SITE_OTHER): Payer: Federal, State, Local not specified - PPO | Admitting: Physician Assistant

## 2018-05-01 VITALS — BP 128/88 | HR 72 | Temp 97.7°F | Resp 16 | Ht 67.0 in | Wt 211.0 lb

## 2018-05-01 DIAGNOSIS — Z6833 Body mass index (BMI) 33.0-33.9, adult: Secondary | ICD-10-CM | POA: Diagnosis not present

## 2018-05-01 DIAGNOSIS — K219 Gastro-esophageal reflux disease without esophagitis: Secondary | ICD-10-CM | POA: Diagnosis not present

## 2018-05-01 DIAGNOSIS — Z0000 Encounter for general adult medical examination without abnormal findings: Secondary | ICD-10-CM | POA: Diagnosis not present

## 2018-05-01 DIAGNOSIS — Z1239 Encounter for other screening for malignant neoplasm of breast: Secondary | ICD-10-CM

## 2018-05-01 DIAGNOSIS — M19031 Primary osteoarthritis, right wrist: Secondary | ICD-10-CM

## 2018-05-01 DIAGNOSIS — E890 Postprocedural hypothyroidism: Secondary | ICD-10-CM

## 2018-05-01 DIAGNOSIS — E6609 Other obesity due to excess calories: Secondary | ICD-10-CM

## 2018-05-01 DIAGNOSIS — Z1231 Encounter for screening mammogram for malignant neoplasm of breast: Secondary | ICD-10-CM

## 2018-05-01 MED ORDER — OMEPRAZOLE 20 MG PO CPDR: 20.0000 mg | DELAYED_RELEASE_CAPSULE | Freq: Two times a day (BID) | ORAL | 1 refills | Status: DC

## 2018-05-01 MED ORDER — TRAMADOL HCL 50 MG PO TABS: 50.0000 mg | ORAL_TABLET | Freq: Two times a day (BID) | ORAL | 1 refills | Status: DC | PRN

## 2018-05-01 MED ORDER — LEVOTHYROXINE SODIUM 112 MCG PO TABS: ORAL_TABLET | ORAL | 3 refills | Status: DC

## 2018-05-01 NOTE — Progress Notes (Signed)
Patient: Jane [name], Female    DOB: August 16, 1961, 57 y.o.   MRN: [id number] Visit Date: 05/01/2018  Today's Provider: [name], PA-C   Chief Complaint  Patient presents with  . Annual Exam   Subjective:    Annual physical exam Jane [name] is a 57 y.o. female who presents today for health maintenance and complete physical. She feels well. She reports exercising none. She reports she is sleeping fairly well.  01/01/16 Pap- normal 06/12/17 Mammogram-BI-RADS 1 -----------------------------------------------------------------   Review of Systems  Constitutional: Negative.   HENT: Positive for sneezing.   Eyes: Positive for itching and visual disturbance.  Respiratory: Negative.   Cardiovascular: Negative.   Gastrointestinal: Negative.   Endocrine: Negative.   Genitourinary: Positive for enuresis and vaginal pain.  Musculoskeletal: Positive for arthralgias, back pain, joint swelling, myalgias, neck pain and neck stiffness.  Skin: Negative.   Allergic/Immunologic: Positive for environmental allergies.  Neurological: Negative.   Hematological: Negative.   Psychiatric/Behavioral: Positive for decreased concentration and sleep disturbance. The patient is nervous/anxious.     Social History      She  reports that she quit smoking about 23 years ago. Her smoking use included cigarettes. She has never used smokeless tobacco. She reports that she drinks alcohol. She reports that she does not use drugs.       Social History   Socioeconomic History  . Marital status: Married    Spouse name: Charletta Cousin  . Number of children: 0  . Years of education: Not on file  . Highest education level: Not on file  Occupational History  . Occupation: mail carrier  Social Needs  . Financial resource strain: Not on file  . Food insecurity:    Worry: Not on file    Inability: Not on file  . Transportation needs:    Medical: Not on file    Non-medical: Not on file    Tobacco Use  . Smoking status: Former Smoker    Types: Cigarettes    Last attempt to quit: 10/12/1994    Years since quitting: 23.5  . Smokeless tobacco: Never Used  . Tobacco comment: smoked 2ppd for 15 years; quit in 1995   Substance and Sexual Activity  . Alcohol use: Yes    Alcohol/week: 0.0 oz    Comment: social, rare  . Drug use: No  . Sexual activity: Yes    Birth control/protection: Post-menopausal  Lifestyle  . Physical activity:    Days per week: Not on file    Minutes per session: Not on file  . Stress: Not on file  Relationships  . Social connections:    Talks on phone: Not on file    Gets together: Not on file    Attends religious service: Not on file    Active member of club or organization: Not on file    Attends meetings of clubs or organizations: Not on file    Relationship status: Not on file  Other Topics Concern  . Not on file  Social History Narrative   Marital status:  Married x 21 years; happily married no abuse, no children.      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      Tobacco: none      Alcohol: none      Drugs: none       Exercise:  Does not get regular exercise.       Seatbelt: 100%      Guns:  Unloaded.  Has concealed carry.    Past Medical History:  Diagnosis Date  . Allergy   . Arthritis    Knees, feet, elbows , neck  . Asthma    mild; assoc with seasonal allergies; uses Albuterol once per year on average.  Marland Kitchen Atypical chest pain    hx   . Complication of anesthesia    nausea, last 2 surgeries nausea much less  . Fibromyalgia 12/04/2010   [facility] in [place]; Dr. Gwenevere Ghazi; followed every six months.  . Fibromyalgia   . GERD (gastroesophageal reflux disease)   . Heart murmur    Mitral valve Prolapse  . HLD (hyperlipidemia)   . Hypothyroidism post radioactive iodine   Hyperthyroidism; now secondary hypothyroidism  . MR (mitral regurgitation)     moderate; probably MVP. echo 3/10 at [facility] cardiology with normal EF, moderate MR      Patient Active Problem List   Diagnosis Date Noted  . Localized primary carpometacarpal osteoarthrosis, right 08/18/2017  . 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 Vitamin D deficiency 09/23/2016  . Hypothyroidism due to acquired atrophy of thyroid 01/01/2016  . Glucose intolerance (impaired glucose tolerance) 01/01/2016  . Class 1 obesity due to excess calories with serious comorbidity and body mass index (BMI) of 33.0 to 33.9 in adult 01/01/2016  . Gastroesophageal reflux disease without esophagitis 01/01/2016  . Asthma, mild intermittent 01/01/2016  . Anxiety state 11/09/2014  . Osteoarthritis, knee 10/20/2012  . Fibromyalgia 10/20/2012  . Hypothyroidism following radioiodine therapy 10/20/2012  . Allergic rhinitis 02/24/2010  . Hyperlipidemia 12/31/2009  . MURMUR 12/31/2009    Past Surgical History:  Procedure Laterality Date  . COLONOSCOPY  12/04/2009   normal; [facility]  . inner thigh surgery    . JOINT REPLACEMENT    . KNEE ARTHROPLASTY Right 10/25/2016   Procedure: COMPUTER ASSISTED TOTAL KNEE ARTHROPLASTY;  Surgeon: Donato Heinz, MD;  Location: [facility] ORS;  Service: Orthopedics;  Laterality: Right;  . KNEE SURGERY  7/10  . 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 left foot surgery     for plantar fascitis  . novascue uterine ablation  2007  . REPLACEMENT TOTAL KNEE Left     Family History        Family Status  Relation Name Status  . Father  Deceased at age 22       pneumonia; CVA, COPD  . Mother  Deceased at age 13       CABG, MI (first at 64), HTN  . Sister  Alive       x2. HTN, smokers, overweight.   . Sister  Alive  . Emelda Brothers  Alive        Her family history includes Breast cancer (age of onset: 50) in her paternal aunt; COPD in her father; Heart disease (age of onset: 76) in her mother; Hyperlipidemia in her mother; Hypertension in her mother and sister;  Stroke (age of onset: 61) in her father.      Allergies  Allergen Reactions  . Celebrex [Celecoxib] Swelling and Shortness Of Breath    Swelling. Developed Facial swelling      Current Outpatient Medications:  .  albuterol (PROVENTIL HFA;VENTOLIN HFA) 108 (90 Base) MCG/ACT inhaler, Inhale 2 puffs into the lungs every 6 (six) hours as needed for wheezing or shortness of breath (cough, shortness  of breath or wheezing.)., Disp: 1 Inhaler, Rfl: 1 .  levothyroxine (SYNTHROID, LEVOTHROID) 112 MCG tablet, TAKE 1 TABLET EVERY DAY 30 MINUTES BEFORE BREAKFAST, Disp: 90 tablet, Rfl: 3 .  meloxicam (MOBIC) 15 MG tablet, Take 15 mg by mouth daily. , Disp: , Rfl:  .  pantoprazole (PROTONIX) 40 MG tablet, Take 40 mg by mouth daily., Disp: , Rfl:  .  traMADol (ULTRAM) 50 MG tablet, Take 50 mg by mouth every 12 (twelve) hours as needed. , Disp: , Rfl:    Patient Care Team: [name], PA-C as PCP - General (Family Medicine)      Objective:   Vitals: BP 128/88 (BP Location: Left Arm, Patient Position: Sitting, Cuff Size: Normal)   Pulse 72   Temp 97.7 F (36.5 C) (Oral)   Resp 16   Ht  (1.702 m)   Wt 211 lb (95.7 kg)   SpO2 97%   BMI 33.05 kg/m    Vitals:   05/01/18 0913  BP: 128/88  Pulse: 72  Resp: 16  Temp: 97.7 F (36.5 C)  TempSrc: Oral  SpO2: 97%  Weight: 211 lb (95.7 kg)  Height:  (1.702 m)     Physical Exam  Constitutional: She is oriented to person, place, and time. She appears well-developed and well-nourished. No distress.  HENT:  Head: Normocephalic and atraumatic.  Right Ear: Hearing, tympanic membrane, external ear and ear canal normal.  Left Ear: Hearing, tympanic membrane, external ear and ear canal normal.  Nose: Nose normal.  Mouth/Throat: Uvula is midline, oropharynx is clear and moist and mucous membranes are normal. No oropharyngeal exudate.  Eyes: Pupils are equal, round, and reactive to light. Conjunctivae and EOM are normal. Right eye  exhibits no discharge. Left eye exhibits no discharge. No scleral icterus.  Neck: Normal range of motion. Neck supple. No JVD present. Carotid bruit is not present. No tracheal deviation present. No thyromegaly present.  Cardiovascular: Normal rate, regular rhythm, normal heart sounds and intact distal pulses. Exam reveals no gallop and no friction rub.  No murmur heard. Pulmonary/Chest: Effort normal and breath sounds normal. No respiratory distress. She has no wheezes. She has no rales. She exhibits no tenderness. Right breast exhibits no inverted nipple, no mass, no nipple discharge, no skin change and no tenderness. Left breast exhibits no inverted nipple, no mass, no nipple discharge, no skin change and no tenderness. No breast swelling, tenderness, discharge or bleeding. Breasts are symmetrical.  Abdominal: Soft. Bowel sounds are normal. She exhibits no distension and no mass. There is no tenderness. There is no rebound and no guarding.  Musculoskeletal: Normal range of motion. She exhibits no edema or tenderness.  Lymphadenopathy:    She has no cervical adenopathy.  Neurological: She is alert and oriented to person, place, and time.  Skin: Skin is warm and dry. No rash noted. She is not diaphoretic.  Psychiatric: She has a normal mood and affect. Her behavior is normal. Judgment and thought content normal.  Vitals reviewed.    Depression Screen PHQ 2/9 Scores 05/01/2018 03/07/2018 05/03/2017 08/23/2016  PHQ - 2 Score 2 2 0 0  PHQ- 9 Score 9 8 - -      Assessment & Plan:     Routine Health Maintenance and Physical Exam  Exercise Activities and Dietary recommendations Goals    None      Immunization History  Administered Date(s) Administered  . Influenza Whole 08/04/2009  . Influenza,inj,Quad PF,6+ Mos 08/27/2013, 07/27/2014, 08/23/2016,  08/14/2017  . Influenza-Unspecified 09/04/2015  . Tdap 11/09/2014    Health Maintenance  Topic Date Due  . INFLUENZA VACCINE  07/04/2018   . PAP SMEAR  12/31/2018  . MAMMOGRAM  06/13/2019  . COLONOSCOPY  12/05/2023  . TETANUS/TDAP  11/09/2024  . Hepatitis C Screening  Completed  . HIV Screening  Completed     Discussed health benefits of physical activity, and encouraged her to engage in regular exercise appropriate for her age and condition.    1. Annual physical exam Normal physical exam today. Will check labs as below and f/u pending lab results. If labs are stable and WNL she will not need to have these rechecked for one year at her next annual physical exam. She is to call the office in the meantime if she has any acute issue, questions or concerns.  2. Breast cancer screening Breast exam today was normal. There is no family history of breast cancer. She does perform regular self breast exams. Mammogram was ordered as below. Information for [facility] was given to patient so she may schedule her mammogram at her convenience.  3. Localized primary carpometacarpal osteoarthrosis, right Stable. Diagnosis pulled for medication refill. Continue current medical treatment plan. - traMADol (ULTRAM) 50 MG tablet; Take 1 tablet (50 mg total) by mouth every 12 (twelve) hours as needed.  Dispense: 180 tablet; Refill: 1  4. Hypothyroidism following radioiodine therapy Stable. Diagnosis pulled for medication refill. Continue current medical treatment plan. - levothyroxine (SYNTHROID, LEVOTHROID) 112 MCG tablet; TAKE 1 TABLET EVERY DAY 30 MINUTES BEFORE BREAKFAST  Dispense: 90 tablet; Refill: 3  5. Gastroesophageal reflux disease without esophagitis Stable. Diagnosis pulled for medication refill. Continue current medical treatment plan. - omeprazole (PRILOSEC) 20 MG capsule; Take 1 capsule (20 mg total) by mouth 2 (two) times daily before a meal.  Dispense: 180 capsule; Refill: 1  6. Class 1 obesity due to excess calories with serious comorbidity and body mass index (BMI) of 33.0 to 33.9 in adult Counseled patient on  healthy lifestyle modifications including dieting and exercise. Patient desires medical nutrition counseling.  - Amb ref to Medical Nutrition Therapy-MNT  --------------------------------------------------------------------    [name], PA-C  [facility]

## 2018-05-01 NOTE — Patient Instructions (Signed)
Health Maintenance for Postmenopausal Women Menopause is a normal process in which your reproductive ability comes to an end. This process happens gradually over a span of months to years, usually between the ages of 22 and 9. Menopause is complete when you have missed 12 consecutive menstrual periods. It is important to talk with your health care provider about some of the most common conditions that affect postmenopausal women, such as heart disease, cancer, and bone loss (osteoporosis). Adopting a healthy lifestyle and getting preventive care can help to promote your health and wellness. Those actions can also lower your chances of developing some of these common conditions. What should I know about menopause? During menopause, you may experience a number of symptoms, such as:  Moderate-to-severe hot flashes.  Night sweats.  Decrease in sex drive.  Mood swings.  Headaches.  Tiredness.  Irritability.  Memory problems.  Insomnia.  Choosing to treat or not to treat menopausal changes is an individual decision that you make with your health care provider. What should I know about hormone replacement therapy and supplements? Hormone therapy products are effective for treating symptoms that are associated with menopause, such as hot flashes and night sweats. Hormone replacement carries certain risks, especially as you become older. If you are thinking about using estrogen or estrogen with progestin treatments, discuss the benefits and risks with your health care provider. What should I know about heart disease and stroke? Heart disease, heart attack, and stroke become more likely as you age. This may be due, in part, to the hormonal changes that your body experiences during menopause. These can affect how your body processes dietary fats, triglycerides, and cholesterol. Heart attack and stroke are both medical emergencies. There are many things that you can do to help prevent heart disease  and stroke:  Have your blood pressure checked at least every 1-2 years. High blood pressure causes heart disease and increases the risk of stroke.  If you are 53-22 years old, ask your health care provider if you should take aspirin to prevent a heart attack or a stroke.  Do not use any tobacco products, including cigarettes, chewing tobacco, or electronic cigarettes. If you need help quitting, ask your health care provider.  It is important to eat a healthy diet and maintain a healthy weight. ? Be sure to include plenty of vegetables, fruits, low-fat dairy products, and lean protein. ? Avoid eating foods that are high in solid fats, added sugars, or salt (sodium).  Get regular exercise. This is one of the most important things that you can do for your health. ? Try to exercise for at least 150 minutes each week. The type of exercise that you do should increase your heart rate and make you sweat. This is known as moderate-intensity exercise. ? Try to do strengthening exercises at least twice each week. Do these in addition to the moderate-intensity exercise.  Know your numbers.Ask your health care provider to check your cholesterol and your blood glucose. Continue to have your blood tested as directed by your health care provider.  What should I know about cancer screening? There are several types of cancer. Take the following steps to reduce your risk and to catch any cancer development as early as possible. Breast Cancer  Practice breast self-awareness. ? This means understanding how your breasts normally appear and feel. ? It also means doing regular breast self-exams. Let your health care provider know about any changes, no matter how small.  If you are 40  or older, have a clinician do a breast exam (clinical breast exam or CBE) every year. Depending on your age, family history, and medical history, it may be recommended that you also have a yearly breast X-ray (mammogram).  If you  have a family history of breast cancer, talk with your health care provider about genetic screening.  If you are at high risk for breast cancer, talk with your health care provider about having an MRI and a mammogram every year.  Breast cancer (BRCA) gene test is recommended for women who have family members with BRCA-related cancers. Results of the assessment will determine the need for genetic counseling and BRCA1 and for BRCA2 testing. BRCA-related cancers include these types: ? Breast. This occurs in males or females. ? Ovarian. ? Tubal. This may also be called fallopian tube cancer. ? Cancer of the abdominal or pelvic lining (peritoneal cancer). ? Prostate. ? Pancreatic.  Cervical, Uterine, and Ovarian Cancer Your health care provider may recommend that you be screened regularly for cancer of the pelvic organs. These include your ovaries, uterus, and vagina. This screening involves a pelvic exam, which includes checking for microscopic changes to the surface of your cervix (Pap test).  For women ages 21-65, health care providers may recommend a pelvic exam and a Pap test every three years. For women ages 79-65, they may recommend the Pap test and pelvic exam, combined with testing for human papilloma virus (HPV), every five years. Some types of HPV increase your risk of cervical cancer. Testing for HPV may also be done on women of any age who have unclear Pap test results.  Other health care providers may not recommend any screening for nonpregnant women who are considered low risk for pelvic cancer and have no symptoms. Ask your health care provider if a screening pelvic exam is right for you.  If you have had past treatment for cervical cancer or a condition that could lead to cancer, you need Pap tests and screening for cancer for at least 20 years after your treatment. If Pap tests have been discontinued for you, your risk factors (such as having a new sexual partner) need to be  reassessed to determine if you should start having screenings again. Some women have medical problems that increase the chance of getting cervical cancer. In these cases, your health care provider may recommend that you have screening and Pap tests more often.  If you have a family history of uterine cancer or ovarian cancer, talk with your health care provider about genetic screening.  If you have vaginal bleeding after reaching menopause, tell your health care provider.  There are currently no reliable tests available to screen for ovarian cancer.  Lung Cancer Lung cancer screening is recommended for adults 69-62 years old who are at high risk for lung cancer because of a history of smoking. A yearly low-dose CT scan of the lungs is recommended if you:  Currently smoke.  Have a history of at least 30 pack-years of smoking and you currently smoke or have quit within the past 15 years. A pack-year is smoking an average of one pack of cigarettes per day for one year.  Yearly screening should:  Continue until it has been 15 years since you quit.  Stop if you develop a health problem that would prevent you from having lung cancer treatment.  Colorectal Cancer  This type of cancer can be detected and can often be prevented.  Routine colorectal cancer screening usually begins at  age 42 and continues through age 45.  If you have risk factors for colon cancer, your health care provider may recommend that you be screened at an earlier age.  If you have a family history of colorectal cancer, talk with your health care provider about genetic screening.  Your health care provider may also recommend using home test kits to check for hidden blood in your stool.  A small camera at the end of a tube can be used to examine your colon directly (sigmoidoscopy or colonoscopy). This is done to check for the earliest forms of colorectal cancer.  Direct examination of the colon should be repeated every  5-10 years until age 71. However, if early forms of precancerous polyps or small growths are found or if you have a family history or genetic risk for colorectal cancer, you may need to be screened more often.  Skin Cancer  Check your skin from head to toe regularly.  Monitor any moles. Be sure to tell your health care provider: ? About any new moles or changes in moles, especially if there is a change in a mole's shape or color. ? If you have a mole that is larger than the size of a pencil eraser.  If any of your family members has a history of skin cancer, especially at a young age, talk with your health care provider about genetic screening.  Always use sunscreen. Apply sunscreen liberally and repeatedly throughout the day.  Whenever you are outside, protect yourself by wearing long sleeves, pants, a wide-brimmed hat, and sunglasses.  What should I know about osteoporosis? Osteoporosis is a condition in which bone destruction happens more quickly than new bone creation. After menopause, you may be at an increased risk for osteoporosis. To help prevent osteoporosis or the bone fractures that can happen because of osteoporosis, the following is recommended:  If you are 46-71 years old, get at least 1,000 mg of calcium and at least 600 mg of vitamin D per day.  If you are older than age 55 but younger than age 65, get at least 1,200 mg of calcium and at least 600 mg of vitamin D per day.  If you are older than age 54, get at least 1,200 mg of calcium and at least 800 mg of vitamin D per day.  Smoking and excessive alcohol intake increase the risk of osteoporosis. Eat foods that are rich in calcium and vitamin D, and do weight-bearing exercises several times each week as directed by your health care provider. What should I know about how menopause affects my mental health? Depression may occur at any age, but it is more common as you become older. Common symptoms of depression  include:  Low or sad mood.  Changes in sleep patterns.  Changes in appetite or eating patterns.  Feeling an overall lack of motivation or enjoyment of activities that you previously enjoyed.  Frequent crying spells.  Talk with your health care provider if you think that you are experiencing depression. What should I know about immunizations? It is important that you get and maintain your immunizations. These include:  Tetanus, diphtheria, and pertussis (Tdap) booster vaccine.  Influenza every year before the flu season begins.  Pneumonia vaccine.  Shingles vaccine.  Your health care provider may also recommend other immunizations. This information is not intended to replace advice given to you by your health care provider. Make sure you discuss any questions you have with your health care provider. Document Released: 01/12/2006  Document Revised: 06/09/2016 Document Reviewed: 08/24/2015 Elsevier Interactive Patient Education  2018 [employer]  

## 2018-05-24 ENCOUNTER — Encounter: Payer: Federal, State, Local not specified - PPO | Attending: Physician Assistant | Admitting: Dietician

## 2018-05-24 ENCOUNTER — Encounter: Payer: Self-pay | Admitting: Dietician

## 2018-05-24 VITALS — Ht 67.0 in | Wt 210.9 lb

## 2018-05-24 DIAGNOSIS — Z6833 Body mass index (BMI) 33.0-33.9, adult: Secondary | ICD-10-CM

## 2018-05-24 DIAGNOSIS — E6609 Other obesity due to excess calories: Secondary | ICD-10-CM

## 2018-05-24 NOTE — Patient Instructions (Addendum)
   Guidelines for nutrition label reading: 10g or less per serving for lower sugar and fat options  Look for lower fat or reduced portions of dressings, butter, sauces, and condiments  Meal outline: 1/4 plate (at least 3oz) protein + a vegetable or two (at least 1/2 cup) + a starch (1/3-2/3 cup) or a small potato/ roll + add 1oz/2Tbs healthy fats like dressings, oil, nuts, avocado. Include dairy and fruits in your day too!  Look for tub/ spread-able butter in place of stick butter for cooking  Look at nutrition facts labels for serving sizes.This can help you determine how much of certain foods to eat in one sitting  Try [place] fruit no calorie sweetener  Use the 80/20 rule way of eating. 80% of the time eat nutrient-dense whole foods, and 20% of the time you can include "soul" foods, like sugar in your coffee! No foods are "bad" or off limits  Cooking Light or Eating Well magazines / websites for healthy recipes

## 2018-05-24 NOTE — Progress Notes (Signed)
Medical Nutrition Therapy: Visit start time: 1500  end time: 1600  Assessment:  Diagnosis: Obesity Past medical history: GERD, Hypothyroidism, Fibromyalgia, Vit D deficiency Psychosocial issues/ stress concerns: None  Preferred learning method:  . Auditory . Visual . Hands-on  Current weight: 210.9#  Height: 5\' 7"  Medications, supplements: Synthroid, Nexium, Tramadol, Meoxicam  Progress and evaluation: Patient is seeking knowledge on how to eat in a healthy way that does not involve going on another fad diet. She had tried Nutrisystem in the past and attended a keto seminar recently which has made her hesitant to eat carbohydrates. She has stopped preparing sources of grains and pastas at dinner recently d/t his. She felt that she did well with the structure that Nutrisystem provided, but desires to know how to balance meals on her own. Other interventions include reducing the amount of sugar she puts in her coffee daily, omitting the bun at [employer] sub restaurant when she goes there for lunch, and reducing the amount of sweet tea she drinks to two per week and also orders smaller sizes. She does no cook fried food d/t her husband disliking it. 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 Dining out frequency: 7 meals per week, mostly lunch.  Breakfast: not always. Biscuit or sandwich, used to eat cereal, Slim Fast shake and Renaldo Fiddler recently, pack of crackers in a pinch Snack: not usually Lunch: out: [employer] sub, [place] grilled chicken [place] salad with part of a container of ranch dressing Snack: not usually Supper: grilled meats, pork tenderloin, sauteed vegetables, homemade BBQ sauce Snack: fruit or sweets; stress eats sometimes Beverages: water, Twist diet drink, Propel, diet Ginger Ale, 2 cups of coffee with sugar, sweet  tea two days per week  Nutrition Care Education: Topics covered: reading nutrition labels and identifying foods low in fat and sugar, plate method and standard serving sizes, 80/20 rule of eating and emphasizing an "all foods fit" approach rather than all-or-nothing thinking regarding foods, why diets fail, types of fats, importance of each food group in a healthy diet, added vs natural sugars Basic nutrition: basic food groups, appropriate nutrient balance, appropriate meal and snack schedule, general nutrition guidelines    Weight control: benefits of weight control, behavioral changes for weight loss Advanced nutrition: cooking techniques, dining out, food label reading Other lifestyle changes: benefits of making changes, increasing motivation, readiness for change, identifying habits that need to change  Nutritional Diagnosis:  Ridge-3.3 Overweight/obesity As related to lifestyle choices.  As evidenced by BMI 33.01, minimal physical activity.  Intervention: Discussion as noted above. She will work on thinking of food in a non-diet mentality and instead work on what balanced eating means for her. She is considering re-incorporating sources of carbohydrates and plans to use the portion sizes provided to help her plan balanced meals. She will also look for new healthy recipes to cook since she does enjoy cooking.  Education Materials given:  . Food lists/ Planning A Balanced Meal . Goals/ instructions  Learner/ who was taught:  . Patient   Level of understanding: Marland Kitchen Verbalizes/ demonstrates competency  Demonstrated degree of understanding via:   Teach back Learning barriers: . None  Willingness to learn/ readiness for change: . Acceptance, ready for change  Monitoring and Evaluation:  Dietary intake, exercise, and body weight  follow up: prn. Email and phone number provided for questions or to schedule follow up

## 2018-06-03 ENCOUNTER — Other Ambulatory Visit: Payer: Self-pay | Admitting: Physician Assistant

## 2018-06-19 ENCOUNTER — Ambulatory Visit
Admission: RE | Admit: 2018-06-19 | Discharge: 2018-06-19 | Disposition: A | Discharge status: Home / Self Care | Payer: Federal, State, Local not specified - PPO | Source: Ambulatory Visit | Attending: Physician Assistant | Admitting: Physician Assistant

## 2018-06-19 ENCOUNTER — Encounter: Payer: Self-pay | Admitting: Physician Assistant

## 2018-06-19 ENCOUNTER — Ambulatory Visit: Payer: Federal, State, Local not specified - PPO | Admitting: Physician Assistant

## 2018-06-19 VITALS — BP 150/80 | HR 82 | Temp 98.2°F | Resp 18 | Wt 210.0 lb

## 2018-06-19 DIAGNOSIS — K219 Gastro-esophageal reflux disease without esophagitis: Secondary | ICD-10-CM | POA: Diagnosis not present

## 2018-06-19 DIAGNOSIS — R110 Nausea: Secondary | ICD-10-CM | POA: Diagnosis not present

## 2018-06-19 DIAGNOSIS — R0602 Shortness of breath: Secondary | ICD-10-CM

## 2018-06-19 DIAGNOSIS — R1013 Epigastric pain: Secondary | ICD-10-CM

## 2018-06-19 DIAGNOSIS — R1032 Left lower quadrant pain: Secondary | ICD-10-CM | POA: Diagnosis not present

## 2018-06-19 DIAGNOSIS — R0600 Dyspnea, unspecified: Secondary | ICD-10-CM | POA: Diagnosis not present

## 2018-06-19 NOTE — Patient Instructions (Signed)
Food Choices for Gastroesophageal Reflux Disease, Adult When you have gastroesophageal reflux disease (GERD), the foods you eat and your eating habits are very important. Choosing the right foods can help ease your discomfort. What guidelines do I need to follow?  Choose fruits, vegetables, whole grains, and low-fat dairy products.  Choose low-fat meat, fish, and poultry.  Limit fats such as oils, salad dressings, butter, nuts, and avocado.  Keep a food diary. This helps you identify foods that cause symptoms.  Avoid foods that cause symptoms. These may be different for everyone.  Eat small meals often instead of 3 large meals a day.  Eat your meals slowly, in a place where you are relaxed.  Limit fried foods.  Cook foods using methods other than frying.  Avoid drinking alcohol.  Avoid drinking large amounts of liquids with your meals.  Avoid bending over or lying down until 2-3 hours after eating. What foods are not recommended? These are some foods and drinks that may make your symptoms worse: Vegetables  Tomatoes. Tomato juice. Tomato and spaghetti sauce. Chili peppers. Onion and garlic. Horseradish. Fruits  Oranges, grapefruit, and lemon (fruit and juice). Meats  High-fat meats, fish, and poultry. This includes hot dogs, ribs, ham, sausage, salami, and bacon. Dairy  Whole milk and chocolate milk. Sour cream. Cream. Butter. Ice cream. Cream cheese. Drinks  Coffee and tea. Bubbly (carbonated) drinks or energy drinks. Condiments  Hot sauce. Barbecue sauce. Sweets/Desserts  Chocolate and cocoa. Donuts. Peppermint and spearmint. Fats and Oils  High-fat foods. This includes French fries and potato chips. Other  Vinegar. Strong spices. This includes black pepper, white pepper, red pepper, cayenne, curry powder, cloves, ginger, and chili powder. The items listed above may not be a complete list of foods and drinks to avoid. Contact your dietitian for more information.    This information is not intended to replace advice given to you by your health care provider. Make sure you discuss any questions you have with your health care provider. Document Released: 05/21/2012 Document Revised: 04/27/2016 Document Reviewed: 09/24/2013 Elsevier Interactive Patient Education  2017 Elsevier Inc.  

## 2018-06-19 NOTE — Progress Notes (Signed)
Patient: Jane JewsVickie H Orzel Female    DOB: 1961/01/20   57 y.o.   MRN: [id number] Visit Date: 06/19/2018  Today's Provider: Margaretann LovelessJennifer M Burnette, PA-C   Chief Complaint  Patient presents with  . Shortness of Breath   Subjective:    Shortness of Breath  This is a new problem. The current episode started 1 to 4 weeks ago. The problem occurs constantly (she reports she feels sob while walking but worsening with walking.). The problem has been gradually worsening. Associated symptoms include abdominal pain (LLQ and epigastric region-reports that when she goes to sleep she needs to get up from her bed because she feels the reflux coming up.). Pertinent negatives include no chest pain, fever, headaches, leg pain, vomiting or wheezing. The symptoms are aggravated by any activity and lying flat. Treatments tried: inhaler. The treatment provided no relief.   She also reports increased fatigue through this time as well. Increased gas pains throughout lower abdomen last night (took a gas pill that lessened symptoms). Denies constipation or diarrhea. Denies vomiting. Has lessened portion sizes to help with reflux symptoms. Recently restarted omeprazole but reports symptoms have worsened since restarting.   She does have asthma (mild intermittent) and reports she knows it is hot and poor air quality but she normally can get in her [facility] and symptoms resolve quickly. Now symptoms are not improving and lingering.    Allergies  Allergen Reactions  . Celebrex [Celecoxib] Swelling and Shortness Of Breath    Swelling. Developed Facial swelling      Current Outpatient Medications:  .  levothyroxine (SYNTHROID, LEVOTHROID) 112 MCG tablet, TAKE 1 TABLET EVERY DAY 30 MINUTES BEFORE BREAKFAST, Disp: 90 tablet, Rfl: 3 .  meloxicam (MOBIC) 15 MG tablet, Take 15 mg by mouth daily. , Disp: , Rfl:  .  omeprazole (PRILOSEC) 20 MG capsule, Take 1 capsule (20 mg total) by mouth 2 (two) times daily  before a meal., Disp: 180 capsule, Rfl: 1 .  PROAIR HFA 108 (90 Base) MCG/ACT inhaler, INHALE 2 PUFFS INTO THE LUNGS EVERY 6 HOURS AS NEEDED FOR WHEEZING, COUGH OR SHORTNESS OF BREATH, Disp: 8.5 g, Rfl: 11 .  traMADol (ULTRAM) 50 MG tablet, Take 1 tablet (50 mg total) by mouth every 12 (twelve) hours as needed., Disp: 180 tablet, Rfl: 1  Review of Systems  Constitutional: Negative for fever.  HENT: Negative.   Respiratory: Positive for shortness of breath. Negative for cough, chest tightness and wheezing.   Cardiovascular: Negative for chest pain.  Gastrointestinal: Positive for abdominal pain (LLQ and epigastric region-reports that when she goes to sleep she needs to get up from her bed because she feels the reflux coming up.) and nausea. Negative for constipation, diarrhea and vomiting.  Neurological: Negative for dizziness, light-headedness and headaches.    Social History   Tobacco Use  . Smoking status: Former Smoker    Types: Cigarettes    Last attempt to quit: 10/12/1994    Years since quitting: 23.7  . Smokeless tobacco: Never Used  . Tobacco comment: smoked 2ppd for 15 years; quit in 1995   Substance Use Topics  . Alcohol use: Yes    Alcohol/week: 0.0 oz    Comment: social, rare   Objective:   BP (!) 150/80 (BP Location: Left Arm, Patient Position: Sitting, Cuff Size: Normal)   Pulse 82   Temp 98.2 F (36.8 C) (Oral)   Resp 18   Wt 210 lb (95.3 kg)  SpO2 96%   BMI 32.89 kg/m  Vitals:   06/19/18 1345  BP: (!) 150/80  Pulse: 82  Resp: 18  Temp: 98.2 F (36.8 C)  TempSrc: Oral  SpO2: 96%  Weight: 210 lb (95.3 kg)     Physical Exam  Constitutional: She appears well-developed and well-nourished. No distress.  Neck: Normal range of motion. Neck supple. No JVD present. No tracheal deviation present. No thyromegaly present.  Cardiovascular: Normal rate, regular rhythm and normal heart sounds. Exam reveals no gallop and no friction rub.  No murmur  heard. Pulmonary/Chest: Effort normal and breath sounds normal. No respiratory distress. She has no decreased breath sounds. She has no wheezes. She has no rhonchi. She has no rales. She exhibits no tenderness.  Abdominal: Soft. Normal appearance and bowel sounds are normal. She exhibits no distension, no ascites and no mass. There is tenderness in the epigastric area and left lower quadrant. There is no rigidity, no guarding, no tenderness at McBurney's point and negative Murphy's sign.  Lymphadenopathy:    She has no cervical adenopathy.  Skin: She is not diaphoretic.  Vitals reviewed.      Assessment & Plan:     1. SOB (shortness of breath) EKG today showed NSR rate of 78. No ST changes noted. Will check labs as below to r/o infectious cause or gallbladder. H.pylori breath test collected today. Will f/u pending results. CXR ordered for SOB. DDx: H. Pylori, GERD uncontrolled, hiatal hernia, asthmatic flare with viral infection, CHF early onset. I will be in touch via phone with results and will see her back in 2 weeks to recheck and see how she is doing. She is to call if symptoms worsen in the meantime. - EKG 12-Lead - CBC w/Diff/Platelet - Basic Metabolic Panel (BMET) - Lipase - H. pylori breath test - DG Chest 2 View; Future  2. Nausea See above medical treatment plan. - CBC w/Diff/Platelet - Basic Metabolic Panel (BMET) - Lipase - H. pylori breath test - DG Chest 2 View; Future  3. Epigastric pain See above medical treatment plan. - CBC w/Diff/Platelet - Basic Metabolic Panel (BMET) - Lipase - H. pylori breath test - DG Chest 2 View; Future  4. LLQ pain See above medical treatment plan. - CBC w/Diff/Platelet - Basic Metabolic Panel (BMET) - Lipase - H. pylori breath test  5. Gastroesophageal reflux disease, esophagitis presence not specified See above medical treatment plan. - CBC w/Diff/Platelet - Basic Metabolic Panel (BMET) - Lipase - H. pylori breath  test       [name], PA-C  [facility]

## 2018-06-20 ENCOUNTER — Telehealth: Payer: Self-pay

## 2018-06-20 LAB — CBC WITH DIFFERENTIAL/PLATELET
BASOS ABS: 0 10*3/uL (ref 0.0–0.2)
Basos: 0 %
EOS (ABSOLUTE): 0.2 10*3/uL (ref 0.0–0.4)
Eos: 3 %
Hematocrit: 38.7 % (ref 34.0–46.6)
Hemoglobin: 13.1 g/dL (ref 11.1–15.9)
IMMATURE GRANULOCYTES: 0 %
Immature Grans (Abs): 0 10*3/uL (ref 0.0–0.1)
LYMPHS: 23 %
Lymphocytes Absolute: 1.6 10*3/uL (ref 0.7–3.1)
MCH: 28.7 pg (ref 26.6–33.0)
MCHC: 33.9 g/dL (ref 31.5–35.7)
MCV: 85 fL (ref 79–97)
Monocytes Absolute: 0.6 10*3/uL (ref 0.1–0.9)
Monocytes: 9 %
NEUTROS PCT: 65 %
Neutrophils Absolute: 4.5 10*3/uL (ref 1.4–7.0)
PLATELETS: 231 10*3/uL (ref 150–450)
RBC: 4.57 x10E6/uL (ref 3.77–5.28)
RDW: 14.1 % (ref 12.3–15.4)
WBC: 7 10*3/uL (ref 3.4–10.8)

## 2018-06-20 LAB — LIPASE: LIPASE: 49 U/L (ref 14–72)

## 2018-06-20 LAB — BASIC METABOLIC PANEL
BUN / CREAT RATIO: 27 — AB (ref 9–23)
BUN: 20 mg/dL (ref 6–24)
CALCIUM: 9.4 mg/dL (ref 8.7–10.2)
CHLORIDE: 106 mmol/L (ref 96–106)
CO2: 23 mmol/L (ref 20–29)
Creatinine, Ser: 0.75 mg/dL (ref 0.57–1.00)
GFR calc Af Amer: 102 mL/min/{1.73_m2} (ref >59)
GFR calc non Af Amer: 88 mL/min/{1.73_m2} (ref >59)
GLUCOSE: 101 mg/dL — AB (ref 65–99)
POTASSIUM: 3.9 mmol/L (ref 3.5–5.2)
Sodium: 144 mmol/L (ref 134–144)

## 2018-06-20 NOTE — Telephone Encounter (Signed)
LMTCB

## 2018-06-20 NOTE — Telephone Encounter (Signed)
Patient was notified of results. Expressed understanding.  

## 2018-06-20 NOTE — Telephone Encounter (Signed)
-----   Message from Margaretann LovelessJennifer M Burnette, [place]PA-C sent at 06/20/2018 12:24 PM EDT ----- CXR is normal.

## 2018-06-20 NOTE — Telephone Encounter (Signed)
-----   Message from Margaretann LovelessJennifer M Burnette, [place]-C sent at 06/20/2018 12:30 PM EDT ----- Labs are also unremarkable. Will await h.pylori breath testing results.

## 2018-06-21 ENCOUNTER — Telehealth: Payer: Self-pay

## 2018-06-21 DIAGNOSIS — K219 Gastro-esophageal reflux disease without esophagitis: Secondary | ICD-10-CM

## 2018-06-21 LAB — H. PYLORI BREATH TEST: H PYLORI BREATH TEST: NEGATIVE

## 2018-06-21 NOTE — Telephone Encounter (Signed)
Pt advised.   She would like to proceed with a GI referral.   Thanks,   -Vernona RiegerLaura

## 2018-06-21 NOTE — Telephone Encounter (Signed)
OK will place referral.

## 2018-06-21 NOTE — Telephone Encounter (Signed)
-----   Message from Margaretann LovelessJennifer M Burnette, PA-C sent at 06/21/2018  8:45 AM EDT ----- H.pylori breath test is negative. We can proceed to get a barium swallow to see if you have a hiatal hernia vs referral to GI for consideration of an endoscopy. Preference?

## 2018-07-24 ENCOUNTER — Ambulatory Visit: Payer: Federal, State, Local not specified - PPO | Admitting: Gastroenterology

## 2018-07-24 ENCOUNTER — Other Ambulatory Visit: Payer: Self-pay

## 2018-07-24 ENCOUNTER — Encounter: Payer: Self-pay | Admitting: Gastroenterology

## 2018-07-24 VITALS — BP 123/70 | HR 92 | Ht 67.0 in | Wt 209.4 lb

## 2018-07-24 DIAGNOSIS — K219 Gastro-esophageal reflux disease without esophagitis: Secondary | ICD-10-CM

## 2018-07-24 DIAGNOSIS — Z1211 Encounter for screening for malignant neoplasm of colon: Secondary | ICD-10-CM

## 2018-07-24 NOTE — Progress Notes (Signed)
Jane [name] [address]  [address]  [place], [place] [address]  Main: [phone]  Fax: [phone]   Gastroenterology Consultation  Referring Provider:     Suella Grove* Primary Care Physician:  Jane Loveless, PA-C Primary Gastroenterologist:  Dr. Melodie [name] Reason for Consultation:     GERD        HPI:    Chief Complaint  Patient presents with  . New Patient (Initial Visit)    referral from [facility], PA-C--GERD, hiatal hernia, lower left side pain    Jane [name] is a 57 y.o. y/o female referred for consultation & management  by Dr. Rosezetta Schlatter, Alessandra Bevels, PA-C.  Patient reports 2 to 84-month history of midepigastric abdominal pain, specifically occurring while sleeping, but also occurs during the day.  Also reports intermittent dysphagia.  No episodes of food impaction or having to bring food back up.  No weight loss.  Also reports that at times the midepigastric pain occurs with shortness of breath during the day.  She rests and the shortness of breath goes away.  Denies any blood in stool.  No prior EGD.  Had an upper GI study in October 2017, that showed mild prominence of the cricopharyngeus muscle.  Mild prominent cervical osteophytes also noted.  Small sliding hiatal hernia reported.  Patient had a colonoscopy in 2011 August by Dr. Bluford Kaufmann for screening.  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  Terminal ileum was reported to be normal.  Exam otherwise normal.  Biopsies showed hyperplastic changes.  Patient also reports intermittent left lower quadrant pain for the last 2 years.  Occurs on moving to that side intermittently.  Occurs on walking at times.  Not related to meals.  Denies any constipation.  Reports soft bowel movements, 2-3 times in the morning without blood.  Past Medical History:  Diagnosis Date  . Allergy   . Arthritis    Knees, feet, elbows , neck  . Asthma    mild; assoc with seasonal allergies; uses Albuterol  once per year on average.  Marland Kitchen Atypical chest pain    hx   . Complication of anesthesia    nausea, last 2 surgeries nausea much less  . Fibromyalgia 12/04/2010   [facility] in [place]; Dr. Gwenevere Ghazi; followed every six months.  . Fibromyalgia   . GERD (gastroesophageal reflux disease)   . Heart murmur    Mitral valve Prolapse  . HLD (hyperlipidemia)   . Hypothyroidism post radioactive iodine   Hyperthyroidism; now secondary hypothyroidism  . MR (mitral regurgitation)    moderate; probably MVP. echo 3/10 at [facility] cardiology with normal EF, moderate MR     Past Surgical History:  Procedure Laterality Date  . COLONOSCOPY  12/04/2009   normal; I[facility]har  . inner thigh surgery    . JOINT REPLACEMENT    . KNEE ARTHROPLASTY Right 10/25/2016   Procedure: COMPUTER ASSISTED TOTAL KNEE ARTHROPLASTY;  Surgeon: Donato Heinz, MD;  Location: [facility] ORS;  Service: Orthopedics;  Laterality: Right;  . KNEE SURGERY  7/10  . 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 left foot surgery     for plantar fascitis  . novascue uterine ablation  2007  . REPLACEMENT TOTAL KNEE Left     Prior to Admission medications   Medication Sig Start Date End Date Taking? Authorizing Provider  levothyroxine (SYNTHROID, LEVOTHROID) 112 MCG tablet TAKE 1 TABLET EVERY DAY 30 MINUTES BEFORE BREAKFAST 05/01/18  Yes Burnette,  Alessandra BevelsJennifer M, PA-C  meloxicam (MOBIC) 15 MG tablet Take 15 mg by mouth daily.  04/21/17  Yes [provider]  omeprazole (PRILOSEC) 20 MG capsule Take 1 capsule (20 mg total) by mouth 2 (two) times daily before a meal. 05/01/18  Yes Jane LovelessBurnette, Jennifer M, PA-C  PROAIR HFA 108 (90 Base) MCG/ACT inhaler INHALE 2 PUFFS INTO THE LUNGS EVERY 6 HOURS AS NEEDED FOR WHEEZING, COUGH OR SHORTNESS OF BREATH 06/03/18  Yes Burnette, Victorino DikeJennifer M, PA-C  traMADol (ULTRAM) 50 MG tablet Take 1 tablet (50 mg total) by mouth every 12 (twelve) hours as needed. 05/01/18  Yes Jane LovelessBurnette, Jennifer M, PA-C    Family  History  Problem Relation Age of Onset  . Stroke Father 4769       CVA  . COPD Father   . Hyperlipidemia Mother   . Heart disease Mother 5155       CABG in 6950s; AMI age 58  . Hypertension Mother   . Hypertension Sister   . Breast cancer Paternal Aunt [id number]     Social History   Tobacco Use  . Smoking status: Former Smoker    Types: Cigarettes    Last attempt to quit: 10/12/1994    Years since quitting: 23.7  . Smokeless tobacco: Never Used  . Tobacco comment: smoked 2ppd for 15 years; quit in 1995   Substance Use Topics  . Alcohol use: Yes    Alcohol/week: 0.0 standard drinks    Comment: social, rare  . Drug use: No    Allergies as of 07/24/2018 - Review Complete 07/24/2018  Allergen Reaction Noted  . Celebrex [celecoxib] Swelling and Shortness Of Breath 10/20/2012    Review of Systems:    All systems reviewed and negative except where noted in HPI.   Physical Exam:  BP 123/70   Pulse 92   Ht 5\' 7"  (1.702 m)   Wt 209 lb 6.4 oz (95 kg)   BMI 32.80 kg/m  No LMP recorded. Patient is postmenopausal. Psych:  Alert and cooperative. Normal mood and affect. General:   Alert,  Well-developed, well-nourished, pleasant and cooperative in NAD Head:  Normocephalic and atraumatic. Eyes:  Sclera clear, no icterus.   Conjunctiva pink. Ears:  Normal auditory acuity. Nose:  No deformity, discharge, or lesions. Mouth:  No deformity or lesions,oropharynx pink & moist. Neck:  Supple; no masses or thyromegaly. Lungs:  Respirations even and unlabored.  Clear throughout to auscultation.   No wheezes, crackles, or rhonchi. No acute distress. Heart:  Regular rate and rhythm; no murmurs, clicks, rubs, or gallops. Abdomen:  Normal bowel sounds.  No bruits.  Soft, non-tender and non-distended without masses, hepatosplenomegaly or hernias noted.  No guarding or rebound tenderness.    Msk:  Symmetrical without gross deformities. Good, equal movement & strength bilaterally. Pulses:  Normal pulses  noted. Extremities:  No clubbing or edema.  No cyanosis. Neurologic:  Alert and oriented x3;  grossly normal neurologically. Skin:  Intact without significant lesions or rashes. No jaundice. Lymph Nodes:  No significant cervical adenopathy. Psych:  Alert and cooperative. Normal mood and affect.   Labs: CBC    Component Value Date/Time   WBC 7.0 06/19/2018 1447   WBC 9.4 10/30/2016 1641   RBC 4.57 06/19/2018 1447   RBC 3.99 10/30/2016 1641   HGB 13.1 06/19/2018 1447   HCT 38.7 06/19/2018 1447   PLT 231 06/19/2018 1447   MCV 85 06/19/2018 1447   MCV 86 12/09/2012 0845   MCH 28.7  06/19/2018 1447   MCH 28.9 10/30/2016 1641   MCHC 33.9 06/19/2018 1447   MCHC 33.7 10/30/2016 1641   RDW 14.1 06/19/2018 1447   RDW 13.3 12/09/2012 0845   LYMPHSABS 1.6 06/19/2018 1447   MONOABS 0.6 10/30/2016 1641   EOSABS 0.2 06/19/2018 1447   BASOSABS 0.0 06/19/2018 1447   CMP     Component Value Date/Time   NA 144 06/19/2018 1447   NA 138 12/27/2012 0427   K 3.9 06/19/2018 1447   K 4.1 12/27/2012 0427   CL 106 06/19/2018 1447   CL 106 12/27/2012 0427   CO2 23 06/19/2018 1447   CO2 28 12/27/2012 0427   GLUCOSE 101 (H) 06/19/2018 1447   GLUCOSE 135 (H) 10/30/2016 1641   GLUCOSE 85 12/27/2012 0427   BUN 20 06/19/2018 1447   BUN 11 12/27/2012 0427   CREATININE 0.75 06/19/2018 1447   CREATININE 0.80 08/23/2016 1435   CALCIUM 9.4 06/19/2018 1447   CALCIUM 8.1 (L) 12/27/2012 0427   PROT 7.0 03/07/2018 1554   ALBUMIN 4.6 03/07/2018 1554   AST 25 03/07/2018 1554   ALT 26 03/07/2018 1554   ALKPHOS 78 03/07/2018 1554   BILITOT <0.2 03/07/2018 1554   GFRNONAA 88 06/19/2018 1447   GFRNONAA >89 11/09/2014 0847   GFRAA 102 06/19/2018 1447   GFRAA >89 11/09/2014 0847    Imaging Studies: No results found.  Assessment and Plan:   Jane [name] is a 57 y.o. y/o female has been referred for midepigastric pain, evaluation for GERD, left lower quadrant pain, with history of ileocecal valve  ulcer seen in 2011 colonoscopy, sliding hiatal hernia seen in upper GI study in 2017  Due to midepigastric pain uncontrolled with PPI, will proceed with EGD for evaluation I have told patient, that I do not think her intermittent shortness of breath is related to GERD, and she should follow-up with her primary care provider who can consider pulmonary function tests for this She verbalized understanding of this EGD is also to evaluate her intermittent dysphagia that she has ongoing since 2017, and to obtain biopsies for EOE  Due to left lower quadrant pain, and ileocecal valve ulcer noticed in 2011, will proceed with colonoscopy both for screening and evaluation of the above. I have discussed alternative options, risks & benefits,  which include, but are not limited to, bleeding, infection, perforation,respiratory complication & drug reaction.  The patient agrees with this plan & written consent will be obtained.    Patient educated extensively on acid reflux lifestyle modification, including buying a bed wedge, not eating 3 hrs before bedtime, diet modifications, and handout given for the same.    Dr Jane BouillonVarnita Nolberto Cheuvront

## 2018-08-06 ENCOUNTER — Telehealth: Payer: Self-pay | Admitting: Gastroenterology

## 2018-08-06 NOTE — Telephone Encounter (Signed)
Pt left vm she has procedure scheduled for Friday and would like to know if she can eat Watermelon please call pt

## 2018-08-07 NOTE — Telephone Encounter (Signed)
Pt advised not to eat watermelon. This is red and has seeds. She states she ate 2 bites, no seeds. Did not eat anymore. Pt reminded not to eat/drink anything red in color.

## 2018-08-09 ENCOUNTER — Encounter: Payer: Self-pay | Admitting: *Deleted

## 2018-08-09 ENCOUNTER — Ambulatory Visit
Admission: RE | Admit: 2018-08-09 | Discharge: 2018-08-09 | Disposition: A | Discharge status: Home / Self Care | Payer: Federal, State, Local not specified - PPO | Source: Ambulatory Visit | Attending: Gastroenterology | Admitting: Gastroenterology

## 2018-08-09 ENCOUNTER — Ambulatory Visit: Payer: Federal, State, Local not specified - PPO | Admitting: Registered Nurse

## 2018-08-09 ENCOUNTER — Encounter
Admission: RE | Disposition: A | Discharge status: Home / Self Care | Payer: Self-pay | Source: Ambulatory Visit | Attending: Gastroenterology

## 2018-08-09 ENCOUNTER — Telehealth: Payer: Self-pay | Admitting: Gastroenterology

## 2018-08-09 DIAGNOSIS — E890 Postprocedural hypothyroidism: Secondary | ICD-10-CM | POA: Insufficient documentation

## 2018-08-09 DIAGNOSIS — M797 Fibromyalgia: Secondary | ICD-10-CM | POA: Diagnosis not present

## 2018-08-09 DIAGNOSIS — R1013 Epigastric pain: Secondary | ICD-10-CM

## 2018-08-09 DIAGNOSIS — F419 Anxiety disorder, unspecified: Secondary | ICD-10-CM | POA: Insufficient documentation

## 2018-08-09 DIAGNOSIS — J45909 Unspecified asthma, uncomplicated: Secondary | ICD-10-CM | POA: Insufficient documentation

## 2018-08-09 DIAGNOSIS — K6289 Other specified diseases of anus and rectum: Secondary | ICD-10-CM

## 2018-08-09 DIAGNOSIS — E785 Hyperlipidemia, unspecified: Secondary | ICD-10-CM | POA: Insufficient documentation

## 2018-08-09 DIAGNOSIS — R1319 Other dysphagia: Secondary | ICD-10-CM

## 2018-08-09 DIAGNOSIS — K317 Polyp of stomach and duodenum: Secondary | ICD-10-CM | POA: Diagnosis not present

## 2018-08-09 DIAGNOSIS — R011 Cardiac murmur, unspecified: Secondary | ICD-10-CM | POA: Diagnosis not present

## 2018-08-09 DIAGNOSIS — Z79899 Other long term (current) drug therapy: Secondary | ICD-10-CM | POA: Diagnosis not present

## 2018-08-09 DIAGNOSIS — Z87891 Personal history of nicotine dependence: Secondary | ICD-10-CM | POA: Diagnosis not present

## 2018-08-09 DIAGNOSIS — K5730 Diverticulosis of large intestine without perforation or abscess without bleeding: Secondary | ICD-10-CM

## 2018-08-09 DIAGNOSIS — Z1211 Encounter for screening for malignant neoplasm of colon: Secondary | ICD-10-CM

## 2018-08-09 DIAGNOSIS — L539 Erythematous condition, unspecified: Secondary | ICD-10-CM | POA: Diagnosis not present

## 2018-08-09 DIAGNOSIS — Z888 Allergy status to other drugs, medicaments and biological substances status: Secondary | ICD-10-CM | POA: Diagnosis not present

## 2018-08-09 DIAGNOSIS — R1310 Dysphagia, unspecified: Secondary | ICD-10-CM | POA: Insufficient documentation

## 2018-08-09 DIAGNOSIS — M47812 Spondylosis without myelopathy or radiculopathy, cervical region: Secondary | ICD-10-CM | POA: Insufficient documentation

## 2018-08-09 DIAGNOSIS — K2950 Unspecified chronic gastritis without bleeding: Secondary | ICD-10-CM | POA: Insufficient documentation

## 2018-08-09 DIAGNOSIS — M19072 Primary osteoarthritis, left ankle and foot: Secondary | ICD-10-CM | POA: Diagnosis not present

## 2018-08-09 DIAGNOSIS — M19071 Primary osteoarthritis, right ankle and foot: Secondary | ICD-10-CM | POA: Insufficient documentation

## 2018-08-09 DIAGNOSIS — K5790 Diverticulosis of intestine, part unspecified, without perforation or abscess without bleeding: Secondary | ICD-10-CM | POA: Diagnosis not present

## 2018-08-09 DIAGNOSIS — K219 Gastro-esophageal reflux disease without esophagitis: Secondary | ICD-10-CM

## 2018-08-09 DIAGNOSIS — M1712 Unilateral primary osteoarthritis, left knee: Secondary | ICD-10-CM | POA: Diagnosis not present

## 2018-08-09 DIAGNOSIS — M19021 Primary osteoarthritis, right elbow: Secondary | ICD-10-CM | POA: Insufficient documentation

## 2018-08-09 DIAGNOSIS — M19022 Primary osteoarthritis, left elbow: Secondary | ICD-10-CM | POA: Insufficient documentation

## 2018-08-09 DIAGNOSIS — Z8249 Family history of ischemic heart disease and other diseases of the circulatory system: Secondary | ICD-10-CM | POA: Diagnosis not present

## 2018-08-09 DIAGNOSIS — R1032 Left lower quadrant pain: Secondary | ICD-10-CM

## 2018-08-09 DIAGNOSIS — I341 Nonrheumatic mitral (valve) prolapse: Secondary | ICD-10-CM | POA: Diagnosis not present

## 2018-08-09 DIAGNOSIS — Z96651 Presence of right artificial knee joint: Secondary | ICD-10-CM | POA: Insufficient documentation

## 2018-08-09 HISTORY — PX: COLONOSCOPY WITH PROPOFOL: SHX5780

## 2018-08-09 HISTORY — PX: ESOPHAGOGASTRODUODENOSCOPY (EGD) WITH PROPOFOL: SHX5813

## 2018-08-09 SURGERY — COLONOSCOPY WITH PROPOFOL
Anesthesia: General

## 2018-08-09 MED ORDER — LIDOCAINE HCL (CARDIAC) PF 100 MG/5ML IV SOSY
PREFILLED_SYRINGE | INTRAVENOUS | Status: DC | PRN
  Administered 2018-08-09: 40 mg via INTRAVENOUS

## 2018-08-09 MED ORDER — SODIUM CHLORIDE 0.9 % IV SOLN
INTRAVENOUS | Status: DC
  Administered 2018-08-09: 1000 mL via INTRAVENOUS

## 2018-08-09 MED ORDER — PROPOFOL 500 MG/50ML IV EMUL
INTRAVENOUS | Status: AC
  Filled 2018-08-09: qty 50

## 2018-08-09 MED ORDER — MIDAZOLAM HCL 2 MG/2ML IJ SOLN
INTRAMUSCULAR | Status: DC | PRN
  Administered 2018-08-09: 2 mg via INTRAVENOUS

## 2018-08-09 MED ORDER — PROPOFOL 10 MG/ML IV BOLUS
INTRAVENOUS | Status: DC | PRN
  Administered 2018-08-09: 90 mg via INTRAVENOUS

## 2018-08-09 MED ORDER — GLYCOPYRROLATE 0.2 MG/ML IJ SOLN
INTRAMUSCULAR | Status: DC | PRN
  Administered 2018-08-09 (×2): 0.2 mg via INTRAVENOUS

## 2018-08-09 MED ORDER — MIDAZOLAM HCL 2 MG/2ML IJ SOLN
INTRAMUSCULAR | Status: AC
  Filled 2018-08-09: qty 2

## 2018-08-09 MED ORDER — PROPOFOL 500 MG/50ML IV EMUL
INTRAVENOUS | Status: DC | PRN
  Administered 2018-08-09: 150 ug/kg/min via INTRAVENOUS

## 2018-08-09 NOTE — Telephone Encounter (Signed)
Pt states that left eye is somewhat better. Put visine eye drops in eye earlier and this burned very badly. I suggested just some plain lubricant and cool compress. She will go to urgent care tomorrow if no better. Also questioned about her swallowing and I informed pt that this should get better in a few days.

## 2018-08-09 NOTE — H&P (Signed)
Jane Bouillon, MD [address], [address], [place], [place], [address] [address], [address], [place], [place], [address] Phone: [phone]  Fax: [phone]  Primary Care Physician:  [name], PA-C   Pre-Procedure History & Physical: HPI:  Jane [name] is a 57 y.o. female is here for a colonoscopy and EGD.   Past Medical History:  Diagnosis Date  . Allergy   . Arthritis    Knees, feet, elbows , neck  . Asthma    mild; assoc with seasonal allergies; uses Albuterol once per year on average.  Marland Kitchen Atypical chest pain    hx   . Complication of anesthesia    nausea, last 2 surgeries nausea much less  . Fibromyalgia 12/04/2010   [facility] in [place]; Dr. Gwenevere Ghazi; followed every six months.  . Fibromyalgia   . GERD (gastroesophageal reflux disease)   . Heart murmur    Mitral valve Prolapse  . HLD (hyperlipidemia)   . Hypothyroidism post radioactive iodine   Hyperthyroidism; now secondary hypothyroidism  . MR (mitral regurgitation)    moderate; probably MVP. echo 3/10 at [facility] cardiology with normal EF, moderate MR     Past Surgical History:  Procedure Laterality Date  . COLONOSCOPY  12/04/2009   normal; Iftikhar  . inner thigh surgery    . JOINT REPLACEMENT    . KNEE ARTHROPLASTY Right 10/25/2016   Procedure: COMPUTER ASSISTED TOTAL KNEE ARTHROPLASTY;  Surgeon: Donato Heinz, MD;  Location: [facility] ORS;  Service: Orthopedics;  Laterality: Right;  . KNEE SURGERY  7/10  . 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 left foot surgery     for plantar fascitis  . novascue uterine ablation  2007  . REPLACEMENT TOTAL KNEE Left     Prior to Admission medications   Medication Sig Start Date End Date Taking? Authorizing Provider  levothyroxine (SYNTHROID, LEVOTHROID) 112 MCG tablet TAKE 1 TABLET EVERY DAY 30 MINUTES BEFORE BREAKFAST 05/01/18   [name], PA-C  meloxicam (MOBIC) 15 MG tablet Take 15 mg by mouth daily.  04/21/17   [provider]  omeprazole (PRILOSEC) 20 MG capsule Take 1 capsule (20 mg total) by mouth 2 (two) times daily before a meal. 05/01/18   Burnette, Alessandra Bevels, PA-C  PROAIR HFA 108 (90 Base) MCG/ACT inhaler INHALE 2 PUFFS INTO THE LUNGS EVERY 6 HOURS AS NEEDED FOR WHEEZING, COUGH OR SHORTNESS OF BREATH 06/03/18   [name], PA-C  traMADol (ULTRAM) 50 MG tablet Take 1 tablet (50 mg total) by mouth every 12 (twelve) hours as needed. 05/01/18   [name], PA-C    Allergies as of 07/25/2018 - Review Complete 07/24/2018  Allergen Reaction Noted  . Celebrex [celecoxib] Swelling and Shortness Of Breath 10/20/2012    Family History  Problem Relation Age of Onset  . Stroke Father 61       CVA  . COPD Father   . Hyperlipidemia Mother   . Heart disease Mother 64       CABG in 46s; AMI age 71  . Hypertension Mother   . Hypertension Sister   . Breast cancer Paternal Aunt 36    Social History   Socioeconomic History  . Marital status: Married    Spouse name: Charletta Cousin  . Number of children: 0  . Years of education: Not on file  . Highest education level: Not on file  Occupational History  . Occupation: mail carrier  Social Needs  .  Financial resource strain: Not on file  . Food insecurity:    Worry: Not on file    Inability: Not on file  . Transportation needs:    Medical: Not on file    Non-medical: Not on file  Tobacco Use  . Smoking status: Former Smoker    Types: Cigarettes    Last attempt to quit: 10/12/1994    Years since quitting: 23.8  . Smokeless tobacco: Never Used  . Tobacco comment: smoked 2ppd for 15 years; quit in 1995   Substance and Sexual Activity  . Alcohol use: Yes    Alcohol/week: 0.0 standard drinks    Comment: social, rare  . Drug use: No  . Sexual activity: Yes    Birth control/protection: Post-menopausal  Lifestyle  . Physical activity:    Days per week: Not on file    Minutes per session: Not on file  . Stress: Not on file    Relationships  . Social connections:    Talks on phone: Not on file    Gets together: Not on file    Attends religious service: Not on file    Active member of club or organization: Not on file    Attends meetings of clubs or organizations: Not on file    Relationship status: Not on file  . Intimate partner violence:    Fear of current or ex partner: Not on file    Emotionally abused: Not on file    Physically abused: Not on file    Forced sexual activity: Not on file  Other Topics Concern  . Not on file  Social History Narrative   Marital status:  Married x 21 years; happily married no abuse, no children.      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      Tobacco: none      Alcohol: none      Drugs: none       Exercise:  Does not get regular exercise.       Seatbelt: 100%      Guns:  Unloaded.  Has concealed carry.    Review of Systems: See HPI, otherwise negative ROS  Physical Exam: There were no vitals taken for this visit. General:   Alert,  pleasant and cooperative in NAD Head:  Normocephalic and atraumatic. Neck:  Supple; no masses or thyromegaly. Lungs:  Clear throughout to auscultation, normal respiratory effort.    Heart:  +S1, +S2, Regular rate and rhythm, No edema. Abdomen:  Soft, nontender and nondistended. Normal bowel sounds, without guarding, and without rebound.   Neurologic:  Alert and  oriented x4;  grossly normal neurologically.  Impression/Plan: Jane [name] is here for a colonoscopy to be performed for left loqer quadrant abdominal pain and follow up of ileocecal valve ulcer on 2011 colonoscopy and EGD for midepigastric abdominal pain and dysphagia.  Risks, benefits, limitations, and alternatives regarding the procedures have been reviewed with the patient.  Questions have been answered.  All parties agreeable.   Pasty Spillers, MD  08/09/2018, 9:28 AM

## 2018-08-09 NOTE — Op Note (Signed)
[facility] Gastroenterology Patient Name: Jane [name] Procedure Date: 08/09/2018 9:52 AM MRN: [id number] Account #: [id number] Date of Birth: 1961-06-26 Admit Type: Outpatient Age: 57 Room: [facility] ENDO ROOM 2 Gender: Female Note Status: Finalized Procedure:            Upper GI endoscopy Indications:          Epigastric abdominal pain, Dysphagia Providers:            Alta Goding B. Maximino Greenland MD, MD Referring MD:         [name] (Referring MD) Medicines:            Monitored Anesthesia Care Complications:        No immediate complications. Procedure:            Pre-Anesthesia Assessment:                       - Prior to the procedure, a History and Physical was                        performed, and patient medications, allergies and                        sensitivities were reviewed. The patient's tolerance of                        previous anesthesia was reviewed.                       - The risks and benefits of the procedure and the                        sedation options and risks were discussed with the                        patient. All questions were answered and informed                        consent was obtained.                       - Patient identification and proposed procedure were                        verified prior to the procedure by the physician, the                        nurse, the anesthesiologist, the anesthetist and the                        technician. The procedure was verified in the procedure                        room.                       - ASA Grade Assessment: II - A patient with mild                        systemic disease.  After obtaining informed consent, the endoscope was                        passed under direct vision. Throughout the procedure,                        the patient's blood pressure, pulse, and oxygen                        saturations were monitored continuously. The  Endoscope                        was introduced through the mouth, and advanced to the                        second part of duodenum. The upper GI endoscopy was                        accomplished with ease. The patient tolerated the                        procedure well. Findings:      The examined esophagus was normal. Biopsies were obtained from the       proximal and distal esophagus with cold forceps for histology of       suspected eosinophilic esophagitis.      The entire examined stomach was normal. Biopsies were obtained in the       gastric body, at the incisura and in the gastric antrum with cold       forceps for histology.      Multiple 2 to 4 mm sessile polyps with no bleeding and no stigmata of       recent bleeding were found in the stomach. Biopsies were taken with a       cold forceps for histology.      The duodenal bulb, second portion of the duodenum and examined duodenum       were normal. Impression:           - Normal esophagus. Biopsied.                       - Normal stomach.                       - Multiple gastric polyps. Biopsied.                       - Normal duodenal bulb, second portion of the duodenum                        and examined duodenum.                       - Biopsies were obtained in the gastric body, at the                        incisura and in the gastric antrum. Recommendation:       - Await pathology results.                       - Discharge patient to home (with escort).                       -  Advance diet as tolerated.                       - Continue present medications.                       - Patient has a contact number available for                        emergencies. The signs and symptoms of potential                        delayed complications were discussed with the patient.                        Return to normal activities tomorrow. Written discharge                        instructions were provided to the patient.                        - Discharge patient to home (with escort).                       - The findings and recommendations were discussed with                        the patient.                       - The findings and recommendations were discussed with                        the patient's family. Procedure Code(s):    --- Professional ---                       [phone], Esophagogastroduodenoscopy, flexible, transoral;                        with biopsy, single or multiple Diagnosis Code(s):    --- Professional ---                       K31.7, Polyp of stomach and duodenum                       R10.13, Epigastric pain                       R13.10, Dysphagia, unspecified CPT copyright 2017 American Medical Association. All rights reserved. The codes documented in this report are preliminary and upon coder review may  be revised to meet current compliance requirements.  [name], MD Michel Bickers B. Maximino Greenland MD, MD 08/09/2018 10:11:51 AM This report has been signed electronically. Number of Addenda: 0 Note Initiated On: 08/09/2018 9:52 AM Estimated Blood Loss: Estimated blood loss: none.      [facility]

## 2018-08-09 NOTE — Op Note (Signed)
[facility] Gastroenterology Patient Name: Jane [name] Procedure Date: 08/09/2018 9:50 AM MRN: [id number] Account #: [id number] Date of Birth: 1961-04-16 Admit Type: Outpatient Age: 57 Room: [facility] ENDO ROOM 2 Gender: Female Note Status: Finalized Procedure:            Colonoscopy Indications:          Abdominal pain in the left lower quadrant, History of                        Ileocecal valve ulcer in 2011. Providers:            Dolphus Jenny. Maximino Greenland MD, MD Referring MD:         Mauro Kaufmann (Referring MD) Medicines:            Monitored Anesthesia Care Complications:        No immediate complications. Procedure:            Pre-Anesthesia Assessment:                       - Prior to the procedure, a History and Physical was                        performed, and patient medications, allergies and                        sensitivities were reviewed. The patient's tolerance of                        previous anesthesia was reviewed.                       - The risks and benefits of the procedure and the                        sedation options and risks were discussed with the                        patient. All questions were answered and informed                        consent was obtained.                       - Patient identification and proposed procedure were                        verified prior to the procedure by the physician, the                        nurse, the anesthetist and the technician. The                        procedure was verified in the pre-procedure area in the                        procedure room in the endoscopy suite.                       - ASA Grade Assessment: II - A patient with mild  systemic disease.                       - After reviewing the risks and benefits, the patient                        was deemed in satisfactory condition to undergo the                        procedure.  After obtaining informed consent, the colonoscope was                        passed under direct vision. Throughout the procedure,                        the patient's blood pressure, pulse, and oxygen                        saturations were monitored continuously. The                        Colonoscope was introduced through the anus and                        advanced to the the terminal ileum. The colonoscopy was                        performed with ease. The patient tolerated the                        procedure well. The quality of the bowel preparation                        was good. Findings:      The perianal and digital rectal examinations were normal.      The terminal ileum appeared normal. The terminal ileum was intubated and       was normal. The scope came out of the area before a picture could be       taken. The ileocecal valve was normal and no ulcers were seen.      Multiple diverticula were found in the sigmoid colon.      A patchy area of mildly erythematous mucosa was found in the rectum.       Biopsies were taken with a cold forceps for histology.      The exam was otherwise without abnormality.      The sigmoid colon, descending colon, transverse colon, ascending colon       and cecum appeared normal.      The retroflexed view of the distal rectum and anal verge was normal and       showed no anal or rectal abnormalities. Impression:           - The examined portion of the ileum was normal.                       - Diverticulosis in the sigmoid colon.                       - Erythematous mucosa in the rectum. Biopsied.                       -  The examination was otherwise normal.                       - The sigmoid colon, descending colon, transverse                        colon, ascending colon and cecum are normal.                       - The distal rectum and anal verge are normal on                        retroflexion view. Recommendation:       - Miralax  once daily as abdominal pain is likely due to                        constipation.                       - Discharge patient to home.                       - Resume previous diet.                       - Continue present medications.                       - Repeat colonoscopy in 10 years for screening purposes.                       - Return to primary care physician as previously                        scheduled.                       - The findings and recommendations were discussed with                        the patient.                       - The findings and recommendations were discussed with                        the patient's family.                       - High fiber diet. Procedure Code(s):    --- Professional ---                       [phone], Colonoscopy, flexible; with biopsy, single or                        multiple Diagnosis Code(s):    --- Professional ---                       K62.89, Other specified diseases of anus and rectum                       R10.32, Left lower quadrant pain  K57.30, Diverticulosis of large intestine without                        perforation or abscess without bleeding CPT copyright 2017 American Medical Association. All rights reserved. The codes documented in this report are preliminary and upon coder review may  be revised to meet current compliance requirements.  [name], MD Michel Bickers B. Maximino Greenland MD, MD 08/09/2018 10:52:37 AM This report has been signed electronically. Number of Addenda: 0 Note Initiated On: 08/09/2018 9:50 AM Scope Withdrawal Time: 0 hours 15 minutes 20 seconds  Total Procedure Duration: 0 hours 28 minutes 4 seconds  Estimated Blood Loss: Estimated blood loss: none.      [facility]

## 2018-08-09 NOTE — Anesthesia Preprocedure Evaluation (Signed)
Anesthesia Evaluation  Patient identified by MRN, date of birth, ID band Patient awake    Reviewed: Allergy & Precautions, H&P , NPO status , Patient's Chart, lab work & pertinent test results, reviewed documented beta blocker date and time   History of Anesthesia Complications (+) history of anesthetic complications  Airway Mallampati: II   Neck ROM: full    Dental  (+) Poor Dentition   Pulmonary neg pulmonary ROS, asthma , former smoker,    Pulmonary exam normal        Cardiovascular Exercise Tolerance: Good negative cardio ROS Normal cardiovascular exam+ Valvular Problems/Murmurs  Rhythm:regular Rate:Normal     Neuro/Psych Anxiety  Neuromuscular disease negative neurological ROS  negative psych ROS   GI/Hepatic negative GI ROS, Neg liver ROS, GERD  ,  Endo/Other  negative endocrine ROSHypothyroidism   Renal/GU negative Renal ROS  negative genitourinary   Musculoskeletal   Abdominal   Peds  Hematology negative hematology ROS (+)   Anesthesia Other Findings Past Medical History: No date: Allergy No date: Arthritis     Comment:  Knees, feet, elbows , neck No date: Asthma     Comment:  mild; assoc with seasonal allergies; uses Albuterol once              per year on average. No date: Atypical chest pain     Comment:  hx  No date: Complication of anesthesia     Comment:  nausea, last 2 surgeries nausea much less 12/04/2010: Fibromyalgia     Comment:  [facility] in [place]; Dr. Gwenevere Ghazi;               followed every six months. No date: Fibromyalgia No date: GERD (gastroesophageal reflux disease) No date: Heart murmur     Comment:  Mitral valve Prolapse No date: HLD (hyperlipidemia) post radioactive iodine: Hypothyroidism     Comment:  Hyperthyroidism; now secondary hypothyroidism No date: MR (mitral regurgitation)     Comment:  moderate; probably MVP. echo 3/10 at [facility] cardiology with             normal EF, moderate MR  Past Surgical History: 12/04/2009: COLONOSCOPY     Comment:  normal; Iftikhar No date: inner thigh surgery No date: JOINT REPLACEMENT 10/25/2016: KNEE ARTHROPLASTY; Right     Comment:  Procedure: COMPUTER ASSISTED TOTAL KNEE ARTHROPLASTY;                Surgeon: Donato Heinz, MD;  Location: [facility] ORS;                Service: Orthopedics;  Laterality: Right; 7/10: KNEE SURGERY 1984: LAPAROSCOPY     Comment:  for endomeriosis No date: left foot surgery     Comment:  for plantar fascitis 2007: novascue uterine ablation No date: REPLACEMENT TOTAL KNEE; Left BMI    Body Mass Index:  32.77 kg/m     Reproductive/Obstetrics negative OB ROS                             Anesthesia Physical Anesthesia Plan  ASA: III  Anesthesia Plan: General   Post-op Pain Management:    Induction:   PONV Risk Score and Plan:   Airway Management Planned:   Additional Equipment:   Intra-op Plan:   Post-operative Plan:   Informed Consent: I have reviewed the patients History and Physical, chart, labs and discussed the procedure including the risks, benefits and alternatives for  the proposed anesthesia with the patient or authorized representative who has indicated his/her understanding and acceptance.   Dental Advisory Given  Plan Discussed with: CRNA  Anesthesia Plan Comments:         Anesthesia Quick Evaluation

## 2018-08-09 NOTE — Anesthesia Procedure Notes (Signed)
Date/Time: 08/09/2018 11:52 AM Performed by: Stormy Fabian, CRNA Pre-anesthesia Checklist: Patient identified, Emergency Drugs available, Suction available and Patient being monitored Patient Re-evaluated:Patient Re-evaluated prior to induction Oxygen Delivery Method: Nasal cannula Induction Type: IV induction Dental Injury: Teeth and Oropharynx as per pre-operative assessment  Comments: Nasal cannula with etCO2 monitoring

## 2018-08-09 NOTE — Transfer of Care (Signed)
Immediate Anesthesia Transfer of Care Note  Patient: Gilda H Brouillet  Procedure(s) Performed: Procedure(s): COLONOSCOPY WITH PROPOFOL (N/A) ESOPHAGOGASTRODUODENOSCOPY (EGD) WITH PROPOFOL (N/A)  Patient Location: PACU and Endoscopy Unit  Anesthesia Type:General  Level of Consciousness: sedated  Airway & Oxygen Therapy: Patient Spontanous Breathing and Patient connected to nasal cannula oxygen  Post-op Assessment: Report given to RN and Post -op Vital signs reviewed and stable  Post vital signs: Reviewed and stable  Last Vitals:  Vitals:   08/09/18 0930 08/09/18 1049  BP: (!) 142/77 112/63  Pulse: 70 86  Resp: 16 16  Temp: (!) 36.2 C (!) 36.1 C  SpO2: 99% 95%    Complications: No apparent anesthesia complications

## 2018-08-09 NOTE — Telephone Encounter (Signed)
Endoscopy is calling to inform Dr. Maximino Greenland that pt left eye is still hurting she is putting on a cold compress and visine  They told her to come back to [facility] but pt could not due to ride.

## 2018-08-09 NOTE — Anesthesia Post-op Follow-up Note (Signed)
Anesthesia QCDR form completed.        

## 2018-08-12 ENCOUNTER — Encounter: Payer: Self-pay | Admitting: Gastroenterology

## 2018-08-12 LAB — SURGICAL PATHOLOGY

## 2018-08-12 NOTE — Anesthesia Postprocedure Evaluation (Signed)
Anesthesia Post Note  Patient: Shilah H Kohler  Procedure(s) Performed: COLONOSCOPY WITH PROPOFOL (N/A ) ESOPHAGOGASTRODUODENOSCOPY (EGD) WITH PROPOFOL (N/A )  Patient location during evaluation: PACU Anesthesia Type: General Level of consciousness: awake and alert Pain management: pain level controlled Vital Signs Assessment: post-procedure vital signs reviewed and stable Respiratory status: spontaneous breathing, nonlabored ventilation, respiratory function stable and patient connected to nasal cannula oxygen Cardiovascular status: blood pressure returned to baseline and stable Postop Assessment: no apparent nausea or vomiting Anesthetic complications: no     Last Vitals:  Vitals:   08/09/18 0930 08/09/18 1049  BP: (!) 142/77 112/63  Pulse: 70 86  Resp: 16 16  Temp: (!) 36.2 C (!) 36.1 C  SpO2: 99% 95%    Last Pain:  Vitals:   08/09/18 1119  TempSrc:   PainSc: 0-No pain                 Yevette Edwards

## 2018-08-13 ENCOUNTER — Telehealth: Payer: Self-pay | Admitting: Gastroenterology

## 2018-08-13 NOTE — Telephone Encounter (Signed)
Pt is returning Debbie's call °

## 2018-08-14 NOTE — Telephone Encounter (Signed)
I have spoke with pt.

## 2018-09-03 DIAGNOSIS — M1612 Unilateral primary osteoarthritis, left hip: Secondary | ICD-10-CM | POA: Diagnosis not present

## 2018-09-03 DIAGNOSIS — M25552 Pain in left hip: Secondary | ICD-10-CM | POA: Diagnosis not present

## 2018-09-04 ENCOUNTER — Telehealth: Payer: Self-pay | Admitting: Gastroenterology

## 2018-09-04 NOTE — Telephone Encounter (Signed)
Pt left vm she states she had a procedure a couple weeks ago and has an issue going on she needs to discuss please call pt

## 2018-09-05 DIAGNOSIS — K080 Exfoliation of teeth due to systemic causes: Secondary | ICD-10-CM | POA: Diagnosis not present

## 2018-09-09 DIAGNOSIS — K080 Exfoliation of teeth due to systemic causes: Secondary | ICD-10-CM | POA: Diagnosis not present

## 2018-09-20 DIAGNOSIS — Z1231 Encounter for screening mammogram for malignant neoplasm of breast: Secondary | ICD-10-CM | POA: Diagnosis not present

## 2018-09-23 NOTE — Telephone Encounter (Signed)
GF spoke with pt.

## 2018-10-18 DIAGNOSIS — M1612 Unilateral primary osteoarthritis, left hip: Secondary | ICD-10-CM | POA: Diagnosis not present

## 2018-10-22 ENCOUNTER — Ambulatory Visit: Payer: Self-pay | Admitting: Gastroenterology

## 2018-11-19 ENCOUNTER — Other Ambulatory Visit: Payer: Self-pay | Admitting: Physician Assistant

## 2018-11-19 DIAGNOSIS — K219 Gastro-esophageal reflux disease without esophagitis: Secondary | ICD-10-CM

## 2018-11-20 ENCOUNTER — Telehealth: Payer: Self-pay

## 2018-11-20 NOTE — Telephone Encounter (Signed)
Opened in error. KW °

## 2018-11-21 DIAGNOSIS — J029 Acute pharyngitis, unspecified: Secondary | ICD-10-CM | POA: Diagnosis not present

## 2018-11-21 DIAGNOSIS — J0140 Acute pansinusitis, unspecified: Secondary | ICD-10-CM | POA: Diagnosis not present

## 2018-11-28 ENCOUNTER — Telehealth: Payer: Self-pay | Admitting: *Deleted

## 2018-11-28 NOTE — Telephone Encounter (Signed)
Patient stated she went to [facility] last week and they gave her Augmentin for sinus infection. Patient states she is not feeling any today and now she has a sore throat. Patient wanted Jenni's opinion on what she can take otc to help with symptoms. Please advise?

## 2018-11-28 NOTE — Telephone Encounter (Signed)
Patient was advised.  

## 2018-11-28 NOTE — Telephone Encounter (Signed)
She can take Mucinex DM and floanse

## 2018-12-20 DIAGNOSIS — M9903 Segmental and somatic dysfunction of lumbar region: Secondary | ICD-10-CM | POA: Diagnosis not present

## 2018-12-20 DIAGNOSIS — M5387 Other specified dorsopathies, lumbosacral region: Secondary | ICD-10-CM | POA: Diagnosis not present

## 2019-01-14 ENCOUNTER — Telehealth: Payer: Self-pay | Admitting: Physician Assistant

## 2019-01-14 NOTE — Telephone Encounter (Signed)
Patient having issues with sinus drainage down the back of her throat.   She has taken Mucinex and the Saline nasal spray.    Tessalon Perles  at night for cough.  Every morning she wakes up coughing her head off and has a sore throat every day.   She wants to know if there is anything else she can take to help with this.

## 2019-01-14 NOTE — Telephone Encounter (Signed)
She can try Flonase or nasonex for the post nasal drainage and add claritin (loratadine).

## 2019-01-14 NOTE — Telephone Encounter (Signed)
Please advise 

## 2019-01-15 NOTE — Telephone Encounter (Signed)
Patient was advised.  

## 2019-01-24 ENCOUNTER — Ambulatory Visit: Payer: Federal, State, Local not specified - PPO | Admitting: Physician Assistant

## 2019-01-24 ENCOUNTER — Encounter: Payer: Self-pay | Admitting: Physician Assistant

## 2019-01-24 VITALS — BP 147/82 | HR 80 | Temp 97.7°F | Resp 16 | Wt 218.6 lb

## 2019-01-24 DIAGNOSIS — J029 Acute pharyngitis, unspecified: Secondary | ICD-10-CM

## 2019-01-24 DIAGNOSIS — J40 Bronchitis, not specified as acute or chronic: Secondary | ICD-10-CM

## 2019-01-24 MED ORDER — PREDNISONE 20 MG PO TABS: 20.0000 mg | ORAL_TABLET | Freq: Every day | ORAL | 0 refills | Status: DC

## 2019-01-24 MED ORDER — PSEUDOEPH-BROMPHEN-DM 30-2-10 MG/5ML PO SYRP: 5.0000 mL | ORAL_SOLUTION | Freq: Four times a day (QID) | ORAL | 0 refills | Status: DC | PRN

## 2019-01-24 MED ORDER — DOXYCYCLINE HYCLATE 100 MG PO TABS: 100.0000 mg | ORAL_TABLET | Freq: Two times a day (BID) | ORAL | 0 refills | Status: DC

## 2019-01-24 NOTE — Progress Notes (Signed)
Patient: Jane [name] Female    DOB: [birth year]   58 y.o.   MRN: [id number] Visit Date: 01/24/2019  Today's Provider: [name], PA-C   Chief Complaint  Patient presents with  . Sore Throat   Subjective:     Sore Throat   This is a new problem. The current episode started 1 to 4 weeks ago (for the past 3 weeks). The problem has been unchanged. There has been no fever. Associated symptoms include congestion, coughing, ear pain, shortness of breath (with movement) and swollen glands. Pertinent negatives include no headaches. Associated symptoms comments: When she coughs a lot she reports that she has pee on herself. Treatments tried: patient was advised last week Flonase or nasonex for the post nasal drainage and add claritin (loratadine).  The treatment provided no relief.    Allergies  Allergen Reactions  . Celebrex [Celecoxib] Swelling and Shortness Of Breath    Swelling. Developed Facial swelling      Current Outpatient Medications:  .  levothyroxine (SYNTHROID, LEVOTHROID) 112 MCG tablet, TAKE 1 TABLET EVERY DAY 30 MINUTES BEFORE BREAKFAST, Disp: 90 tablet, Rfl: 3 .  meloxicam (MOBIC) 15 MG tablet, Take 15 mg by mouth daily. , Disp: , Rfl:  .  omeprazole (PRILOSEC) 20 MG capsule, TAKE 1 CAPSULE BY MOUTH TWICE DAILY BEFORE A MEAL, Disp: 180 capsule, Rfl: 1 .  PROAIR HFA 108 (90 Base) MCG/ACT inhaler, INHALE 2 PUFFS INTO THE LUNGS EVERY 6 HOURS AS NEEDED FOR WHEEZING, COUGH OR SHORTNESS OF BREATH, Disp: 8.5 g, Rfl: 11 .  traMADol (ULTRAM) 50 MG tablet, Take 1 tablet (50 mg total) by mouth every 12 (twelve) hours as needed., Disp: 180 tablet, Rfl: 1  Review of Systems  Constitutional: Negative.   HENT: Positive for congestion, ear pain, postnasal drip and sore throat. Negative for rhinorrhea, sinus pressure and sinus pain.   Respiratory: Positive for cough and shortness of breath (with movement). Negative for chest tightness.   Cardiovascular: Negative.     Neurological: Negative for dizziness and headaches.    Social History   Tobacco Use  . Smoking status: Former Smoker    Types: Cigarettes    Last attempt to quit: 10/12/1994    Years since quitting: 24.3  . Smokeless tobacco: Never Used  . Tobacco comment: smoked 2ppd for 15 years; quit in 1995   Substance Use Topics  . Alcohol use: Yes    Alcohol/week: 0.0 standard drinks    Comment: social, rare      Objective:   BP (!) 147/82 (BP Location: Left Arm, Patient Position: Sitting, Cuff Size: Large)   Pulse 80   Temp 97.7 F (36.5 C) (Oral)   Resp 16   Wt 218 lb 9.6 oz (99.2 kg)   BMI 34.24 kg/m  Vitals:   01/24/19 1708  BP: (!) 147/82  Pulse: 80  Resp: 16  Temp: 97.7 F (36.5 C)  TempSrc: Oral  Weight: 218 lb 9.6 oz (99.2 kg)     Physical Exam Vitals signs reviewed.  Constitutional:      General: She is not in acute distress.    Appearance: She is well-developed. She is not diaphoretic.  HENT:     Head: Normocephalic and atraumatic.     Right Ear: Hearing, tympanic membrane, ear canal and external ear normal.     Left Ear: Hearing, tympanic membrane, ear canal and external ear normal.     Nose: Congestion present. No rhinorrhea.  Mouth/Throat:     Mouth: Mucous membranes are moist.     Pharynx: Uvula midline. Pharyngeal swelling and posterior oropharyngeal erythema present. No oropharyngeal exudate.     Tonsils: No tonsillar exudate or tonsillar abscesses.  Eyes:     General: No scleral icterus.       Right eye: No discharge.        Left eye: No discharge.     Conjunctiva/sclera: Conjunctivae normal.     Pupils: Pupils are equal, round, and reactive to light.  Neck:     Musculoskeletal: Normal range of motion and neck supple.     Thyroid: No thyromegaly.     Trachea: No tracheal deviation.  Cardiovascular:     Rate and Rhythm: Normal rate and regular rhythm.     Heart sounds: Normal heart sounds. No murmur. No friction rub. No gallop.   Pulmonary:      Effort: Pulmonary effort is normal. No respiratory distress.     Breath sounds: No stridor. Wheezing (fine expiratory throughout) present. No rales.  Lymphadenopathy:     Cervical: No cervical adenopathy.  Skin:    General: Skin is warm and dry.  Neurological:     Mental Status: She is alert.         Assessment & Plan    1. Pharyngitis, unspecified etiology Worsening despite OTC treatments x 2 weeks. Will treat with doxycycline as below. Continue all other medications. Call if worsening.  - doxycycline (VIBRA-TABS) 100 MG tablet; Take 1 tablet (100 mg total) by mouth 2 (two) times daily.  Dispense: 20 tablet; Refill: 0  2. Bronchitis Worsening. Will treat with prednisone and Bromfed DM. Push fluids. Rest. Call if worsening.  - predniSONE (DELTASONE) 20 MG tablet; Take 1 tablet (20 mg total) by mouth daily with breakfast.  Dispense: 5 tablet; Refill: 0 - brompheniramine-pseudoephedrine-DM 30-2-10 MG/5ML syrup; Take 5 mLs by mouth 4 (four) times daily as needed.  Dispense: 120 mL; Refill: 0     [name], PA-C  [facility]

## 2019-01-27 ENCOUNTER — Telehealth: Payer: Self-pay

## 2019-01-27 DIAGNOSIS — J40 Bronchitis, not specified as acute or chronic: Secondary | ICD-10-CM

## 2019-01-27 MED ORDER — FLUTICASONE PROPIONATE HFA 110 MCG/ACT IN AERO: 2.0000 | INHALATION_SPRAY | Freq: Every day | RESPIRATORY_TRACT | 0 refills | Status: DC

## 2019-01-27 MED ORDER — PREDNISONE 20 MG PO TABS: 20.0000 mg | ORAL_TABLET | Freq: Every day | ORAL | 0 refills | Status: DC

## 2019-01-27 NOTE — Telephone Encounter (Signed)
Patient advised as directed below. 

## 2019-01-27 NOTE — Telephone Encounter (Signed)
Patient calling that her SOB is worsening. Reports that her throat feels much better. She reports that now her mid back is hurting  And she tried to clean her bathroom yesterday and she was just felt very fatigue just by trying with her breathing. 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 Told patient that she might need to be seen again to reevaluate but I was going to ask the provider first.

## 2019-01-27 NOTE — Telephone Encounter (Signed)
Sent in more prednisone to help open airway as well as an inhaler.

## 2019-02-05 ENCOUNTER — Telehealth: Payer: Self-pay

## 2019-02-05 NOTE — Telephone Encounter (Signed)
Noted  

## 2019-02-05 NOTE — Telephone Encounter (Signed)
Patient called at 1:22 pm today during our meeting. Patient did talk to Marlyce Huge, RN from [facility]. Patient states she finished prednisone for bronchitis. She felt good when she was taking the rx but now feels bad again. Patient reports cough, shortness of breath and is feeling weak. Patient reports the RN told her she could use her inhaler every 4-5 hours, and reports that she has used inhaler and is feeling better. Patient reports she will use inhaler and call if symptoms are worsening. Patient has an appt on Wednesday 02-12-2019. sd

## 2019-02-12 ENCOUNTER — Ambulatory Visit (INDEPENDENT_AMBULATORY_CARE_PROVIDER_SITE_OTHER): Payer: Federal, State, Local not specified - PPO | Admitting: Physician Assistant

## 2019-02-12 ENCOUNTER — Ambulatory Visit
Admission: RE | Admit: 2019-02-12 | Discharge: 2019-02-12 | Disposition: A | Discharge status: Home / Self Care | Payer: Federal, State, Local not specified - PPO | Attending: Physician Assistant | Admitting: Physician Assistant

## 2019-02-12 ENCOUNTER — Other Ambulatory Visit: Payer: Self-pay

## 2019-02-12 ENCOUNTER — Telehealth: Payer: Self-pay

## 2019-02-12 ENCOUNTER — Encounter: Payer: Self-pay | Admitting: Physician Assistant

## 2019-02-12 ENCOUNTER — Ambulatory Visit
Admission: RE | Admit: 2019-02-12 | Discharge: 2019-02-12 | Disposition: A | Discharge status: Home / Self Care | Payer: Federal, State, Local not specified - PPO | Source: Ambulatory Visit | Attending: Physician Assistant | Admitting: Physician Assistant

## 2019-02-12 VITALS — BP 152/93 | HR 71 | Temp 98.0°F | Resp 16 | Ht 67.0 in | Wt 214.6 lb

## 2019-02-12 DIAGNOSIS — E6609 Other obesity due to excess calories: Secondary | ICD-10-CM

## 2019-02-12 DIAGNOSIS — E559 Vitamin D deficiency, unspecified: Secondary | ICD-10-CM

## 2019-02-12 DIAGNOSIS — M797 Fibromyalgia: Secondary | ICD-10-CM

## 2019-02-12 DIAGNOSIS — E7800 Pure hypercholesterolemia, unspecified: Secondary | ICD-10-CM | POA: Diagnosis not present

## 2019-02-12 DIAGNOSIS — Z0000 Encounter for general adult medical examination without abnormal findings: Secondary | ICD-10-CM | POA: Diagnosis not present

## 2019-02-12 DIAGNOSIS — R05 Cough: Secondary | ICD-10-CM | POA: Diagnosis not present

## 2019-02-12 DIAGNOSIS — R059 Cough, unspecified: Secondary | ICD-10-CM

## 2019-02-12 DIAGNOSIS — Z6833 Body mass index (BMI) 33.0-33.9, adult: Secondary | ICD-10-CM

## 2019-02-12 DIAGNOSIS — R0683 Snoring: Secondary | ICD-10-CM | POA: Diagnosis not present

## 2019-02-12 DIAGNOSIS — E782 Mixed hyperlipidemia: Secondary | ICD-10-CM | POA: Diagnosis not present

## 2019-02-12 DIAGNOSIS — E890 Postprocedural hypothyroidism: Secondary | ICD-10-CM

## 2019-02-12 DIAGNOSIS — R0681 Apnea, not elsewhere classified: Secondary | ICD-10-CM | POA: Diagnosis not present

## 2019-02-12 DIAGNOSIS — R7309 Other abnormal glucose: Secondary | ICD-10-CM

## 2019-02-12 DIAGNOSIS — Z1239 Encounter for other screening for malignant neoplasm of breast: Secondary | ICD-10-CM | POA: Diagnosis not present

## 2019-02-12 NOTE — Patient Instructions (Signed)
Health Maintenance for Postmenopausal Women Menopause is a normal process in which your reproductive ability comes to an end. This process happens gradually over a span of months to years, usually between the ages of 62 and 89. Menopause is complete when you have missed 12 consecutive menstrual periods. It is important to talk with your health care provider about some of the most common conditions that affect postmenopausal women, such as heart disease, cancer, and bone loss (osteoporosis). Adopting a healthy lifestyle and getting preventive care can help to promote your health and wellness. Those actions can also lower your chances of developing some of these common conditions. What should I know about menopause? During menopause, you may experience a number of symptoms, such as:  Moderate-to-severe hot flashes.  Night sweats.  Decrease in sex drive.  Mood swings.  Headaches.  Tiredness.  Irritability.  Memory problems.  Insomnia. Choosing to treat or not to treat menopausal changes is an individual decision that you make with your health care provider. What should I know about hormone replacement therapy and supplements? Hormone therapy products are effective for treating symptoms that are associated with menopause, such as hot flashes and night sweats. Hormone replacement carries certain risks, especially as you become older. If you are thinking about using estrogen or estrogen with progestin treatments, discuss the benefits and risks with your health care provider. What should I know about heart disease and stroke? Heart disease, heart attack, and stroke become more likely as you age. This may be due, in part, to the hormonal changes that your body experiences during menopause. These can affect how your body processes dietary fats, triglycerides, and cholesterol. Heart attack and stroke are both medical emergencies. There are many things that you can do to help prevent heart disease  and stroke:  Have your blood pressure checked at least every 1-2 years. High blood pressure causes heart disease and increases the risk of stroke.  If you are 79-72 years old, ask your health care provider if you should take aspirin to prevent a heart attack or a stroke.  Do not use any tobacco products, including cigarettes, chewing tobacco, or electronic cigarettes. If you need help quitting, ask your health care provider.  It is important to eat a healthy diet and maintain a healthy weight. ? Be sure to include plenty of vegetables, fruits, low-fat dairy products, and lean protein. ? Avoid eating foods that are high in solid fats, added sugars, or salt (sodium).  Get regular exercise. This is one of the most important things that you can do for your health. ? Try to exercise for at least 150 minutes each week. The type of exercise that you do should increase your heart rate and make you sweat. This is known as moderate-intensity exercise. ? Try to do strengthening exercises at least twice each week. Do these in addition to the moderate-intensity exercise.  Know your numbers.Ask your health care provider to check your cholesterol and your blood glucose. Continue to have your blood tested as directed by your health care provider.  What should I know about cancer screening? There are several types of cancer. Take the following steps to reduce your risk and to catch any cancer development as early as possible. Breast Cancer  Practice breast self-awareness. ? This means understanding how your breasts normally appear and feel. ? It also means doing regular breast self-exams. Let your health care provider know about any changes, no matter how small.  If you are 40 or  older, have a clinician do a breast exam (clinical breast exam or CBE) every year. Depending on your age, family history, and medical history, it may be recommended that you also have a yearly breast X-ray (mammogram).  If you  have a family history of breast cancer, talk with your health care provider about genetic screening.  If you are at high risk for breast cancer, talk with your health care provider about having an MRI and a mammogram every year.  Breast cancer (BRCA) gene test is recommended for women who have family members with BRCA-related cancers. Results of the assessment will determine the need for genetic counseling and BRCA1 and for BRCA2 testing. BRCA-related cancers include these types: ? Breast. This occurs in males or females. ? Ovarian. ? Tubal. This may also be called fallopian tube cancer. ? Cancer of the abdominal or pelvic lining (peritoneal cancer). ? Prostate. ? Pancreatic. Cervical, Uterine, and Ovarian Cancer Your health care provider may recommend that you be screened regularly for cancer of the pelvic organs. These include your ovaries, uterus, and vagina. This screening involves a pelvic exam, which includes checking for microscopic changes to the surface of your cervix (Pap test).  For women ages 21-65, health care providers may recommend a pelvic exam and a Pap test every three years. For women ages 39-65, they may recommend the Pap test and pelvic exam, combined with testing for human papilloma virus (HPV), every five years. Some types of HPV increase your risk of cervical cancer. Testing for HPV may also be done on women of any age who have unclear Pap test results.  Other health care providers may not recommend any screening for nonpregnant women who are considered low risk for pelvic cancer and have no symptoms. Ask your health care provider if a screening pelvic exam is right for you.  If you have had past treatment for cervical cancer or a condition that could lead to cancer, you need Pap tests and screening for cancer for at least 20 years after your treatment. If Pap tests have been discontinued for you, your risk factors (such as having a new sexual partner) need to be reassessed  to determine if you should start having screenings again. Some women have medical problems that increase the chance of getting cervical cancer. In these cases, your health care provider may recommend that you have screening and Pap tests more often.  If you have a family history of uterine cancer or ovarian cancer, talk with your health care provider about genetic screening.  If you have vaginal bleeding after reaching menopause, tell your health care provider.  There are currently no reliable tests available to screen for ovarian cancer. Lung Cancer Lung cancer screening is recommended for adults 57-50 years old who are at high risk for lung cancer because of a history of smoking. A yearly low-dose CT scan of the lungs is recommended if you:  Currently smoke.  Have a history of at least 30 pack-years of smoking and you currently smoke or have quit within the past 15 years. A pack-year is smoking an average of one pack of cigarettes per day for one year. Yearly screening should:  Continue until it has been 15 years since you quit.  Stop if you develop a health problem that would prevent you from having lung cancer treatment. Colorectal Cancer  This type of cancer can be detected and can often be prevented.  Routine colorectal cancer screening usually begins at age 12 and continues through  age 63.  If you have risk factors for colon cancer, your health care provider may recommend that you be screened at an earlier age.  If you have a family history of colorectal cancer, talk with your health care provider about genetic screening.  Your health care provider may also recommend using home test kits to check for hidden blood in your stool.  A small camera at the end of a tube can be used to examine your colon directly (sigmoidoscopy or colonoscopy). This is done to check for the earliest forms of colorectal cancer.  Direct examination of the colon should be repeated every 5-10 years until  age 75. However, if early forms of precancerous polyps or small growths are found or if you have a family history or genetic risk for colorectal cancer, you may need to be screened more often. Skin Cancer  Check your skin from head to toe regularly.  Monitor any moles. Be sure to tell your health care provider: ? About any new moles or changes in moles, especially if there is a change in a mole's shape or color. ? If you have a mole that is larger than the size of a pencil eraser.  If any of your family members has a history of skin cancer, especially at a young age, talk with your health care provider about genetic screening.  Always use sunscreen. Apply sunscreen liberally and repeatedly throughout the day.  Whenever you are outside, protect yourself by wearing long sleeves, pants, a wide-brimmed hat, and sunglasses. What should I know about osteoporosis? Osteoporosis is a condition in which bone destruction happens more quickly than new bone creation. After menopause, you may be at an increased risk for osteoporosis. To help prevent osteoporosis or the bone fractures that can happen because of osteoporosis, the following is recommended:  If you are 59-59 years old, get at least 1,000 mg of calcium and at least 600 mg of vitamin D per day.  If you are older than age 36 but younger than age 32, get at least 1,200 mg of calcium and at least 600 mg of vitamin D per day.  If you are older than age 47, get at least 1,200 mg of calcium and at least 800 mg of vitamin D per day. Smoking and excessive alcohol intake increase the risk of osteoporosis. Eat foods that are rich in calcium and vitamin D, and do weight-bearing exercises several times each week as directed by your health care provider. What should I know about how menopause affects my mental health? Depression may occur at any age, but it is more common as you become older. Common symptoms of depression include:  Low or sad mood.   Changes in sleep patterns.  Changes in appetite or eating patterns.  Feeling an overall lack of motivation or enjoyment of activities that you previously enjoyed.  Frequent crying spells. Talk with your health care provider if you think that you are experiencing depression. What should I know about immunizations? It is important that you get and maintain your immunizations. These include:  Tetanus, diphtheria, and pertussis (Tdap) booster vaccine.  Influenza every year before the flu season begins.  Pneumonia vaccine.  Shingles vaccine. Your health care provider may also recommend other immunizations. This information is not intended to replace advice given to you by your health care provider. Make sure you discuss any questions you have with your health care provider. Document Released: 01/12/2006 Document Revised: 06/09/2016 Document Reviewed: 08/24/2015 Elsevier Interactive Patient Education  2019 [place].

## 2019-02-12 NOTE — Telephone Encounter (Signed)
-----   Message from [name], [place] sent at 02/12/2019  3:15 PM EDT ----- CXR is completely normal.

## 2019-02-12 NOTE — Progress Notes (Signed)
Patient: Jane [name], Female    DOB: 08-17-1961, 58 y.o.   MRN: [id number] Visit Date: 02/12/2019  Today's Provider: [name], PA-C   Chief Complaint  Patient presents with  . Annual Exam   Subjective:     Annual physical exam Jane [name] is a 58 y.o. female who presents today for health maintenance and complete physical. She feels fairly well. She reports exercising none. She reports she is sleeping fairly well. 05/01/18 CPE 01/01/16 Pap-normal, HPV-neg; repeat in 5 years 06/12/17 Mammogram-BI-RADS 1; reports had mammogram last year through mobile unit that came to her work, no record. 08/09/18 Colonoscopy-diverticulosis, Dr. Maximino Greenland ----------------------------------------------------------------- Patient c/o recurrent cough, shortness of breath and wheezing. Patient reports she did use her inhaler for 3 days straight 3 times a day. Patient reports that she does not want to continue using inhaler daily. Patient reports that she would like to have a chest x-ray done.   Review of Systems  Constitutional: Positive for activity change and fatigue.  HENT: Negative.   Eyes: Negative.   Respiratory: Positive for shortness of breath and wheezing.   Cardiovascular: Negative.   Gastrointestinal: Negative.   Endocrine: Negative.   Genitourinary: Positive for enuresis and vaginal pain.  Musculoskeletal: Positive for arthralgias, back pain, joint swelling, neck pain and neck stiffness.  Skin: Negative.   Allergic/Immunologic: Negative.   Neurological: Negative.   Hematological: Negative.   Psychiatric/Behavioral: The patient is nervous/anxious.     Social History      She  reports that she quit smoking about 24 years ago. Her smoking use included cigarettes. She has never used smokeless tobacco. She reports current alcohol use. She reports that she does not use drugs.       Social History   Socioeconomic History  . Marital status: Married    Spouse  name: Charletta Cousin  . Number of children: 0  . Years of education: Not on file  . Highest education level: Not on file  Occupational History  . Occupation: mail carrier  Social Needs  . Financial resource strain: Not on file  . Food insecurity:    Worry: Not on file    Inability: Not on file  . Transportation needs:    Medical: Not on file    Non-medical: Not on file  Tobacco Use  . Smoking status: Former Smoker    Types: Cigarettes    Last attempt to quit: 10/12/1994    Years since quitting: 24.3  . Smokeless tobacco: Never Used  . Tobacco comment: smoked 2ppd for 15 years; quit in 1995   Substance and Sexual Activity  . Alcohol use: Yes    Alcohol/week: 0.0 standard drinks    Comment: social, rare  . Drug use: No  . Sexual activity: Yes    Birth control/protection: Post-menopausal  Lifestyle  . Physical activity:    Days per week: Not on file    Minutes per session: Not on file  . Stress: Not on file  Relationships  . Social connections:    Talks on phone: Not on file    Gets together: Not on file    Attends religious service: Not on file    Active member of club or organization: Not on file    Attends meetings of clubs or organizations: Not on file    Relationship status: Not on file  Other Topics Concern  . Not on file  Social History Narrative   Marital status:  Married x  21 years; happily married no abuse, no children.      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      Tobacco: none      Alcohol: none      Drugs: none       Exercise:  Does not get regular exercise.       Seatbelt: 100%      Guns:  Unloaded.  Has concealed carry.    Past Medical History:  Diagnosis Date  . Allergy   . Arthritis    Knees, feet, elbows , neck  . Asthma    mild; assoc with seasonal allergies; uses Albuterol once per year on average.  Marland Kitchen Atypical chest pain    hx   . Complication of anesthesia    nausea, last 2  surgeries nausea much less  . Fibromyalgia 12/04/2010   [facility] in [place]; Dr. Gwenevere Ghazi; followed every six months.  . Fibromyalgia   . GERD (gastroesophageal reflux disease)   . Heart murmur    Mitral valve Prolapse  . HLD (hyperlipidemia)   . Hypothyroidism post radioactive iodine   Hyperthyroidism; now secondary hypothyroidism  . MR (mitral regurgitation)    moderate; probably MVP. echo 3/10 at [facility] cardiology with normal EF, moderate MR      Patient Active Problem List   Diagnosis Date Noted  . Rectal inflammation   . Left lower quadrant pain   . Diverticulosis of large intestine without diverticulitis   . Gastric polyp   . Abdominal pain, epigastric   . Esophageal dysphagia   . Localized primary carpometacarpal osteoarthrosis, right 08/18/2017  . 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 Vitamin D deficiency 09/23/2016  . Hypothyroidism due to acquired atrophy of thyroid 01/01/2016  . Glucose intolerance (impaired glucose tolerance) 01/01/2016  . Class 1 obesity due to excess calories with serious comorbidity and body mass index (BMI) of 33.0 to 33.9 in adult 01/01/2016  . Gastroesophageal reflux disease without esophagitis 01/01/2016  . Asthma, mild intermittent 01/01/2016  . Anxiety state 11/09/2014  . Osteoarthritis, knee 10/20/2012  . Fibromyalgia 10/20/2012  . Hypothyroidism following radioiodine therapy 10/20/2012  . Allergic rhinitis 02/24/2010  . Hyperlipidemia 12/31/2009  . MURMUR 12/31/2009    Past Surgical History:  Procedure Laterality Date  . COLONOSCOPY  12/04/2009   normal; [facility]  . COLONOSCOPY WITH PROPOFOL N/A 08/09/2018   Procedure: COLONOSCOPY WITH PROPOFOL;  Surgeon: Pasty Spillers, MD;  Location: [facility] ENDOSCOPY;  Service: Endoscopy;  Laterality: N/A;  . ESOPHAGOGASTRODUODENOSCOPY (EGD) WITH PROPOFOL N/A 08/09/2018   Procedure: ESOPHAGOGASTRODUODENOSCOPY (EGD) WITH PROPOFOL;  Surgeon: Pasty Spillers, MD;   Location: [facility] ENDOSCOPY;  Service: Endoscopy;  Laterality: N/A;  . inner thigh surgery    . JOINT REPLACEMENT    . KNEE ARTHROPLASTY Right 10/25/2016   Procedure: COMPUTER ASSISTED TOTAL KNEE ARTHROPLASTY;  Surgeon: Donato Heinz, MD;  Location: [facility] ORS;  Service: Orthopedics;  Laterality: Right;  . KNEE SURGERY  7/10  . 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 left foot surgery     for plantar fascitis  . novascue uterine ablation  2007  . REPLACEMENT TOTAL KNEE Left     Family History        Family Status  Relation Name Status  . Father  Deceased at age 17       pneumonia; CVA, COPD  .  Mother  Deceased at age 56       CABG, MI (first at 72), HTN  . Sister  Alive       x2. HTN, smokers, overweight.   . Sister  Alive  . Emelda Brothers  Alive        Her family history includes Breast cancer (age of onset: 64) in her paternal aunt; COPD in her father; Heart disease (age of onset: 13) in her mother; Hyperlipidemia in her mother; Hypertension in her mother and sister; Stroke (age of onset: 22) in her father.      Allergies  Allergen Reactions  . Celebrex [Celecoxib] Swelling and Shortness Of Breath    Swelling. Developed Facial swelling      Current Outpatient Medications:  .  fluticasone (FLOVENT HFA) 110 MCG/ACT inhaler, Inhale 2 puffs into the lungs daily., Disp: 1 Inhaler, Rfl: 0 .  levothyroxine (SYNTHROID, LEVOTHROID) 112 MCG tablet, TAKE 1 TABLET EVERY DAY 30 MINUTES BEFORE BREAKFAST, Disp: 90 tablet, Rfl: 3 .  meloxicam (MOBIC) 15 MG tablet, Take 15 mg by mouth daily. , Disp: , Rfl:  .  omeprazole (PRILOSEC) 20 MG capsule, TAKE 1 CAPSULE BY MOUTH TWICE DAILY BEFORE A MEAL, Disp: 180 capsule, Rfl: 1 .  PROAIR HFA 108 (90 Base) MCG/ACT inhaler, INHALE 2 PUFFS INTO THE LUNGS EVERY 6 HOURS AS NEEDED FOR WHEEZING, COUGH OR SHORTNESS OF BREATH, Disp: 8.5 g, Rfl: 11 .  traMADol (ULTRAM) 50 MG tablet, Take 1 tablet (50 mg total) by mouth every 12 (twelve) hours as needed., Disp:  180 tablet, Rfl: 1   Patient Care Team: [name], PA-C as PCP - General (Family Medicine)    Objective:    Vitals: BP (!) 152/93 (BP Location: Left Arm, Patient Position: Sitting, Cuff Size: Large)   Pulse 71   Temp 98 F (36.7 C) (Oral)   Resp 16   Ht  (1.702 m)   Wt 214 lb 9.6 oz (97.3 kg)   SpO2 98%   BMI 33.61 kg/m    Vitals:   02/12/19 0909  BP: (!) 152/93  Pulse: 71  Resp: 16  Temp: 98 F (36.7 C)  TempSrc: Oral  SpO2: 98%  Weight: 214 lb 9.6 oz (97.3 kg)  Height:  (1.702 m)     Physical Exam Vitals signs reviewed.  Constitutional:      General: She is not in acute distress.    Appearance: Normal appearance. She is well-developed. She is obese. She is not ill-appearing or diaphoretic.  HENT:     Head: Normocephalic and atraumatic.     Right Ear: External ear normal.     Left Ear: External ear normal.     Nose: Nose normal.     Mouth/Throat:     Pharynx: No oropharyngeal exudate.  Eyes:     General: No scleral icterus.       Right eye: No discharge.        Left eye: No discharge.     Conjunctiva/sclera: Conjunctivae normal.     Pupils: Pupils are equal, round, and reactive to light.  Neck:     Musculoskeletal: Normal range of motion and neck supple.     Thyroid: No thyromegaly.     Vascular: No JVD.     Trachea: No tracheal deviation.  Cardiovascular:     Rate and Rhythm: Normal rate and regular rhythm.     Pulses: Normal pulses.     Heart sounds: Normal heart sounds. No  murmur. No friction rub. No gallop.   Pulmonary:     Effort: Pulmonary effort is normal. No respiratory distress.     Breath sounds: Normal breath sounds. No wheezing or rales.  Chest:     Chest wall: No tenderness.  Abdominal:     General: Bowel sounds are normal. There is no distension.     Palpations: Abdomen is soft. There is no mass.     Tenderness: There is no abdominal tenderness. There is no guarding or rebound.  Musculoskeletal: Normal range of  motion.        General: No tenderness.  Lymphadenopathy:     Cervical: No cervical adenopathy.  Skin:    General: Skin is warm and dry.     Findings: No rash.  Neurological:     General: No focal deficit present.     Mental Status: She is alert and oriented to person, place, and time. Mental status is at baseline.  Psychiatric:        Behavior: Behavior normal.        Thought Content: Thought content normal.        Judgment: Judgment normal.      Depression Screen PHQ 2/9 Scores 02/12/2019 05/24/2018 05/01/2018 03/07/2018  PHQ - 2 Score 2 0 2 2  PHQ- 9 Score 8 - 9 8       Assessment & Plan:     Routine Health Maintenance and Physical Exam  Exercise Activities and Dietary recommendations Goals   None     Immunization History  Administered Date(s) Administered  . Influenza Whole 08/04/2009  . Influenza,inj,Quad PF,6+ Mos 08/27/2013, 07/27/2014, 08/23/2016, 08/14/2017  . Influenza-Unspecified 09/04/2015, 10/02/2018  . Tdap 11/09/2014    Health Maintenance  Topic Date Due  . PAP SMEAR-Modifier  12/31/2018  . MAMMOGRAM  06/13/2019  . TETANUS/TDAP  11/09/2024  . COLONOSCOPY  08/09/2028  . INFLUENZA VACCINE  Completed  . Hepatitis C Screening  Completed  . HIV Screening  Completed     Discussed health benefits of physical activity, and encouraged her to engage in regular exercise appropriate for her age and condition.    1. Annual physical exam Normal physical exam today. Will check labs as below and f/u pending lab results. If labs are stable and WNL she will not need to have these rechecked for one year at her next annual physical exam. She is to call the office in the meantime if she has any acute issue, questions or concerns. - CBC with Differential/Platelet - Comprehensive metabolic panel - Lipid Panel With LDL/HDL Ratio - TSH - VITAMIN D 25 Hydroxy (Vit-D Deficiency, Fractures) - Hemoglobin A1c  2. Breast cancer screening Breast exam today was normal.  There is no family history of breast cancer. She does perform regular self breast exams. Mammogram was ordered as below. Information for [facility] was given to patient so she may schedule her mammogram at her convenience.  3. Hypothyroidism following radioiodine therapy Stable. Continue levothyroxine 112mcg. Will check labs as below and f/u pending results. - Comprehensive metabolic panel - TSH  4. Mixed hyperlipidemia Diet controlled. Will check labs as below and f/u pending results. - Comprehensive metabolic panel  5. Elevated hemoglobin A1c Diet controlled. Will check labs as below and f/u pending results. - Comprehensive metabolic panel - Hemoglobin A1c  6. Vitamin D deficiency H/O this. Currently not on supplement. Will check labs as below and f/u pending results. - VITAMIN D 25 Hydroxy (Vit-D Deficiency, Fractures)  7. Pure  hypercholesterolemia Diet controlled. Will check labs as below and f/u pending results. - Lipid Panel With LDL/HDL Ratio  8. Fibromyalgia Worsening. Reports she was previously followed by Rheum at [facility], but her provider left. She is interested in re-establishing with a rheumatologist because she feels her symptoms are worsening. Has used Lyrica but had adverse effects. Discussed Cymbalta but she is wanting to think about.   9. Class 1 obesity due to excess calories with serious comorbidity and body mass index (BMI) of 33.0 to 33.9 in adult Counseled patient on healthy lifestyle modifications including dieting and exercise.   10. Cough Not improving. Advised to continue inhalers. Will get CXR as below.  - DG Chest 2 View; Future  11. Snoring Worsening. Feels better to sleep upright on couch. Discussed sleep study. She wants to think about it and will check with her insurance.   12. Witnessed episode of apnea Has happened twice over last few months. See above medical treatment plan.   --------------------------------------------------------------------    [name], PA-C  [facility]

## 2019-02-12 NOTE — Telephone Encounter (Signed)
Patient advised as below.  

## 2019-02-13 ENCOUNTER — Telehealth: Payer: Self-pay | Admitting: *Deleted

## 2019-02-13 LAB — CBC WITH DIFFERENTIAL/PLATELET
BASOS: 0 %
Basophils Absolute: 0 10*3/uL (ref 0.0–0.2)
EOS (ABSOLUTE): 0.2 10*3/uL (ref 0.0–0.4)
EOS: 4 %
HEMATOCRIT: 41.3 % (ref 34.0–46.6)
Hemoglobin: 13.4 g/dL (ref 11.1–15.9)
IMMATURE GRANS (ABS): 0 10*3/uL (ref 0.0–0.1)
IMMATURE GRANULOCYTES: 0 %
Lymphocytes Absolute: 1.1 10*3/uL (ref 0.7–3.1)
Lymphs: 22 %
MCH: 27.7 pg (ref 26.6–33.0)
MCHC: 32.4 g/dL (ref 31.5–35.7)
MCV: 85 fL (ref 79–97)
Monocytes Absolute: 0.4 10*3/uL (ref 0.1–0.9)
Monocytes: 9 %
NEUTROS PCT: 65 %
Neutrophils Absolute: 3.1 10*3/uL (ref 1.4–7.0)
PLATELETS: 225 10*3/uL (ref 150–450)
RBC: 4.84 x10E6/uL (ref 3.77–5.28)
RDW: 13 % (ref 11.7–15.4)
WBC: 4.8 10*3/uL (ref 3.4–10.8)

## 2019-02-13 LAB — COMPREHENSIVE METABOLIC PANEL
ALT: 22 IU/L (ref 0–32)
AST: 21 IU/L (ref 0–40)
Albumin/Globulin Ratio: 1.7 (ref 1.2–2.2)
Albumin: 4.2 g/dL (ref 3.8–4.9)
Alkaline Phosphatase: 75 IU/L (ref 39–117)
BILIRUBIN TOTAL: 0.2 mg/dL (ref 0.0–1.2)
BUN/Creatinine Ratio: 25 — ABNORMAL HIGH (ref 9–23)
BUN: 17 mg/dL (ref 6–24)
CHLORIDE: 101 mmol/L (ref 96–106)
CO2: 25 mmol/L (ref 20–29)
CREATININE: 0.68 mg/dL (ref 0.57–1.00)
Calcium: 9.5 mg/dL (ref 8.7–10.2)
GFR calc non Af Amer: 97 mL/min/{1.73_m2} (ref >59)
GFR, EST AFRICAN AMERICAN: 112 mL/min/{1.73_m2} (ref >59)
GLUCOSE: 83 mg/dL (ref 65–99)
Globulin, Total: 2.5 g/dL (ref 1.5–4.5)
Potassium: 5.1 mmol/L (ref 3.5–5.2)
Sodium: 141 mmol/L (ref 134–144)
TOTAL PROTEIN: 6.7 g/dL (ref 6.0–8.5)

## 2019-02-13 LAB — LIPID PANEL WITH LDL/HDL RATIO
CHOLESTEROL TOTAL: 197 mg/dL (ref 100–199)
HDL: 42 mg/dL (ref >39)
LDL Calculated: 136 mg/dL — ABNORMAL HIGH (ref 0–99)
LDl/HDL Ratio: 3.2 ratio (ref 0.0–3.2)
Triglycerides: 93 mg/dL (ref 0–149)
VLDL CHOLESTEROL CAL: 19 mg/dL (ref 5–40)

## 2019-02-13 LAB — HEMOGLOBIN A1C
Est. average glucose Bld gHb Est-mCnc: 126 mg/dL
Hgb A1c MFr Bld: 6 % — ABNORMAL HIGH (ref 4.8–5.6)

## 2019-02-13 LAB — TSH: TSH: 1.32 u[IU]/mL (ref 0.450–4.500)

## 2019-02-13 LAB — VITAMIN D 25 HYDROXY (VIT D DEFICIENCY, FRACTURES): Vit D, 25-Hydroxy: 28.5 ng/mL — ABNORMAL LOW (ref 30.0–100.0)

## 2019-02-13 NOTE — Telephone Encounter (Signed)
LMOVM for pt to return call 

## 2019-02-13 NOTE — Telephone Encounter (Signed)
-----   Message from [name], [place] sent at 02/13/2019  2:04 PM EDT ----- Blood count is normal. Kidney and liver function are normal. Cholesterol slightly improved from last year. Thyroid is normal. Vit D borderline low. Recommend taking OTC Vit D 1000-2000 IU daily. A1c stable but still elevated at 6.0. Work on healthy lifestyle modifications with dieting and exercise.

## 2019-02-14 NOTE — Telephone Encounter (Signed)
Patient advised as below. Patient verbalizes understanding and is in agreement with treatment plan.  

## 2019-02-19 ENCOUNTER — Telehealth: Payer: Self-pay | Admitting: Physician Assistant

## 2019-02-19 NOTE — Telephone Encounter (Signed)
She can work if she is feeling better. Use standard precautions, good hand hygiene, cough/sneeze in arm/elbow, wash hands.

## 2019-02-19 NOTE — Telephone Encounter (Signed)
°  Pt needing to discuss with a nurse to relay to [facility] if she still needs to continue working due to her recent diagnosis of bronchitis if she contracts the Coronavirus?  Please advise.  Thanks, [employer]

## 2019-02-19 NOTE — Telephone Encounter (Signed)
Pt.Advised. KW 

## 2019-02-19 NOTE — Telephone Encounter (Signed)
lmtcb

## 2019-04-22 ENCOUNTER — Other Ambulatory Visit: Payer: Self-pay | Admitting: Physician Assistant

## 2019-04-22 DIAGNOSIS — E890 Postprocedural hypothyroidism: Secondary | ICD-10-CM

## 2019-05-14 ENCOUNTER — Other Ambulatory Visit: Payer: Self-pay | Admitting: Physician Assistant

## 2019-05-14 DIAGNOSIS — K219 Gastro-esophageal reflux disease without esophagitis: Secondary | ICD-10-CM

## 2019-05-22 ENCOUNTER — Telehealth: Payer: Self-pay

## 2019-05-22 DIAGNOSIS — M545 Low back pain, unspecified: Secondary | ICD-10-CM

## 2019-05-22 MED ORDER — CYCLOBENZAPRINE HCL 5 MG PO TABS: 5.0000 mg | ORAL_TABLET | Freq: Three times a day (TID) | ORAL | 1 refills | Status: DC | PRN

## 2019-05-22 NOTE — Telephone Encounter (Signed)
Flexeril sent in

## 2019-05-22 NOTE — Telephone Encounter (Signed)
Patient called stating Tuesday her back went out and caused her to have excruciating pain. Patient had a prednisone taper pack 4mg  tablet at home and started to take it. Patient has had some relief since starting prednisone, but is still having some back pain. Patient wants to know if there is something she can take in addition to prednisone. Please advise.  [facility].

## 2019-05-22 NOTE — Telephone Encounter (Signed)
Patient advised as directed below. 

## 2019-06-12 ENCOUNTER — Telehealth: Payer: Self-pay

## 2019-06-12 DIAGNOSIS — R0602 Shortness of breath: Secondary | ICD-10-CM

## 2019-06-12 DIAGNOSIS — J4540 Moderate persistent asthma, uncomplicated: Secondary | ICD-10-CM

## 2019-06-12 NOTE — Telephone Encounter (Signed)
Please review. Thanks!  

## 2019-06-12 NOTE — Telephone Encounter (Signed)
I will place referral to pulmonologist for patient.

## 2019-06-12 NOTE — Telephone Encounter (Signed)
Patient called office requesting note be written by Tawanna Sat stating that patient did not have to wear a mask when in public. I informed patient that we would not write letter and if she was wanting letter written she would need to contact her pulmonologist or cardiologist if she had a underlying health condition that would put her at risk if she did wear mask. Patient states that she does not have a specialist she sees, patient states that she feels like she is going to pass out when wearing the mask and reports that she has shortness of breath on exertion that throws her into a "coughing fit" when she takes her mask off. I offered patient a evisit to address SOB on exertion, patient declined and wanted to know if Tawanna Sat could put in referral for her to see specialist to address symptoms? Patient can be reached at [phone]. KW

## 2019-06-13 NOTE — Telephone Encounter (Signed)
LMOVM for pt to return call 

## 2019-06-16 ENCOUNTER — Telehealth: Payer: Self-pay

## 2019-06-16 NOTE — Telephone Encounter (Signed)
LM that referral was placed.

## 2019-06-16 NOTE — Telephone Encounter (Signed)
Patient notified that referral was placed for pulmonologist.

## 2019-06-25 ENCOUNTER — Telehealth: Payer: Self-pay | Admitting: Pulmonary Disease

## 2019-06-25 DIAGNOSIS — M722 Plantar fascial fibromatosis: Secondary | ICD-10-CM | POA: Diagnosis not present

## 2019-06-25 DIAGNOSIS — M79671 Pain in right foot: Secondary | ICD-10-CM | POA: Diagnosis not present

## 2019-06-25 NOTE — Telephone Encounter (Signed)
Lm for pt to obtain length of time for sob.

## 2019-06-25 NOTE — Telephone Encounter (Signed)
Called patient for COVID-19 pre-screening for in office visit.  Have you recently traveled any where out of the local area in the last 2 weeks? No  Have you been in close contact with a person diagnosed with COVID-19 or someone awaiting results within the last 2 weeks? No  Do you currently have any of the following symptoms? If so, when did they start? Cough     Diarrhea   Joint Pain Fever      Muscle Pain   Red eyes Shortness of breath (Yes- gotten worse this year)        Abdominal pain  Vomiting Loss of smell    Rash    Sore Throat Headache    Weakness   Bruising or bleeding  No other symptoms  Okay to proceed with visit. (date)  / Needs to reschedule visit. (date)

## 2019-06-26 ENCOUNTER — Ambulatory Visit: Payer: Federal, State, Local not specified - PPO | Admitting: Pulmonary Disease

## 2019-06-26 ENCOUNTER — Encounter: Payer: Self-pay | Admitting: Pulmonary Disease

## 2019-06-26 ENCOUNTER — Other Ambulatory Visit: Payer: Self-pay

## 2019-06-26 VITALS — BP 122/88 | HR 70 | Temp 97.7°F | Ht 67.0 in | Wt 214.6 lb

## 2019-06-26 DIAGNOSIS — F410 Panic disorder [episodic paroxysmal anxiety] without agoraphobia: Secondary | ICD-10-CM | POA: Diagnosis not present

## 2019-06-26 DIAGNOSIS — K219 Gastro-esophageal reflux disease without esophagitis: Secondary | ICD-10-CM | POA: Diagnosis not present

## 2019-06-26 DIAGNOSIS — R0602 Shortness of breath: Secondary | ICD-10-CM

## 2019-06-26 MED ORDER — ALBUTEROL SULFATE HFA 108 (90 BASE) MCG/ACT IN AERS: 2.0000 | INHALATION_SPRAY | Freq: Four times a day (QID) | RESPIRATORY_TRACT | 11 refills | Status: DC | PRN

## 2019-06-26 NOTE — Progress Notes (Signed)
Subjective:    Patient ID: Jane [name], female    DOB: Dec 13, 1960, 58 y.o.   MRN: [id number]  HPI Is a 58 year old remote former smoker (quit 1995) who presents for evaluation of dyspnea.  It also appears that she wants an excuse for not wearing a mask in public during the COVID-19 pandemic.  She is kindly referred by Joycelyn ManJennifer Burnette, PA-C.  The patient is somewhat of a rambling historian but states that she has had issues with dyspnea on exertion off and on for a number of years.  She cannot tell me exactly how many years.  She says that usually resting in a cool environment helps her shortness of breath.  She states that smells and heavy exertion trigger her symptoms.  She has been told previously that she had seasonal allergies but does not recall the COPD diagnosis though there may be a diagnosis of asthma.  In any event the patient states that he recently she had an episode where she was wearing a mask at a [employer] store and became diaphoretic, with chest heaviness and palpitations.  At this point the sensation of shortness of breath or smothering ensued.  She had to go outside to have the symptoms pass and be able to take her mask off and started coughing after she took her mask off.  She has had no sputum production or hemoptysis.  The patient has not had any fevers, chills or sweats.  No sick contacts.  No known exposure to COVID-19.  Has been trying to engage in social distancing.  The patient does endorse symptoms of gastroesophageal reflux that are not well controlled despite using omeprazole.  She has a known issue with erosive and reactive gastritis noted previously on EGD as well as evidence of reflux.  Patient states that she also gets dyspnea after meals feels that she is too full to take a breath.  She does not endorse any other symptomatology in this regard.   Of note the patient states that she had been using an inhaler as needed, she does not feel that this helps her very much.   When she pulled out the inhaler she had been using it was actually Flovent and not albuterol as she thought.  Past medical history, surgical history, social history and family history have been reviewed and are as noted.  With regards to her social history she is a former smoker as noted she smoked for approximately 15 years 1-1/2 to 2 packs of cigarettes per day.  She started at age 58.  But also as noted, she has quit this habit in 1995.  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  Has recently been doing remodeling in the home and has been doing sanding and painting in the house.  She has not been wearing dust protection.   Review of Systems  Constitutional: Negative.   HENT: Negative.   Eyes: Negative.   Respiratory: Positive for cough, chest tightness and shortness of breath.   Cardiovascular: Positive for palpitations.  Gastrointestinal: Positive for abdominal distention.       Significant gastroesophageal reflux symptoms.  Dyspnea related to meals.  Endocrine: Negative.   Genitourinary: Negative.   Musculoskeletal: Positive for arthralgias.  Hematological: Negative.   Psychiatric/Behavioral: The patient is nervous/anxious.   All other systems reviewed and are negative.      Objective:   Physical Exam Vitals signs and nursing note reviewed.  Constitutional:      General: She is not in acute distress.  Appearance: Normal appearance. She is obese.  HENT:     Head: Normocephalic and atraumatic.     Right Ear: External ear normal.     Left Ear: External ear normal.     Nose:     Comments: Nose/mouth/throat not examined due to masking requirements for COVID 19. Eyes:     Extraocular Movements: Extraocular movements intact.     Conjunctiva/sclera: Conjunctivae normal.     Pupils: Pupils are equal, round, and reactive to light.  Neck:     Musculoskeletal: Neck supple.     Thyroid: No thyromegaly.     Trachea: Trachea and phonation normal.  Cardiovascular:     Rate and Rhythm: Normal rate  and regular rhythm.     Pulses: Normal pulses.     Heart sounds: Normal heart sounds.  Pulmonary:     Effort: Pulmonary effort is normal. No respiratory distress.     Breath sounds: No wheezing, rhonchi or rales.     Comments: Slightly coarse breath sounds. Abdominal:     General: There is no distension.  Musculoskeletal: Normal range of motion.     Right lower leg: No edema.     Left lower leg: No edema.  Skin:    General: Skin is warm and dry.  Neurological:     General: No focal deficit present.     Mental Status: She is alert and oriented to person, place, and time.  Psychiatric:        Mood and Affect: Mood is anxious.        Behavior: Behavior normal.     Patient had a chest x-ray performed on March 2020, this was reviewed.  Testing was done for persistent cough.  This has been independently reviewed on the film is normal.  Noted as below:   Assessment & Plan:   1.  Shortness of breath: Patient has had symptomatology for a number of years.  She may have mild underlying COPD/asthma overlap.  She has been previously diagnosed with asthma.  Her most recent symptoms however appear to be mostly related to a panic attack/reaction.  Her level of impairment appears to be very minimal to mild and would not preclude her from wearing a mask in public.  If she is unable to wear a mask she should be able to wear a face shield.  On the basis of pulmonary disease she cannot be excuse from wearing a mask as it should not cause impairment.  If she has difficulties due to claustrophobia then she will need dispensation on the basis of that but determining that is not in our purview of practice.  From the pulmonary standpoint she should be able to wear a simple mask.  Alternatively a shield could be used.  With regards to her longstanding shortness of breath, she will need PFTs and these will be ordered.  Of note, she has not been using albuterol as rescue she has been attempting to use Flovent as  rescue and this of course is not a rescue medication.  I have instructed her to discontinue Flovent as she is only using it erratically.  We will provide her with albuterol to be used as needed.  Patient also received metered-dose inhaler instruction.  2.  Gastroesophageal reflux disease: I suspect that this adds a great deal to the symptomatology of this patient.  She does endorse having issues with reflux that aggravate her symptoms as well as having issues with postprandial shortness of breath particularly when feeling full.  I have recommended that she continue use of omeprazole.  She was instructed to take it twice a day as had been originally prescribed, she had only been taking it once daily.  3.  Anxiety/panic attack: The patient was counseled with regards to relaxation techniques.   Thank you for allowing [place] to participate in this patient's care.  This chart was dictated using voice recognition software/Dragon.  Despite best efforts to proofread, errors can occur which can change the meaning.  Any change was purely unintentional.

## 2019-06-26 NOTE — Telephone Encounter (Signed)
Called and spoke to pt.  Pt stated that sob has worsen over the past 8 months, however it has been present for years.  Sob has increased over the past two days. No relief in sx with albuterol. Pt has a rental house and has been sanding baseboards and feels that sx are related to this.  Denied cough, fever, chills or sweats.   Okay to proceed with visit per Erasmo Downer and I.

## 2019-06-26 NOTE — Patient Instructions (Signed)
1.  Discontinue use of Flovent (salmon-colored inhaler)  2.  Use Pro-Air (albuterol) 2 puffs every 6 hours as needed for shortness of breath, wheezing or cough.  3.  Continue taking omeprazole twice a day.  4.  We are scheduling breathing tests.  5.  We will see you in follow-up in 4 to 6 weeks time.  Call us sooner should any new difficulties arise.

## 2019-08-12 ENCOUNTER — Telehealth: Payer: Self-pay | Admitting: Pulmonary Disease

## 2019-08-12 NOTE — Telephone Encounter (Signed)
Left message x2 for pt. 

## 2019-08-12 NOTE — Telephone Encounter (Signed)
Left message to relay date/time of covid test.  08/13/2019 [facility]

## 2019-08-13 ENCOUNTER — Other Ambulatory Visit: Payer: Self-pay

## 2019-08-13 ENCOUNTER — Other Ambulatory Visit
Admission: RE | Admit: 2019-08-13 | Discharge: 2019-08-13 | Disposition: A | Discharge status: Home / Self Care | Payer: Federal, State, Local not specified - PPO | Source: Ambulatory Visit | Attending: Pulmonary Disease | Admitting: Pulmonary Disease

## 2019-08-13 DIAGNOSIS — Z01812 Encounter for preprocedural laboratory examination: Secondary | ICD-10-CM | POA: Insufficient documentation

## 2019-08-13 DIAGNOSIS — Z20828 Contact with and (suspected) exposure to other viral communicable diseases: Secondary | ICD-10-CM | POA: Insufficient documentation

## 2019-08-13 LAB — SARS CORONAVIRUS 2 (TAT 6-24 HRS): SARS Coronavirus 2: NEGATIVE

## 2019-08-13 NOTE — Telephone Encounter (Signed)
Pt is aware of date/time of covid test and voiced her understanding.  ° °

## 2019-08-13 NOTE — Telephone Encounter (Signed)
Left message x3 on both home and mobile number on file.

## 2019-08-14 ENCOUNTER — Ambulatory Visit: Payer: Federal, State, Local not specified - PPO | Attending: Pulmonary Disease

## 2019-08-14 DIAGNOSIS — R0602 Shortness of breath: Secondary | ICD-10-CM | POA: Diagnosis not present

## 2019-08-14 MED ORDER — ALBUTEROL SULFATE (2.5 MG/3ML) 0.083% IN NEBU
2.5000 mg | INHALATION_SOLUTION | Freq: Once | RESPIRATORY_TRACT | Status: AC
  Administered 2019-08-14: 2.5 mg via RESPIRATORY_TRACT
  Filled 2019-08-14: qty 3

## 2019-08-18 ENCOUNTER — Other Ambulatory Visit: Payer: Self-pay | Admitting: Pulmonary Disease

## 2019-08-18 ENCOUNTER — Encounter: Payer: Self-pay | Admitting: Pulmonary Disease

## 2019-08-18 ENCOUNTER — Other Ambulatory Visit: Payer: Self-pay

## 2019-08-18 ENCOUNTER — Ambulatory Visit: Payer: Federal, State, Local not specified - PPO | Admitting: Pulmonary Disease

## 2019-08-18 DIAGNOSIS — R0602 Shortness of breath: Secondary | ICD-10-CM

## 2019-08-18 DIAGNOSIS — Z23 Encounter for immunization: Secondary | ICD-10-CM

## 2019-08-18 DIAGNOSIS — K219 Gastro-esophageal reflux disease without esophagitis: Secondary | ICD-10-CM

## 2019-08-18 NOTE — Progress Notes (Signed)
   Subjective:    Patient ID: Jane [name], female    DOB: 05/15/1961, 58 y.o.   MRN: [id number]  HPI    Review of Systems     Objective:   Physical Exam        Assessment & Plan:   

## 2019-08-18 NOTE — Patient Instructions (Signed)
1.  He will have an echocardiogram and a stress test done.  2.  We wrote a letter for you to be able to wear a face shield in lieu of a facemask

## 2019-08-18 NOTE — Progress Notes (Signed)
    Assessment & Plan:  1. SOB (shortness of breath) - ECHOCARDIOGRAM COMPLETE; Future - EXERCISE TOLERANCE TEST (ETT); Future  2. Gastroesophageal reflux disease, esophagitis presence not specified  3. Need for influenza vaccination   Patient Instructions  1.  He will have an echocardiogram and a stress test done.  2.  We wrote a letter for you to be able to wear a face shield in lieu of a facemask  Please note: late entry documentation due to logistical difficulties during COVID-19 pandemic. This note is filed for information purposes only, and is not intended to be used for billing, nor does it represent the full scope/nature of the visit in question.   Subjective:    HPI: Jane [name] is a 58 y.o. female presenting to the pulmonology clinic on 08/18/2019 with report of: Follow-up (pt states breathing is up & down. c/o sob with exertion, nasal congestion and non prod cough )     Outpatient Encounter Medications as of 08/18/2019  Medication Sig   [DISCONTINUED] albuterol  (PROAIR  HFA) 108 (90 Base) MCG/ACT inhaler Inhale 2 puffs into the lungs every 6 (six) hours as needed for wheezing or shortness of breath. (Patient not taking: Reported on 09/04/2019)   [DISCONTINUED] cyclobenzaprine  (FLEXERIL ) 5 MG tablet Take 1 tablet (5 mg total) by mouth 3 (three) times daily as needed for muscle spasms. (Patient not taking: Reported on 12/08/2019)   [DISCONTINUED] fluticasone  (FLOVENT  HFA) 110 MCG/ACT inhaler Inhale 2 puffs into the lungs daily. (Patient not taking: Reported on 04/05/2020)   [DISCONTINUED] levothyroxine  (SYNTHROID ) 112 MCG tablet TAKE ONE TABLET EVERY DAY 30 MINUTES BEFORE BREAKFAST   [DISCONTINUED] meloxicam  (MOBIC ) 15 MG tablet Take 15 mg by mouth daily.    [DISCONTINUED] omeprazole  (PRILOSEC) 20 MG capsule TAKE ONE CAPSULE TWICE A DAY BEFORE MEALS   [DISCONTINUED] traMADol  (ULTRAM ) 50 MG tablet Take 1 tablet (50 mg total) by mouth every 12 (twelve) hours as needed.  (Patient not taking: Reported on 09/04/2019)   No facility-administered encounter medications on file as of 08/18/2019.      Objective:   Vitals:   08/18/19 0859  BP: 128/72  Pulse: 77  Temp: (!) 97.2 F (36.2 C)  Height: 5' 7 (1.702 m)  Weight: 214 lb 12.8 oz (97.4 kg)  SpO2: 96%  TempSrc: Temporal  BMI (Calculated): 33.63     Physical exam documentation is limited by delayed entry of information.

## 2019-08-21 ENCOUNTER — Other Ambulatory Visit: Payer: Self-pay

## 2019-08-21 ENCOUNTER — Telehealth: Payer: Self-pay | Admitting: Pulmonary Disease

## 2019-08-21 NOTE — Telephone Encounter (Signed)
Pt is aware of date/time for covid test and voiced her understanding.  Pt is requesting a letter for her employer stating that she had covid test. Pt stated that with this letter, her employer will pay her for covid leave, as she can not return to work until test is resulted.   Dr. Patsey Berthold, please advise. Thanks

## 2019-08-26 ENCOUNTER — Other Ambulatory Visit: Payer: Self-pay

## 2019-08-26 ENCOUNTER — Other Ambulatory Visit
Admission: RE | Admit: 2019-08-26 | Discharge: 2019-08-26 | Disposition: A | Discharge status: Home / Self Care | Payer: Federal, State, Local not specified - PPO | Source: Ambulatory Visit | Attending: Pulmonary Disease | Admitting: Pulmonary Disease

## 2019-08-26 DIAGNOSIS — Z01812 Encounter for preprocedural laboratory examination: Secondary | ICD-10-CM | POA: Insufficient documentation

## 2019-08-26 DIAGNOSIS — Z20828 Contact with and (suspected) exposure to other viral communicable diseases: Secondary | ICD-10-CM | POA: Diagnosis not present

## 2019-08-26 LAB — SARS CORONAVIRUS 2 (TAT 6-24 HRS): SARS Coronavirus 2: NEGATIVE

## 2019-08-27 ENCOUNTER — Ambulatory Visit (HOSPITAL_BASED_OUTPATIENT_CLINIC_OR_DEPARTMENT_OTHER)
Admission: RE | Admit: 2019-08-27 | Discharge: 2019-08-27 | Disposition: A | Discharge status: Home / Self Care | Payer: Federal, State, Local not specified - PPO | Source: Ambulatory Visit | Attending: Pulmonary Disease | Admitting: Pulmonary Disease

## 2019-08-27 ENCOUNTER — Telehealth: Payer: Self-pay | Admitting: Pulmonary Disease

## 2019-08-27 ENCOUNTER — Ambulatory Visit
Admission: RE | Admit: 2019-08-27 | Discharge: 2019-08-27 | Disposition: A | Discharge status: Home / Self Care | Payer: Federal, State, Local not specified - PPO | Source: Ambulatory Visit | Attending: Pulmonary Disease | Admitting: Pulmonary Disease

## 2019-08-27 DIAGNOSIS — R0602 Shortness of breath: Secondary | ICD-10-CM

## 2019-08-27 LAB — EXERCISE TOLERANCE TEST
Estimated workload: 7 METS
Exercise duration (min): 5 min
Exercise duration (sec): 56 s
MPHR: 161 {beats}/min
Peak HR: 155 {beats}/min
Percent HR: 96 %
Rest HR: 74 {beats}/min

## 2019-08-27 NOTE — Telephone Encounter (Signed)
Please see 08/21/2019 phone note.  

## 2019-08-27 NOTE — Progress Notes (Signed)
*  PRELIMINARY RESULTS* Echocardiogram 2D Echocardiogram has been performed.  Jane [name] Jane [name] 08/27/2019, 9:15 AM

## 2019-08-27 NOTE — Telephone Encounter (Signed)
Pt is calling for update. I have attempted to contact pt and received recording that my call could not be accepted.   LG please advise.

## 2019-08-28 NOTE — Telephone Encounter (Signed)
Patient is calling back and wanted to get a note stating that she can carry her on own route. CB is [phone]

## 2019-08-28 NOTE — Telephone Encounter (Signed)
Pt is aware that we are currently awaiting response from [employer], and our office will contact her with a response.

## 2019-09-01 NOTE — Telephone Encounter (Signed)
Per LG verbally-okay to write letter.  I have spoke to pt and relayed this information. Pt stated that letter was not needed at this time.  Nothing further is needed.

## 2019-09-03 NOTE — Progress Notes (Signed)
Patient: Jane [name] Female    DOB: 09-10-1961   58 y.o.   MRN: [id number] Visit Date: 09/04/2019  Today's Provider: [name], PA-C   Chief Complaint  Patient presents with  . Follow-up   Subjective:    I,[name],RMA am acting as a Neurosurgeon for Jane Loveless, PA-C.  HPI  Patient here to discuss stress test results. Reports during exercise they told her that her BP went very high. She states otherwise they told her everything else looked ok.   Patient also c/o worsening anxiety. Reports that she can get irritable so quick. Reports that she had and old prescription of Clonazepam and took half of one.  GAD 7 : Generalized Anxiety Score 09/04/2019  Nervous, Anxious, on Edge 3  Control/stop worrying 3  Worry too much - different things 3  Trouble relaxing 2  Restless 0  Easily annoyed or irritable 2  Afraid - awful might happen 2  Total GAD 7 Score 15  Anxiety Difficulty Somewhat difficult     Allergies  Allergen Reactions  . Celebrex [Celecoxib] Swelling and Shortness Of Breath    Swelling. Developed Facial swelling      Current Outpatient Medications:  .  cyclobenzaprine (FLEXERIL) 5 MG tablet, Take 1 tablet (5 mg total) by mouth 3 (three) times daily as needed for muscle spasms., Disp: 30 tablet, Rfl: 1 .  levothyroxine (SYNTHROID) 112 MCG tablet, TAKE ONE TABLET EVERY DAY 30 MINUTES BEFORE BREAKFAST, Disp: 90 tablet, Rfl: 3 .  omeprazole (PRILOSEC) 20 MG capsule, TAKE ONE CAPSULE TWICE A DAY BEFORE MEALS, Disp: 180 capsule, Rfl: 1 .  albuterol (PROAIR HFA) 108 (90 Base) MCG/ACT inhaler, Inhale 2 puffs into the lungs every 6 (six) hours as needed for wheezing or shortness of breath. (Patient not taking: Reported on 09/04/2019), Disp: 8.5 g, Rfl: 11 .  fluticasone (FLOVENT HFA) 110 MCG/ACT inhaler, Inhale 2 puffs into the lungs daily. (Patient not taking: Reported on 09/04/2019), Disp: 1 Inhaler, Rfl: 0 .  meloxicam (MOBIC) 15 MG tablet,  Take 15 mg by mouth daily. , Disp: , Rfl:  .  traMADol (ULTRAM) 50 MG tablet, Take 1 tablet (50 mg total) by mouth every 12 (twelve) hours as needed. (Patient not taking: Reported on 09/04/2019), Disp: 180 tablet, Rfl: 1  Review of Systems  Constitutional: Negative.   Respiratory: Positive for shortness of breath.   Cardiovascular: Negative.   Gastrointestinal: Negative.   Psychiatric/Behavioral: Positive for agitation, dysphoric mood and sleep disturbance. The patient is nervous/anxious.     Social History   Tobacco Use  . Smoking status: Former Smoker    Packs/day: 2.00    Years: 15.00    Pack years: 30.00    Types: Cigarettes    Quit date: 10/12/1994    Years since quitting: 24.9  . Smokeless tobacco: Never Used  . Tobacco comment: smoked 2ppd for 15 years; quit in 1995   Substance Use Topics  . Alcohol use: Yes    Alcohol/week: 0.0 standard drinks    Comment: social, rare      Objective:   BP (!) 146/78 (BP Location: Left Arm, Patient Position: Sitting, Cuff Size: Large)   Pulse 69   Temp (!) 97.2 F (36.2 C) (Other (Comment))   Resp 16   Wt 214 lb 6.4 oz (97.3 kg)   BMI 33.58 kg/m  Vitals:   09/04/19 0958  BP: (!) 146/78  Pulse: 69  Resp: 16  Temp: (!)  97.2 F (36.2 C)  TempSrc: Other (Comment)  Weight: 214 lb 6.4 oz (97.3 kg)  Body mass index is 33.58 kg/m.   Physical Exam Vitals signs reviewed.  Constitutional:      General: She is not in acute distress.    Appearance: Normal appearance. She is well-developed. She is obese. She is not ill-appearing or diaphoretic.  Neck:     Musculoskeletal: Normal range of motion and neck supple.     Thyroid: No thyromegaly.     Vascular: No JVD.     Trachea: No tracheal deviation.  Cardiovascular:     Rate and Rhythm: Normal rate and regular rhythm.     Heart sounds: Normal heart sounds. No murmur. No friction rub. No gallop.   Pulmonary:     Effort: Pulmonary effort is normal. No respiratory distress.      Breath sounds: Normal breath sounds. No wheezing or rales.  Lymphadenopathy:     Cervical: No cervical adenopathy.  Psychiatric:        Attention and Perception: Attention and perception normal.        Mood and Affect: Mood is anxious and depressed.        Speech: Speech normal.        Behavior: Behavior normal. Behavior is cooperative.        Thought Content: Thought content normal.        Cognition and Memory: Cognition normal.        Judgment: Judgment normal.      No results found for any visits on 09/04/19.     Assessment & Plan    1. Fibromyalgia Worsening pain and anxiety. Will start duloxetine as below. Clonazepam also refilled for prn use. I will see her back in 4-6 weeks to see if duloxetine is tolerated and if dose adjustments are needed. - DULoxetine (CYMBALTA) 30 MG capsule; Start with 1 capsule x 1-2 weeks then increase to 2 capsules  Dispense: 180 capsule; Refill: 0 - clonazePAM (KLONOPIN) 0.5 MG tablet; Take 0.5-1 tablets (0.25-0.5 mg total) by mouth 2 (two) times daily as needed for anxiety.  Dispense: 60 tablet; Refill: 0  2. GAD (generalized anxiety disorder) See above medical treatment plan. - DULoxetine (CYMBALTA) 30 MG capsule; Start with 1 capsule x 1-2 weeks then increase to 2 capsules  Dispense: 180 capsule; Refill: 0 - clonazePAM (KLONOPIN) 0.5 MG tablet; Take 0.5-1 tablets (0.25-0.5 mg total) by mouth 2 (two) times daily as needed for anxiety.  Dispense: 60 tablet; Refill: 0  3. Essential hypertension Elevated and worsening with exercise. Will start losartan as below. Again will be seeing her back in 4-6 weeks to recheck and dose adjust.  - losartan (COZAAR) 50 MG tablet; Take 1 tablet (50 mg total) by mouth daily.  Dispense: 90 tablet; Refill: 0  4. Need for influenza vaccination Flu vaccine given today without complication. Patient sat upright for 15 minutes to check for adverse reaction before being released. - Flu Vaccine QUAD 36+ mos IM      Mar Daring, PA-C  [facility]

## 2019-09-04 ENCOUNTER — Ambulatory Visit: Payer: Federal, State, Local not specified - PPO | Admitting: Physician Assistant

## 2019-09-04 ENCOUNTER — Encounter: Payer: Self-pay | Admitting: Physician Assistant

## 2019-09-04 ENCOUNTER — Telehealth: Payer: Self-pay | Admitting: Pulmonary Disease

## 2019-09-04 ENCOUNTER — Other Ambulatory Visit: Payer: Self-pay

## 2019-09-04 VITALS — BP 146/78 | HR 69 | Temp 97.2°F | Resp 16 | Wt 214.4 lb

## 2019-09-04 DIAGNOSIS — Z23 Encounter for immunization: Secondary | ICD-10-CM | POA: Diagnosis not present

## 2019-09-04 DIAGNOSIS — F411 Generalized anxiety disorder: Secondary | ICD-10-CM | POA: Diagnosis not present

## 2019-09-04 DIAGNOSIS — M797 Fibromyalgia: Secondary | ICD-10-CM

## 2019-09-04 DIAGNOSIS — I10 Essential (primary) hypertension: Secondary | ICD-10-CM

## 2019-09-04 MED ORDER — CLONAZEPAM 0.5 MG PO TABS: 0.2500 mg | ORAL_TABLET | Freq: Two times a day (BID) | ORAL | 0 refills | Status: DC | PRN

## 2019-09-04 MED ORDER — LOSARTAN POTASSIUM 50 MG PO TABS: 50.0000 mg | ORAL_TABLET | Freq: Every day | ORAL | 0 refills | Status: DC

## 2019-09-04 MED ORDER — DULOXETINE HCL 30 MG PO CPEP: ORAL_CAPSULE | ORAL | 0 refills | Status: DC

## 2019-09-04 NOTE — Telephone Encounter (Signed)
Pt is aware of below recommendations and voiced her understanding. Nothing further is needed.  

## 2019-09-04 NOTE — Telephone Encounter (Signed)
Noted, glad to see her PRN

## 2019-09-04 NOTE — Patient Instructions (Signed)
Hypertension, Adult High blood pressure (hypertension) is when the force of blood pumping through the arteries is too strong. The arteries are the blood vessels that carry blood from the heart throughout the body. Hypertension forces the heart to work harder to pump blood and may cause arteries to become narrow or stiff. Untreated or uncontrolled hypertension can cause a heart attack, heart failure, a stroke, kidney disease, and other problems. A blood pressure reading consists of a higher number over a lower number. Ideally, your blood pressure should be below 120/80. The first ("top") number is called the systolic pressure. It is a measure of the pressure in your arteries as your heart beats. The second ("bottom") number is called the diastolic pressure. It is a measure of the pressure in your arteries as the heart relaxes. What are the causes? The exact cause of this condition is not known. There are some conditions that result in or are related to high blood pressure. What increases the risk? Some risk factors for high blood pressure are under your control. The following factors may make you more likely to develop this condition:  Smoking.  Having type 2 diabetes mellitus, high cholesterol, or both.  Not getting enough exercise or physical activity.  Being overweight.  Having too much fat, sugar, calories, or salt (sodium) in your diet.  Drinking too much alcohol. Some risk factors for high blood pressure may be difficult or impossible to change. Some of these factors include:  Having chronic kidney disease.  Having a family history of high blood pressure.  Age. Risk increases with age.  Race. You may be at higher risk if you are African American.  Gender. Men are at higher risk than women before age 45. After age 65, women are at higher risk than men.  Having obstructive sleep apnea.  Stress. What are the signs or symptoms? High blood pressure may not cause symptoms. Very high  blood pressure (hypertensive crisis) may cause:  Headache.  Anxiety.  Shortness of breath.  Nosebleed.  Nausea and vomiting.  Vision changes.  Severe chest pain.  Seizures. How is this diagnosed? This condition is diagnosed by measuring your blood pressure while you are seated, with your arm resting on a flat surface, your legs uncrossed, and your feet flat on the floor. The cuff of the blood pressure monitor will be placed directly against the skin of your upper arm at the level of your heart. It should be measured at least twice using the same arm. Certain conditions can cause a difference in blood pressure between your right and left arms. Certain factors can cause blood pressure readings to be lower or higher than normal for a short period of time:  When your blood pressure is higher when you are in a health care provider's office than when you are at home, this is called white coat hypertension. Most people with this condition do not need medicines.  When your blood pressure is higher at home than when you are in a health care provider's office, this is called masked hypertension. Most people with this condition may need medicines to control blood pressure. If you have a high blood pressure reading during one visit or you have normal blood pressure with other risk factors, you may be asked to:  Return on a different day to have your blood pressure checked again.  Monitor your blood pressure at home for 1 week or longer. If you are diagnosed with hypertension, you may have other blood or   imaging tests to help your health care provider understand your overall risk for other conditions. How is this treated? This condition is treated by making healthy lifestyle changes, such as eating healthy foods, exercising more, and reducing your alcohol intake. Your health care provider may prescribe medicine if lifestyle changes are not enough to get your blood pressure under control, and if:   Your systolic blood pressure is above 130.  Your diastolic blood pressure is above 80. Your personal target blood pressure may vary depending on your medical conditions, your age, and other factors. Follow these instructions at home: Eating and drinking   Eat a diet that is high in fiber and potassium, and low in sodium, added sugar, and fat. An example eating plan is called the DASH (Dietary Approaches to Stop Hypertension) diet. To eat this way: ? Eat plenty of fresh fruits and vegetables. Try to fill one half of your plate at each meal with fruits and vegetables. ? Eat whole grains, such as whole-wheat pasta, brown rice, or whole-grain bread. Fill about one fourth of your plate with whole grains. ? Eat or drink low-fat dairy products, such as skim milk or low-fat yogurt. ? Avoid fatty cuts of meat, processed or cured meats, and poultry with skin. Fill about one fourth of your plate with lean proteins, such as fish, chicken without skin, beans, eggs, or tofu. ? Avoid pre-made and processed foods. These tend to be higher in sodium, added sugar, and fat.  Reduce your daily sodium intake. Most people with hypertension should eat less than 1,500 mg of sodium a day.  Do not drink alcohol if: ? Your health care provider tells you not to drink. ? You are pregnant, may be pregnant, or are planning to become pregnant.  If you drink alcohol: ? Limit how much you use to:  0-1 drink a day for women.  0-2 drinks a day for men. ? Be aware of how much alcohol is in your drink. In the [place] one drink equals one 12 oz bottle of beer (355 mL), one 5 oz glass of wine (148 mL), or one 1 oz glass of hard liquor (44 mL). Lifestyle   Work with your health care provider to maintain a healthy body weight or to lose weight. Ask what an ideal weight is for you.  Get at least 30 minutes of exercise most days of the week. Activities may include walking, swimming, or biking.  Include exercise to strengthen  your muscles (resistance exercise), such as Pilates or lifting weights, as part of your weekly exercise routine. Try to do these types of exercises for 30 minutes at least 3 days a week.  Do not use any products that contain nicotine or tobacco, such as cigarettes, e-cigarettes, and chewing tobacco. If you need help quitting, ask your health care provider.  Monitor your blood pressure at home as told by your health care provider.  Keep all follow-up visits as told by your health care provider. This is important. Medicines  Take over-the-counter and prescription medicines only as told by your health care provider. Follow directions carefully. Blood pressure medicines must be taken as prescribed.  Do not skip doses of blood pressure medicine. Doing this puts you at risk for problems and can make the medicine less effective.  Ask your health care provider about side effects or reactions to medicines that you should watch for. Contact a health care provider if you:  Think you are having a reaction to a medicine you   are taking.  Have headaches that keep coming back (recurring).  Feel dizzy.  Have swelling in your ankles.  Have trouble with your vision. Get help right away if you:  Develop a severe headache or confusion.  Have unusual weakness or numbness.  Feel faint.  Have severe pain in your chest or abdomen.  Vomit repeatedly.  Have trouble breathing. Summary  Hypertension is when the force of blood pumping through your arteries is too strong. If this condition is not controlled, it may put you at risk for serious complications.  Your personal target blood pressure may vary depending on your medical conditions, your age, and other factors. For most people, a normal blood pressure is less than 120/80.  Hypertension is treated with lifestyle changes, medicines, or a combination of both. Lifestyle changes include losing weight, eating a healthy, low-sodium diet, exercising  more, and limiting alcohol. This information is not intended to replace advice given to you by your health care provider. Make sure you discuss any questions you have with your health care provider. Document Released: 11/20/2005 Document Revised: 07/31/2018 Document Reviewed: 07/31/2018 Elsevier Patient Education  2020 Elsevier Inc. Losartan tablets What is this medicine? LOSARTAN (loe SAR tan) is used to treat high blood pressure and to reduce the risk of stroke in certain patients. This drug also slows the progression of kidney disease in patients with diabetes. This medicine may be used for other purposes; ask your health care provider or pharmacist if you have questions. COMMON BRAND NAME(S): Cozaar What should I tell my health care provider before I take this medicine? They need to know if you have any of these conditions:  heart failure  kidney or liver disease  an unusual or allergic reaction to losartan, other medicines, foods, dyes, or preservatives  pregnant or trying to get pregnant  breast-feeding How should I use this medicine? Take this medicine by mouth with a glass of water. Follow the directions on the prescription label. This medicine can be taken with or without food. Take your doses at regular intervals. Do not take your medicine more often than directed. Talk to your pediatrician regarding the use of this medicine in children. Special care may be needed. Overdosage: If you think you have taken too much of this medicine contact a poison control center or emergency room at once. NOTE: This medicine is only for you. Do not share this medicine with others. What if I miss a dose? If you miss a dose, take it as soon as you can. If it is almost time for your next dose, take only that dose. Do not take double or extra doses. What may interact with this medicine?  blood pressure medicines  diuretics, especially triamterene, spironolactone, or amiloride  fluconazole   NSAIDs, medicines for pain and inflammation, like ibuprofen or naproxen  potassium salts or potassium supplements  rifampin This list may not describe all possible interactions. Give your health care provider a list of all the medicines, herbs, non-prescription drugs, or dietary supplements you use. Also tell them if you smoke, drink alcohol, or use illegal drugs. Some items may interact with your medicine. What should I watch for while using this medicine? Visit your doctor or health care professional for regular checks on your progress. Check your blood pressure as directed. Ask your doctor or health care professional what your blood pressure should be and when you should contact him or her. Call your doctor or health care professional if you notice an irregular or  fast heart beat. Women should inform their doctor if they wish to become pregnant or think they might be pregnant. There is a potential for serious side effects to an unborn child, particularly in the second or third trimester. Talk to your health care professional or pharmacist for more information. You may get drowsy or dizzy. Do not drive, use machinery, or do anything that needs mental alertness until you know how this drug affects you. Do not stand or sit up quickly, especially if you are an older patient. This reduces the risk of dizzy or fainting spells. Alcohol can make you more drowsy and dizzy. Avoid alcoholic drinks. Avoid salt substitutes unless you are told otherwise by your doctor or health care professional. Do not treat yourself for coughs, colds, or pain while you are taking this medicine without asking your doctor or health care professional for advice. Some ingredients may increase your blood pressure. What side effects may I notice from receiving this medicine? Side effects that you should report to your doctor or health care professional as soon as possible:  confusion, dizziness, light headedness or fainting spells   decreased amount of urine passed  difficulty breathing or swallowing, hoarseness, or tightening of the throat  fast or irregular heart beat, palpitations, or chest pain  skin rash, itching  swelling of your face, lips, tongue, hands, or feet Side effects that usually do not require medical attention (report to your doctor or health care professional if they continue or are bothersome):  cough  decreased sexual function or desire  headache  nasal congestion or stuffiness  nausea or stomach pain  sore or cramping muscles This list may not describe all possible side effects. Call your doctor for medical advice about side effects. You may report side effects to FDA at 1-800-FDA-1088. Where should I keep my medicine? Keep out of the reach of children. Store at room temperature between 15 and 30 degrees C (59 and 86 degrees F). Protect from light. Keep container tightly closed. Throw away any unused medicine after the expiration date. NOTE: This sheet is a summary. It may not cover all possible information. If you have questions about this medicine, talk to your doctor, pharmacist, or health care provider.  2020 Elsevier/Gold Standard (2008-01-31 16:42:18)

## 2019-09-04 NOTE — Telephone Encounter (Signed)
Noted,  Will send to [facility] as an [place]

## 2019-09-05 ENCOUNTER — Ambulatory Visit: Payer: Federal, State, Local not specified - PPO | Admitting: Pulmonary Disease

## 2019-09-15 ENCOUNTER — Telehealth: Payer: Self-pay | Admitting: Pulmonary Disease

## 2019-09-15 DIAGNOSIS — M65311 Trigger thumb, right thumb: Secondary | ICD-10-CM | POA: Diagnosis not present

## 2019-09-15 NOTE — Telephone Encounter (Signed)
Left message for pt

## 2019-09-15 NOTE — Telephone Encounter (Signed)
Pt is requesting a letter for her employer stating that her two previous covid test were negative.  Dr. Patsey Berthold, please advise if okay to write a letter?

## 2019-09-15 NOTE — Telephone Encounter (Signed)
Patient called back - she can back states that she can be reached at [phone] - she has a appt at 1pm - so call after that time- she states ok to leave detailed message on that phone -pr

## 2019-09-16 NOTE — Telephone Encounter (Signed)
Letter has been written.  Pt is aware and requested that letter be mailed to address on file- verified with pt.  Nothing further is needed.

## 2019-09-16 NOTE — Telephone Encounter (Signed)
Left message for pt

## 2019-09-16 NOTE — Telephone Encounter (Signed)
Ok to do so

## 2019-10-01 ENCOUNTER — Ambulatory Visit: Payer: Self-pay | Admitting: Physician Assistant

## 2019-10-27 DIAGNOSIS — M722 Plantar fascial fibromatosis: Secondary | ICD-10-CM | POA: Diagnosis not present

## 2019-10-27 DIAGNOSIS — M79671 Pain in right foot: Secondary | ICD-10-CM | POA: Diagnosis not present

## 2019-11-25 ENCOUNTER — Other Ambulatory Visit: Payer: Self-pay | Admitting: Physician Assistant

## 2019-11-25 DIAGNOSIS — M797 Fibromyalgia: Secondary | ICD-10-CM

## 2019-11-25 DIAGNOSIS — F411 Generalized anxiety disorder: Secondary | ICD-10-CM

## 2019-12-02 DIAGNOSIS — M722 Plantar fascial fibromatosis: Secondary | ICD-10-CM | POA: Diagnosis not present

## 2019-12-02 DIAGNOSIS — S93311A Subluxation of tarsal joint of right foot, initial encounter: Secondary | ICD-10-CM | POA: Diagnosis not present

## 2019-12-04 DIAGNOSIS — M65311 Trigger thumb, right thumb: Secondary | ICD-10-CM | POA: Diagnosis not present

## 2019-12-08 ENCOUNTER — Ambulatory Visit (INDEPENDENT_AMBULATORY_CARE_PROVIDER_SITE_OTHER): Payer: Federal, State, Local not specified - PPO | Admitting: Physician Assistant

## 2019-12-08 ENCOUNTER — Ambulatory Visit: Payer: Self-pay | Admitting: *Deleted

## 2019-12-08 DIAGNOSIS — R0602 Shortness of breath: Secondary | ICD-10-CM

## 2019-12-08 DIAGNOSIS — Z20828 Contact with and (suspected) exposure to other viral communicable diseases: Secondary | ICD-10-CM | POA: Diagnosis not present

## 2019-12-08 DIAGNOSIS — B349 Viral infection, unspecified: Secondary | ICD-10-CM | POA: Diagnosis not present

## 2019-12-08 NOTE — Telephone Encounter (Signed)
   Reason for Disposition . MILD difficulty breathing (e.g., minimal/no SOB at rest, SOB with walking, pulse <100)  Answer Assessment - Initial Assessment Questions 1. RESPIRATORY STATUS: "Describe your breathing?" (e.g., wheezing, shortness of breath, unable to speak, severe coughing)      SOB, felt awful yesterday,sweating 2. ONSET: "When did this breathing problem begin?"      Yesterday- patient went to grocery- she started feeling bad 3. PATTERN "Does the difficult breathing come and go, or has it been constant since it started?"      Patient SOB when talking or active 4. SEVERITY: "How bad is your breathing?" (e.g., mild, moderate, severe)    - MILD: No SOB at rest, mild SOB with walking, speaks normally in sentences, can lay down, no retractions, pulse < 100.    - MODERATE: SOB at rest, SOB with minimal exertion and prefers to sit, cannot lie down flat, speaks in phrases, mild retractions, audible wheezing, pulse 100-120.    - SEVERE: Very SOB at rest, speaks in single words, struggling to breathe, sitting hunched forward, retractions, pulse > 120      mild 5. RECURRENT SYMPTOM: "Have you had difficulty breathing before?" If so, ask: "When was the last time?" and "What happened that time?"      No energy- today she is better 6. CARDIAC HISTORY: "Do you have any history of heart disease?" (e.g., heart attack, angina, bypass surgery, angioplasty)      no 7. LUNG HISTORY: "Do you have any history of lung disease?"  (e.g., pulmonary embolus, asthma, emphysema)     Seasonal asthma 8. CAUSE: "What do you think is causing the breathing problem?"      Possible COVID 9. OTHER SYMPTOMS: "Do you have any other symptoms? (e.g., dizziness, runny nose, cough, chest pain, fever)     Fatigue, muscle pain, chills 10. PREGNANCY: "Is there any chance you are pregnant?" "When was your last menstrual period?"       n/a 11. TRAVEL: "Have you traveled out of the country in the last month?" (e.g., travel  history, exposures)       Patient works at [employer]- and is concerned.  Protocols used: CORONAVIRUS (COVID-19) DIAGNOSED OR SUSPECTED-A-AH, BREATHING DIFFICULTY-A-AH

## 2019-12-08 NOTE — Progress Notes (Signed)
Patient: Jane [name] Female    DOB: Feb 10, 1961   59 y.o.   MRN: [id number] Visit Date: 12/08/2019  Today's Provider: [name], PA-C   Chief Complaint  Patient presents with  . Shortness of Breath  . Fatigue   Subjective:     Shortness of Breath This is a new problem. The current episode started yesterday. The problem occurs rarely (since using cymbalta). The problem has been waxing and waning. Pertinent negatives include no abdominal pain, chest pain, fever, headaches, leg pain, leg swelling, neck pain, sore throat, syncope or wheezing. She has tried nothing for the symptoms.  Fatigue started yesterday morning when she woke up. Pt also noticing cold chills and sweating.    Virtual Visit via Telephone Note  I connected with Jane [name] on 12/08/19 at 10:00 AM EST by telephone and verified that I am speaking with the correct person using two identifiers.  Location: Patient: Home Provider: [facility]   I discussed the limitations, risks, security and privacy concerns of performing an evaluation and management service by telephone and the availability of in person appointments. I also discussed with the patient that there may be a patient responsible charge related to this service. The patient expressed understanding and agreed to proceed.   . Allergies  Allergen Reactions  . Celebrex [Celecoxib] Swelling and Shortness Of Breath    Swelling. Developed Facial swelling      Current Outpatient Medications:  .  DULoxetine (CYMBALTA) 30 MG capsule, START WITH 1 CAPSULE FOR 1 TO 2 WEEKS THEN INCREASE TO 2 CAPSULES, Disp: 180 capsule, Rfl: 0 .  levothyroxine (SYNTHROID) 112 MCG tablet, TAKE ONE TABLET EVERY DAY 30 MINUTES BEFORE BREAKFAST, Disp: 90 tablet, Rfl: 3 .  losartan (COZAAR) 50 MG tablet, Take 1 tablet (50 mg total) by mouth daily., Disp: 90 tablet, Rfl: 0 .  omeprazole (PRILOSEC) 20 MG capsule, TAKE ONE CAPSULE TWICE A DAY BEFORE MEALS, Disp: 180 capsule,  Rfl: 1 .  albuterol (PROAIR HFA) 108 (90 Base) MCG/ACT inhaler, Inhale 2 puffs into the lungs every 6 (six) hours as needed for wheezing or shortness of breath. (Patient not taking: Reported on 09/04/2019), Disp: 8.5 g, Rfl: 11 .  clonazePAM (KLONOPIN) 0.5 MG tablet, Take 0.5-1 tablets (0.25-0.5 mg total) by mouth 2 (two) times daily as needed for anxiety. (Patient not taking: Reported on 12/08/2019), Disp: 60 tablet, Rfl: 0 .  cyclobenzaprine (FLEXERIL) 5 MG tablet, Take 1 tablet (5 mg total) by mouth 3 (three) times daily as needed for muscle spasms. (Patient not taking: Reported on 12/08/2019), Disp: 30 tablet, Rfl: 1 .  fluticasone (FLOVENT HFA) 110 MCG/ACT inhaler, Inhale 2 puffs into the lungs daily. (Patient not taking: Reported on 09/04/2019), Disp: 1 Inhaler, Rfl: 0 .  meloxicam (MOBIC) 15 MG tablet, Take 15 mg by mouth daily. , Disp: , Rfl:  .  traMADol (ULTRAM) 50 MG tablet, Take 1 tablet (50 mg total) by mouth every 12 (twelve) hours as needed. (Patient not taking: Reported on 09/04/2019), Disp: 180 tablet, Rfl: 1  Review of Systems  Constitutional: Negative for fever.  HENT: Negative for sore throat.   Respiratory: Positive for shortness of breath. Negative for wheezing.   Cardiovascular: Negative for chest pain, leg swelling and syncope.  Gastrointestinal: Negative for abdominal pain.  Musculoskeletal: Negative for neck pain.  Neurological: Negative for headaches.    Social History   Tobacco Use  . Smoking status: Former Smoker    Packs/day:  2.00    Years: 15.00    Pack years: 30.00    Types: Cigarettes    Quit date: 10/12/1994    Years since quitting: 25.1  . Smokeless tobacco: Never Used  . Tobacco comment: smoked 2ppd for 15 years; quit in 1995   Substance Use Topics  . Alcohol use: Yes    Alcohol/week: 0.0 standard drinks    Comment: social, rare      Objective:   There were no vitals taken for this visit. There were no vitals filed for this visit.There is no height  or weight on file to calculate BMI.   Physical Exam Vitals reviewed.  Constitutional:      General: She is not in acute distress. Pulmonary:     Effort: No respiratory distress.  Neurological:     Mental Status: She is alert.      No results found for any visits on 12/08/19.     Assessment & Plan     1. SOB (shortness of breath) Discussed Covid-19 testing and how to schedule. Also discussed increasing her use of her albuterol and flovent inhalers for right now prn. Feel as if the worst symptoms she had may have been associated with anxiety as she had mild symptoms and tried to go to the grocery store and that is when the symptoms became most intense out and then having the SOB with a mask on made it so much worse. Once she was able to get home and relax the SOB has improved. Call if worsening.    I discussed the assessment and treatment plan with the patient. The patient was provided an opportunity to ask questions and all were answered. The patient agreed with the plan and demonstrated an understanding of the instructions.   The patient was advised to call back or seek an in-person evaluation if the symptoms worsen or if the condition fails to improve as anticipated.  I provided 10 minutes of non-face-to-face time during this encounter.      Mar Daring, PA-C  [facility]

## 2019-12-08 NOTE — Telephone Encounter (Signed)
FYI

## 2019-12-09 ENCOUNTER — Telehealth: Payer: Self-pay | Admitting: Physician Assistant

## 2019-12-09 ENCOUNTER — Encounter: Payer: Self-pay | Admitting: Physician Assistant

## 2019-12-09 NOTE — Telephone Encounter (Signed)
Pt called and stated that her rapid test result came back negative. Pt would like to know if she should stay out of work until regular results come back. Please advise.

## 2019-12-11 NOTE — Telephone Encounter (Signed)
She needs to stay out for 7 days from symptom onset with a negative test

## 2019-12-11 NOTE — Telephone Encounter (Signed)
Tried calling patient. Left message to call back. OK for [facility] to advise patient.  

## 2019-12-12 ENCOUNTER — Other Ambulatory Visit: Payer: Self-pay | Admitting: Physician Assistant

## 2019-12-12 DIAGNOSIS — I10 Essential (primary) hypertension: Secondary | ICD-10-CM

## 2020-03-01 ENCOUNTER — Ambulatory Visit: Payer: Federal, State, Local not specified - PPO | Admitting: Physician Assistant

## 2020-03-05 NOTE — Progress Notes (Signed)
Established patient visit   Patient: Jane [name]   DOB: March 14, 1961   59 y.o. Female  MRN: [id number] Visit Date: 03/08/2020  Today's healthcare provider: Mar Daring, PA-C  Subjective:    Chief Complaint  Patient presents with  . Medication Problem   HPI Patient presents today for a possible medication problem, patient complains of having sweats and nightmares since she has been taking Cymbalta. She did notice some improvement in her anxiety, but no change in her fibromyalgia.    Medications: Outpatient Medications Prior to Visit  Medication Sig  . DULoxetine (CYMBALTA) 30 MG capsule START WITH 1 CAPSULE FOR 1 TO 2 WEEKS THEN INCREASE TO 2 CAPSULES  . levothyroxine (SYNTHROID) 112 MCG tablet TAKE ONE TABLET EVERY DAY 30 MINUTES BEFORE BREAKFAST  . losartan (COZAAR) 50 MG tablet TAKE ONE TABLET EVERY DAY  . meloxicam (MOBIC) 15 MG tablet Take 15 mg by mouth daily.   Marland Kitchen omeprazole (PRILOSEC) 20 MG capsule TAKE ONE CAPSULE TWICE A DAY BEFORE MEALS  . albuterol (PROAIR HFA) 108 (90 Base) MCG/ACT inhaler Inhale 2 puffs into the lungs every 6 (six) hours as needed for wheezing or shortness of breath. (Patient not taking: Reported on 09/04/2019)  . clonazePAM (KLONOPIN) 0.5 MG tablet Take 0.5-1 tablets (0.25-0.5 mg total) by mouth 2 (two) times daily as needed for anxiety. (Patient not taking: Reported on 12/08/2019)  . cyclobenzaprine (FLEXERIL) 5 MG tablet Take 1 tablet (5 mg total) by mouth 3 (three) times daily as needed for muscle spasms. (Patient not taking: Reported on 12/08/2019)  . fluticasone (FLOVENT HFA) 110 MCG/ACT inhaler Inhale 2 puffs into the lungs daily. (Patient not taking: Reported on 09/04/2019)  . traMADol (ULTRAM) 50 MG tablet Take 1 tablet (50 mg total) by mouth every 12 (twelve) hours as needed. (Patient not taking: Reported on 09/04/2019)   No facility-administered medications prior to visit.    Review of Systems  Constitutional: Positive for diaphoresis  and fatigue.  Respiratory: Negative.   Cardiovascular: Negative.   Musculoskeletal: Positive for myalgias.  Neurological: Negative.   Psychiatric/Behavioral: Positive for sleep disturbance. The patient is nervous/anxious.        Objective:    BP 133/78 (BP Location: Right Arm, Patient Position: Sitting, Cuff Size: Large)   Pulse 68   Temp (!) 96.9 F (36.1 C) (Temporal)   Wt 214 lb 6.4 oz (97.3 kg)   BMI 33.58 kg/m    Physical Exam Vitals reviewed.  Constitutional:      General: She is not in acute distress.    Appearance: Normal appearance. She is well-developed. She is obese. She is not ill-appearing.  HENT:     Head: Normocephalic and atraumatic.  Pulmonary:     Effort: Pulmonary effort is normal. No respiratory distress.  Musculoskeletal:     Cervical back: Normal range of motion and neck supple.  Neurological:     Mental Status: She is alert.  Psychiatric:        Mood and Affect: Mood normal.        Behavior: Behavior normal.        Thought Content: Thought content normal.        Judgment: Judgment normal.      GAD 7 : Generalized Anxiety Score 03/08/2020 09/04/2019  Nervous, Anxious, on Edge 2 3  Control/stop worrying 1 3  Worry too much - different things 2 3  Trouble relaxing 2 2  Restless 1 0  Easily annoyed or irritable 1 2  Afraid - awful might happen 0 2  Total GAD 7 Score 9 15  Anxiety Difficulty Somewhat difficult Somewhat difficult     No results found for any visits on 03/08/20.    Assessment & Plan:    1. Essential hypertension Stable. Diagnosis pulled for medication refill. Continue current medical treatment plan. - losartan (COZAAR) 50 MG tablet; Take 1 tablet (50 mg total) by mouth daily.  Dispense: 90 tablet; Refill: 3  2. GAD (generalized anxiety disorder) Had side effects of sweating, vivid dreams, nighttime awakenings and fatigue with duloxetine (was able to get up to 60mg ). Did notice improvement in anxiety but not fibromyalgia.  Will change therapy to venlafaxine as below for better anxiety control. I will see her back in 4 weeks for her CPE and to f/u medications.  - venlafaxine XR (EFFEXOR-XR) 75 MG 24 hr capsule; Take 1 capsule (75 mg total) by mouth daily with breakfast.  Dispense: 30 capsule; Refill: 1       [facility] [phone] (phone) [phone] (fax)  [facility]

## 2020-03-08 ENCOUNTER — Encounter: Payer: Self-pay | Admitting: Physician Assistant

## 2020-03-08 ENCOUNTER — Ambulatory Visit: Payer: Federal, State, Local not specified - PPO | Admitting: Physician Assistant

## 2020-03-08 ENCOUNTER — Other Ambulatory Visit: Payer: Self-pay

## 2020-03-08 VITALS — BP 133/78 | HR 68 | Temp 96.9°F | Wt 214.4 lb

## 2020-03-08 DIAGNOSIS — I10 Essential (primary) hypertension: Secondary | ICD-10-CM | POA: Diagnosis not present

## 2020-03-08 DIAGNOSIS — F411 Generalized anxiety disorder: Secondary | ICD-10-CM

## 2020-03-08 MED ORDER — VENLAFAXINE HCL ER 75 MG PO CP24: 75.0000 mg | ORAL_CAPSULE | Freq: Every day | ORAL | 1 refills | Status: DC

## 2020-03-08 MED ORDER — LOSARTAN POTASSIUM 50 MG PO TABS: 50.0000 mg | ORAL_TABLET | Freq: Every day | ORAL | 3 refills | Status: DC

## 2020-03-08 NOTE — Patient Instructions (Signed)
Venlafaxine extended-release capsules What is this medicine? VENLAFAXINE(VEN la fax een) is used to treat depression, anxiety and panic disorder. This medicine may be used for other purposes; ask your health care provider or pharmacist if you have questions. COMMON BRAND NAME(S): Effexor XR What should I tell my health care provider before I take this medicine? They need to know if you have any of these conditions:  bleeding disorders  glaucoma  heart disease  high blood pressure  high cholesterol  kidney disease  liver disease  low levels of sodium in the blood  mania or bipolar disorder  seizures  suicidal thoughts, plans, or attempt; a previous suicide attempt by you or a family  take medicines that treat or prevent blood clots  thyroid disease  an unusual or allergic reaction to venlafaxine, desvenlafaxine, other medicines, foods, dyes, or preservatives  pregnant or trying to get pregnant  breast-feeding How should I use this medicine? Take this medicine by mouth with a full glass of water. Follow the directions on the prescription label. Do not cut, crush, or chew this medicine. Take it with food. If needed, the capsule may be carefully opened and the entire contents sprinkled on a spoonful of cool applesauce. Swallow the applesauce/pellet mixture right away without chewing and follow with a glass of water to ensure complete swallowing of the pellets. Try to take your medicine at about the same time each day. Do not take your medicine more often than directed. Do not stop taking this medicine suddenly except upon the advice of your doctor. Stopping this medicine too quickly may cause serious side effects or your condition may worsen. A special MedGuide will be given to you by the pharmacist with each prescription and refill. Be sure to read this information carefully each time. Talk to your pediatrician regarding the use of this medicine in children. Special care may be  needed. Overdosage: If you think you have taken too much of this medicine contact a poison control center or emergency room at once. NOTE: This medicine is only for you. Do not share this medicine with others. What if I miss a dose? If you miss a dose, take it as soon as you can. If it is almost time for your next dose, take only that dose. Do not take double or extra doses. What may interact with this medicine? Do not take this medicine with any of the following medications:  certain medicines for fungal infections like fluconazole, itraconazole, ketoconazole, posaconazole, voriconazole  cisapride  desvenlafaxine  dronedarone  duloxetine  levomilnacipran  linezolid  MAOIs like Carbex, Eldepryl, Marplan, Nardil, and Parnate  methylene blue (injected into a vein)  milnacipran  pimozide  thioridazine This medicine may also interact with the following medications:  amphetamines  aspirin and aspirin-like medicines  certain medicines for depression, anxiety, or psychotic disturbances  certain medicines for migraine headaches like almotriptan, eletriptan, frovatriptan, naratriptan, rizatriptan, sumatriptan, zolmitriptan  certain medicines for sleep  certain medicines that treat or prevent blood clots like dalteparin, enoxaparin, warfarin  cimetidine  clozapine  diuretics  fentanyl  furazolidone  indinavir  isoniazid  lithium  metoprolol  NSAIDS, medicines for pain and inflammation, like ibuprofen or naproxen  other medicines that prolong the QT interval (cause an abnormal heart rhythm) like dofetilide, ziprasidone  procarbazine  rasagiline  supplements like St. John's wort, kava kava, valerian  tramadol  tryptophan This list may not describe all possible interactions. Give your health care provider a list of all the medicines,   herbs, non-prescription drugs, or dietary supplements you use. Also tell them if you smoke, drink alcohol, or use illegal  drugs. Some items may interact with your medicine. What should I watch for while using this medicine? Tell your doctor if your symptoms do not get better or if they get worse. Visit your doctor or health care professional for regular checks on your progress. Because it may take several weeks to see the full effects of this medicine, it is important to continue your treatment as prescribed by your doctor. Patients and their families should watch out for new or worsening thoughts of suicide or depression. Also watch out for sudden changes in feelings such as feeling anxious, agitated, panicky, irritable, hostile, aggressive, impulsive, severely restless, overly excited and hyperactive, or not being able to sleep. If this happens, especially at the beginning of treatment or after a change in dose, call your health care professional. This medicine can cause an increase in blood pressure. Check with your doctor for instructions on monitoring your blood pressure while taking this medicine. You may get drowsy or dizzy. Do not drive, use machinery, or do anything that needs mental alertness until you know how this medicine affects you. Do not stand or sit up quickly, especially if you are an older patient. This reduces the risk of dizzy or fainting spells. Alcohol may interfere with the effect of this medicine. Avoid alcoholic drinks. Your mouth may get dry. Chewing sugarless gum, sucking hard candy and drinking plenty of water will help. Contact your doctor if the problem does not go away or is severe. What side effects may I notice from receiving this medicine? Side effects that you should report to your doctor or health care professional as soon as possible:  allergic reactions like skin rash, itching or hives, swelling of the face, lips, or tongue  anxious  breathing problems  confusion  changes in vision  chest pain  confusion  elevated mood, decreased need for sleep, racing thoughts, impulsive  behavior  eye pain  fast, irregular heartbeat  feeling faint or lightheaded, falls  feeling agitated, angry, or irritable  hallucination, loss of contact with reality  high blood pressure  loss of balance or coordination  palpitations  redness, blistering, peeling or loosening of the skin, including inside the mouth  restlessness, pacing, inability to keep still  seizures  stiff muscles  suicidal thoughts or other mood changes  trouble passing urine or change in the amount of urine  trouble sleeping  unusual bleeding or bruising  unusually weak or tired  vomiting Side effects that usually do not require medical attention (report to your doctor or health care professional if they continue or are bothersome):  change in sex drive or performance  change in appetite or weight  constipation  dizziness  dry mouth  headache  increased sweating  nausea  tired This list may not describe all possible side effects. Call your doctor for medical advice about side effects. You may report side effects to FDA at 1-800-FDA-1088. Where should I keep my medicine? Keep out of the reach of children. Store at a controlled temperature between 20 and 25 degrees C (68 degrees and 77 degrees F), in a dry place. Throw away any unused medicine after the expiration date. NOTE: This sheet is a summary. It may not cover all possible information. If you have questions about this medicine, talk to your doctor, pharmacist, or health care provider.  2020 Elsevier/Gold Standard (2018-11-12 12:06:43)  

## 2020-03-11 ENCOUNTER — Telehealth: Payer: Self-pay | Admitting: Physician Assistant

## 2020-03-11 DIAGNOSIS — H1013 Acute atopic conjunctivitis, bilateral: Secondary | ICD-10-CM

## 2020-03-11 MED ORDER — OLOPATADINE HCL 0.2 % OP SOLN: 1.0000 [drp] | Freq: Two times a day (BID) | OPHTHALMIC | 3 refills | Status: DC

## 2020-03-11 NOTE — Telephone Encounter (Signed)
Pts pharmacy advised her to call Victorino Dike and get Pataday Extra strength eye drops for her allergies/Pt should be able to get this cheaper with her insurance / please advise    [facility] - [place], [place] [address] Phone:  [phone]  Fax:  [phone]

## 2020-03-11 NOTE — Telephone Encounter (Signed)
Sent in to pharmacy.  

## 2020-03-17 ENCOUNTER — Telehealth: Payer: Self-pay

## 2020-03-17 NOTE — Telephone Encounter (Signed)
Copied from CRM [id number]. Topic: General - Other >> Mar 17, 2020 10:34 AM Marylen Ponto wrote: Reason for CRM: Pt stated her medication was changed to venlafaxine XR (EFFEXOR-XR) 75 MG 24 hr capsule and she is sleeping better however her arthritis has gotten worse. Pt stated she is afraid to take her pain medications due to a possible interaction. Pt requests call back. Cb# [phone]

## 2020-03-17 NOTE — Telephone Encounter (Signed)
Patient was advised and states she have been using the Voltaren gel. Patient states that she has Meloxicam and will started taking it. She states that she will follow up with Antony Contras in May at her appointment. FYI

## 2020-03-17 NOTE — Telephone Encounter (Signed)
Yes, tramadol has a slight interaction with effexor and if Jane [name] discontinued it, which is seems she did, she should not take it. She can take some tylenol to help with the arthritis pain for right now. She can try some topical voltaren gel to the areas as well, which is OTC. She should discuss alternatives with Jane [name] at her follow up.

## 2020-03-24 DIAGNOSIS — S93311A Subluxation of tarsal joint of right foot, initial encounter: Secondary | ICD-10-CM | POA: Diagnosis not present

## 2020-03-24 DIAGNOSIS — M722 Plantar fascial fibromatosis: Secondary | ICD-10-CM | POA: Diagnosis not present

## 2020-03-24 DIAGNOSIS — M79671 Pain in right foot: Secondary | ICD-10-CM | POA: Diagnosis not present

## 2020-04-01 DIAGNOSIS — M65311 Trigger thumb, right thumb: Secondary | ICD-10-CM | POA: Diagnosis not present

## 2020-04-02 NOTE — Progress Notes (Signed)
Complete physical exam   Patient: Jane [name]   DOB: [birth year]   59 y.o. Female  MRN: [id number]   Today's healthcare provider: Margaretann LovelessJennifer M Sherby Moncayo, PA-C   Chief Complaint  Patient presents with  . Annual Exam   Subjective    Jane [name] is a 59 y.o. female who presents today for a complete physical exam.  She reports consuming a general diet. The patient does not participate in regular exercise at present. She generally feels poorly. She reports sleeping fairly well. She does have additional problems to discuss today. Reports that she is bruising more easily. Also having some vaginal pain and bleeding. Bleeding occurs only with wiping.  HPI  Colonoscopy:08/09/2018  Past Medical History:  Diagnosis Date  . Allergy   . Arthritis    Knees, feet, elbows , neck  . Asthma    mild; assoc with seasonal allergies; uses Albuterol once per year on average.  Marland Kitchen. Atypical chest pain    hx   . Complication of anesthesia    nausea, last 2 surgeries nausea much less  . Fibromyalgia 12/04/2010   [facility] in [place]; Dr. Gwenevere GhaziNicole Keltt; followed every six months.  . Fibromyalgia   . GERD (gastroesophageal reflux disease)   . Heart murmur    Mitral valve Prolapse  . HLD (hyperlipidemia)   . Hypothyroidism post radioactive iodine   Hyperthyroidism; now secondary hypothyroidism  . MR (mitral regurgitation)    moderate; probably MVP. [facility] EF, moderate MR    Past Surgical History:  Procedure Laterality Date  . COLONOSCOPY  12/04/2009   normal; Iftikhar  . COLONOSCOPY WITH PROPOFOL N/A 08/09/2018   Procedure: COLONOSCOPY WITH PROPOFOL;  Surgeon: Pasty Spillersahiliani, Varnita B, MD;  Location: [facility] ENDOSCOPY;  Service: Endoscopy;  Laterality: N/A;  . ESOPHAGOGASTRODUODENOSCOPY (EGD) WITH PROPOFOL N/A 08/09/2018   Procedure: ESOPHAGOGASTRODUODENOSCOPY (EGD) WITH PROPOFOL;  Surgeon: Pasty Spillersahiliani, Varnita B, MD;  Location: [facility] ENDOSCOPY;  Service:  Endoscopy;  Laterality: N/A;  . inner thigh surgery    . JOINT REPLACEMENT    . KNEE ARTHROPLASTY Right 10/25/2016   Procedure: COMPUTER ASSISTED TOTAL KNEE ARTHROPLASTY;  Surgeon: Donato HeinzJames P Hooten, MD;  Location: [facility] ORS;  Service: Orthopedics;  Laterality: Right;  . KNEE SURGERY  7/10  . 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 left foot surgery     for plantar fascitis  . novascue uterine ablation  2007  . REPLACEMENT TOTAL KNEE Left    Social History   Socioeconomic History  . Marital status: Married    Spouse name: Charletta CousinBirch  . Number of children: 0  . Years of education: Not on file  . Highest education level: Not on file  Occupational History  . Occupation: mail carrier  Tobacco Use  . Smoking status: Former Smoker    Packs/day: 2.00    Years: 15.00    Pack years: 30.00    Types: Cigarettes    Quit date: 10/12/1994    Years since quitting: 25.4  . Smokeless tobacco: Never Used  . Tobacco comment: smoked 2ppd for 15 years; quit in 1995   Substance and Sexual Activity  . Alcohol use: Yes    Alcohol/week: 0.0 standard drinks    Comment: social, rare  . Drug use: No  . Sexual activity: Yes    Birth control/protection: Post-menopausal  Other Topics Concern  . Not on file  Social History Narrative   Marital status:  Married x  21 years; happily married no abuse, no children.      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      Tobacco: none      Alcohol: none      Drugs: none       Exercise:  Does not get regular exercise.       Seatbelt: 100%      Guns:  Unloaded.  Has concealed carry.   Social Determinants of Health   Financial Resource Strain:   . Difficulty of Paying Living Expenses:   Food Insecurity:   . Worried About Programme researcher, broadcasting/film/video in the Last Year:   . Barista in the Last Year:   Transportation Needs:   . Freight forwarder (Medical):   Marland Kitchen Lack of Transportation (Non-Medical):    Physical Activity:   . Days of Exercise per Week:   . Minutes of Exercise per Session:   Stress:   . Feeling of Stress :   Social Connections:   . Frequency of Communication with Friends and Family:   . Frequency of Social Gatherings with Friends and Family:   . Attends Religious Services:   . Active Member of Clubs or Organizations:   . Attends Banker Meetings:   Marland Kitchen Marital Status:   Intimate Partner Violence:   . Fear of Current or Ex-Partner:   . Emotionally Abused:   Marland Kitchen Physically Abused:   . Sexually Abused:    Family Status  Relation Name Status  . Father  Deceased at age 45       pneumonia; CVA, COPD  . Mother  Deceased at age 3       CABG, MI (first at 25), HTN  . Sister  Alive       x2. HTN, smokers, overweight.   . Sister  Alive  . Pat Aunt  Alive   Family History  Problem Relation Age of Onset  . Stroke Father 35       CVA  . COPD Father   . Hyperlipidemia Mother   . Heart disease Mother 52       CABG in 32s; AMI age 81  . Hypertension Mother   . Hypertension Sister   . Breast cancer Paternal Aunt 50   Allergies  Allergen Reactions  . Celebrex [Celecoxib] Swelling and Shortness Of Breath    Swelling. Developed Facial swelling     Patient Care Team: [name], PA-C as PCP - General (Family Medicine)   Medications: Outpatient Medications Prior to Visit  Medication Sig  . levothyroxine (SYNTHROID) 112 MCG tablet TAKE ONE TABLET EVERY DAY 30 MINUTES BEFORE BREAKFAST  . losartan (COZAAR) 50 MG tablet Take 1 tablet (50 mg total) by mouth daily.  . Olopatadine HCl (PATADAY) 0.2 % SOLN Apply 1 drop to eye in the morning and at bedtime.  Marland Kitchen omeprazole (PRILOSEC) 20 MG capsule TAKE ONE CAPSULE TWICE A DAY BEFORE MEALS  . venlafaxine XR (EFFEXOR-XR) 75 MG 24 hr capsule Take 1 capsule (75 mg total) by mouth daily with breakfast.  . albuterol (PROAIR HFA) 108 (90 Base) MCG/ACT inhaler Inhale 2 puffs into the lungs every 6 (six) hours as  needed for wheezing or shortness of breath. (Patient not taking: Reported on 09/04/2019)  . clonazePAM (KLONOPIN) 0.5 MG tablet Take 0.5-1 tablets (0.25-0.5 mg total) by mouth 2 (two) times daily  as needed for anxiety. (Patient not taking: Reported on 12/08/2019)  . cyclobenzaprine (FLEXERIL) 5 MG tablet Take 1 tablet (5 mg total) by mouth 3 (three) times daily as needed for muscle spasms. (Patient not taking: Reported on 12/08/2019)  . fluticasone (FLOVENT HFA) 110 MCG/ACT inhaler Inhale 2 puffs into the lungs daily. (Patient not taking: Reported on 04/05/2020)  . meloxicam (MOBIC) 15 MG tablet Take 15 mg by mouth daily.   . traMADol (ULTRAM) 50 MG tablet Take 50 mg by mouth every 6 (six) hours as needed.   No facility-administered medications prior to visit.    Review of Systems  Constitutional: Negative.   HENT: Positive for sinus pressure and sneezing.   Eyes: Negative.   Respiratory: Positive for shortness of breath.   Cardiovascular: Positive for palpitations ("Reports that she has always had them" But moree frequent in the last few weeks.).  Gastrointestinal: Negative.   Endocrine: Negative.   Genitourinary: Positive for vaginal pain.  Musculoskeletal: Positive for arthralgias, back pain, neck pain and neck stiffness.  Skin: Negative.   Allergic/Immunologic: Positive for environmental allergies.  Neurological: Negative.   Hematological: Bruises/bleeds easily.  Psychiatric/Behavioral: Positive for decreased concentration.       Objective    BP (!) 148/83 (BP Location: Left Arm, Patient Position: Sitting, Cuff Size: Large)   Pulse 76   Temp (!) 96.9 F (36.1 C) (Temporal)   Resp 16   Ht 5\' 7"  (1.702 m)   Wt 215 lb (97.5 kg)   BMI 33.67 kg/m     Physical Exam Vitals reviewed.  Constitutional:      General: She is not in acute distress.    Appearance: Normal appearance. She is well-developed. She is obese. She is not ill-appearing or diaphoretic.  HENT:     Head:  Normocephalic and atraumatic.     Right Ear: Hearing, tympanic membrane, ear canal and external ear normal.     Left Ear: Hearing, tympanic membrane, ear canal and external ear normal.     Mouth/Throat:     Pharynx: Uvula midline.  Eyes:     General: No scleral icterus.       Right eye: No discharge.        Left eye: No discharge.     Extraocular Movements: Extraocular movements intact.     Conjunctiva/sclera: Conjunctivae normal.     Pupils: Pupils are equal, round, and reactive to light.  Neck:     Thyroid: No thyromegaly.     Vascular: No carotid bruit or JVD.     Trachea: No tracheal deviation.  Cardiovascular:     Rate and Rhythm: Normal rate and regular rhythm.     Pulses: Normal pulses.     Heart sounds: Normal heart sounds. No murmur. No friction rub. No gallop.   Pulmonary:     Effort: Pulmonary effort is normal. No respiratory distress.     Breath sounds: Normal breath sounds. No wheezing or rales.  Chest:     Chest wall: No tenderness.     Breasts: Breasts are symmetrical.        Right: No inverted nipple, mass, nipple discharge, skin change or tenderness.        Left: No inverted nipple, mass, nipple discharge, skin change or tenderness.  Abdominal:     General: Abdomen is flat. Bowel sounds are normal. There is no distension.     Palpations: Abdomen is soft. There is no mass.     Tenderness: There is no abdominal tenderness.  There is no guarding or rebound.     Hernia: There is no hernia in the left inguinal area.  Genitourinary:    General: Normal vulva.     Exam position: Supine.     Labia:        Right: No rash, tenderness, lesion or injury.        Left: No rash, tenderness, lesion or injury.      Vagina: Normal. No signs of injury. No vaginal discharge, erythema, tenderness or bleeding.     Cervix: No cervical motion tenderness, discharge or friability.     Adnexa:        Right: No mass, tenderness or fullness.         Left: No mass, tenderness or fullness.        Rectum: Normal.     Comments: Some vaginal atrophy at opening, internal tissues are moist, pink with rugae Musculoskeletal:        General: No tenderness. Normal range of motion.     Cervical back: Normal range of motion and neck supple.     Right lower leg: No edema.     Left lower leg: No edema.  Lymphadenopathy:     Cervical: No cervical adenopathy.  Skin:    General: Skin is warm and dry.     Capillary Refill: Capillary refill takes less than 2 seconds.     Findings: No rash.  Neurological:     General: No focal deficit present.     Mental Status: She is alert and oriented to person, place, and time. Mental status is at baseline.     Cranial Nerves: No cranial nerve deficit.     Coordination: Coordination normal.     Deep Tendon Reflexes: Reflexes are normal and symmetric.  Psychiatric:        Mood and Affect: Mood normal.        Behavior: Behavior normal.        Thought Content: Thought content normal.        Judgment: Judgment normal.       Depression Screen  PHQ 2/9 Scores 04/05/2020 09/04/2019 02/12/2019  PHQ - 2 Score 2 0 2  PHQ- 9 Score 7 - 8    No results found for any visits on 04/05/20.  Assessment & Plan    Routine Health Maintenance and Physical Exam  Exercise Activities and Dietary recommendations Goals   None     Immunization History  Administered Date(s) Administered  . Influenza Whole 08/04/2009  . Influenza,inj,Quad PF,6+ Mos 08/27/2013, 07/27/2014, 08/23/2016, 08/14/2017, 09/04/2019  . Influenza-Unspecified 09/04/2015, 10/02/2018  . Tdap 11/09/2014    Health Maintenance  Topic Date Due  . COVID-19 Vaccine (1) Never done  . PAP SMEAR-Modifier  12/31/2018  . MAMMOGRAM  06/13/2019  . INFLUENZA VACCINE  07/04/2020  . TETANUS/TDAP  11/09/2024  . COLONOSCOPY  08/09/2028  . Hepatitis C Screening  Completed  . HIV Screening  Completed    Discussed health benefits of physical activity, and encouraged her to engage in regular exercise  appropriate for her age and condition.  1. Annual physical exam Normal physical exam today. Will check labs as below and f/u pending lab results. If labs are stable and WNL she will not need to have these rechecked for one year at her next annual physical exam. She is to call the office in the meantime if she has any acute issue, questions or concerns.  2. Encounter for breast cancer screening using non-mammogram modality Breast exam  today was normal. There is no family history of breast cancer. She does perform regular self breast exams. Patient declines mammogram at this time.  3. Cervical cancer screening Pap collected today. Will send as below and f/u pending results. - Cytology - PAP  4. Essential hypertension Elevated today. Increase Losartan to 100mg  as below. She can two of her 50mg  tabs until she runs out. Will check labs as below and f/u pending results. - CBC with Differential/Platelet - Comprehensive metabolic panel - Hemoglobin A1c - Lipid panel - losartan (COZAAR) 100 MG tablet; Take 1 tablet (100 mg total) by mouth daily.  Dispense: 90 tablet; Refill: 3  5. Hypothyroidism following radioiodine therapy Stable on levothyroxine . Will check labs as below and f/u pending results. - CBC with Differential/Platelet - TSH - levothyroxine (SYNTHROID) 112 MCG tablet; TAKE ONE TABLET EVERY DAY 30 MINUTES BEFORE BREAKFAST  Dispense: 90 tablet; Refill: 3  6. Glucose intolerance (impaired glucose tolerance) Will check labs as below and f/u pending results. - CBC with Differential/Platelet - Comprehensive metabolic panel - Hemoglobin A1c  7. Mixed hyperlipidemia Diet controlled. Will check labs as below and f/u pending results. - CBC with Differential/Platelet - Comprehensive metabolic panel - Hemoglobin A1c - Lipid panel  8. Class 1 obesity due to excess calories with serious comorbidity and body mass index (BMI) of 33.0 to 33.9 in adult Counseled patient on healthy  lifestyle modifications including dieting and exercise.  - CBC with Differential/Platelet - Comprehensive metabolic panel - Hemoglobin A1c - Lipid panel  9. Vitamin D deficiency H/O this and post-menopausal. Will check labs as below and f/u pending results. - CBC with Differential/Platelet - Vitamin D (25 hydroxy)  10. Atrophy of vagina Has been having dyspareunia and vaginal discomfort. Occasional bleeding when she wipes. Will try premarin cream as below. Advised to start nightly x 2 weeks then can decrease to M-W-F use.  - conjugated estrogens (PREMARIN) vaginal cream; Place 1 Applicatorful vaginally daily.  Dispense: 42.5 g; Refill: 12  11. GAD (generalized anxiety disorder) Improving on Venlafaxine XR 75mg . Will continue current dose x 1 more month. Will send mychart if dose needs to be increased to 150mg .    No follow-ups on file.     , PA-C, have reviewed all documentation for this visit. The documentation on 04/05/20 for the exam, diagnosis, procedures, and orders are all accurate and complete.    [facility] [phone] (phone) [phone] (fax)  [facility]

## 2020-04-05 ENCOUNTER — Encounter: Payer: Self-pay | Admitting: Physician Assistant

## 2020-04-05 ENCOUNTER — Other Ambulatory Visit (HOSPITAL_COMMUNITY)
Admission: RE | Admit: 2020-04-05 | Discharge: 2020-04-05 | Disposition: A | Discharge status: Home / Self Care | Payer: Federal, State, Local not specified - PPO | Source: Ambulatory Visit | Attending: Physician Assistant | Admitting: Physician Assistant

## 2020-04-05 ENCOUNTER — Ambulatory Visit (INDEPENDENT_AMBULATORY_CARE_PROVIDER_SITE_OTHER): Payer: Federal, State, Local not specified - PPO | Admitting: Physician Assistant

## 2020-04-05 ENCOUNTER — Other Ambulatory Visit: Payer: Self-pay

## 2020-04-05 VITALS — BP 148/83 | HR 76 | Temp 96.9°F | Resp 16 | Ht 67.0 in | Wt 215.0 lb

## 2020-04-05 DIAGNOSIS — Z6833 Body mass index (BMI) 33.0-33.9, adult: Secondary | ICD-10-CM

## 2020-04-05 DIAGNOSIS — Z0000 Encounter for general adult medical examination without abnormal findings: Secondary | ICD-10-CM | POA: Diagnosis not present

## 2020-04-05 DIAGNOSIS — F411 Generalized anxiety disorder: Secondary | ICD-10-CM | POA: Diagnosis not present

## 2020-04-05 DIAGNOSIS — E782 Mixed hyperlipidemia: Secondary | ICD-10-CM

## 2020-04-05 DIAGNOSIS — R7302 Impaired glucose tolerance (oral): Secondary | ICD-10-CM

## 2020-04-05 DIAGNOSIS — E559 Vitamin D deficiency, unspecified: Secondary | ICD-10-CM

## 2020-04-05 DIAGNOSIS — E890 Postprocedural hypothyroidism: Secondary | ICD-10-CM

## 2020-04-05 DIAGNOSIS — I10 Essential (primary) hypertension: Secondary | ICD-10-CM | POA: Diagnosis not present

## 2020-04-05 DIAGNOSIS — N952 Postmenopausal atrophic vaginitis: Secondary | ICD-10-CM | POA: Diagnosis not present

## 2020-04-05 DIAGNOSIS — Z124 Encounter for screening for malignant neoplasm of cervix: Secondary | ICD-10-CM

## 2020-04-05 DIAGNOSIS — E6609 Other obesity due to excess calories: Secondary | ICD-10-CM

## 2020-04-05 DIAGNOSIS — Z1239 Encounter for other screening for malignant neoplasm of breast: Secondary | ICD-10-CM | POA: Diagnosis not present

## 2020-04-05 MED ORDER — PREMARIN 0.625 MG/GM VA CREA: 1.0000 | TOPICAL_CREAM | Freq: Every day | VAGINAL | 12 refills | Status: DC

## 2020-04-05 MED ORDER — LOSARTAN POTASSIUM 100 MG PO TABS: 100.0000 mg | ORAL_TABLET | Freq: Every day | ORAL | 3 refills | Status: DC

## 2020-04-05 MED ORDER — LEVOTHYROXINE SODIUM 112 MCG PO TABS: ORAL_TABLET | ORAL | 3 refills | Status: DC

## 2020-04-05 NOTE — Patient Instructions (Signed)

## 2020-04-06 ENCOUNTER — Telehealth: Payer: Self-pay

## 2020-04-06 LAB — COMPREHENSIVE METABOLIC PANEL
ALT: 22 IU/L (ref 0–32)
AST: 20 IU/L (ref 0–40)
Albumin/Globulin Ratio: 2 (ref 1.2–2.2)
Albumin: 4.3 g/dL (ref 3.8–4.9)
Alkaline Phosphatase: 96 IU/L (ref 39–117)
BUN/Creatinine Ratio: 19 (ref 9–23)
BUN: 15 mg/dL (ref 6–24)
Bilirubin Total: <0.2 mg/dL (ref 0.0–1.2)
CO2: 24 mmol/L (ref 20–29)
Calcium: 9.3 mg/dL (ref 8.7–10.2)
Chloride: 102 mmol/L (ref 96–106)
Creatinine, Ser: 0.79 mg/dL (ref 0.57–1.00)
GFR calc Af Amer: 95 mL/min/{1.73_m2} (ref >59)
GFR calc non Af Amer: 82 mL/min/{1.73_m2} (ref >59)
Globulin, Total: 2.2 g/dL (ref 1.5–4.5)
Glucose: 100 mg/dL — ABNORMAL HIGH (ref 65–99)
Potassium: 4.9 mmol/L (ref 3.5–5.2)
Sodium: 140 mmol/L (ref 134–144)
Total Protein: 6.5 g/dL (ref 6.0–8.5)

## 2020-04-06 LAB — CBC WITH DIFFERENTIAL/PLATELET
Basophils Absolute: 0.1 10*3/uL (ref 0.0–0.2)
Basos: 1 %
EOS (ABSOLUTE): 0.2 10*3/uL (ref 0.0–0.4)
Eos: 3 %
Hematocrit: 41 % (ref 34.0–46.6)
Hemoglobin: 13.9 g/dL (ref 11.1–15.9)
Immature Grans (Abs): 0 10*3/uL (ref 0.0–0.1)
Immature Granulocytes: 0 %
Lymphocytes Absolute: 1.3 10*3/uL (ref 0.7–3.1)
Lymphs: 23 %
MCH: 29 pg (ref 26.6–33.0)
MCHC: 33.9 g/dL (ref 31.5–35.7)
MCV: 86 fL (ref 79–97)
Monocytes Absolute: 0.5 10*3/uL (ref 0.1–0.9)
Monocytes: 9 %
Neutrophils Absolute: 3.5 10*3/uL (ref 1.4–7.0)
Neutrophils: 64 %
Platelets: 237 10*3/uL (ref 150–450)
RBC: 4.79 x10E6/uL (ref 3.77–5.28)
RDW: 12.9 % (ref 11.7–15.4)
WBC: 5.5 10*3/uL (ref 3.4–10.8)

## 2020-04-06 LAB — LIPID PANEL
Chol/HDL Ratio: 5.7 ratio — ABNORMAL HIGH (ref 0.0–4.4)
Cholesterol, Total: 204 mg/dL — ABNORMAL HIGH (ref 100–199)
HDL: 36 mg/dL — ABNORMAL LOW (ref >39)
LDL Chol Calc (NIH): 134 mg/dL — ABNORMAL HIGH (ref 0–99)
Triglycerides: 186 mg/dL — ABNORMAL HIGH (ref 0–149)
VLDL Cholesterol Cal: 34 mg/dL (ref 5–40)

## 2020-04-06 LAB — HEMOGLOBIN A1C
Est. average glucose Bld gHb Est-mCnc: 123 mg/dL
Hgb A1c MFr Bld: 5.9 % — ABNORMAL HIGH (ref 4.8–5.6)

## 2020-04-06 LAB — TSH: TSH: 1.78 u[IU]/mL (ref 0.450–4.500)

## 2020-04-06 LAB — VITAMIN D 25 HYDROXY (VIT D DEFICIENCY, FRACTURES): Vit D, 25-Hydroxy: 28 ng/mL — ABNORMAL LOW (ref 30.0–100.0)

## 2020-04-06 NOTE — Telephone Encounter (Signed)
Copied from CRM [id number]. Topic: General - Other >> Apr 06, 2020  8:57 AM Elliot Gault wrote: Reason for HUD:[id number] inquiring about her most recent lab results states she was unable to view on my chart. Patient requesting a call back and please leave a detail message regarding the results

## 2020-04-06 NOTE — Telephone Encounter (Signed)
LM that results haven't been release yet and that provider need to sing them.Once she releases them we will call her with the results and she can see them on my chart.

## 2020-04-07 ENCOUNTER — Telehealth: Payer: Self-pay

## 2020-04-07 DIAGNOSIS — E7800 Pure hypercholesterolemia, unspecified: Secondary | ICD-10-CM

## 2020-04-07 LAB — CYTOLOGY - PAP
Comment: NEGATIVE
Diagnosis: NEGATIVE
High risk HPV: NEGATIVE

## 2020-04-07 NOTE — Telephone Encounter (Signed)
-----   Message from [name], [place] sent at 04/07/2020 10:14 AM EDT ----- Blood count is normal. Kidney and liver function are normal. Sodium, potassium and calcium are normal. A1c is slightly improved at 5.9, had been 6.0. Cholesterol has increased slightly compared to last year. Could benefit from a cholesterol lowering medication if agreeable. Thyroid is normal. Vit D is borderline low. Would benefit from OTC Vit D supplement of 1000-2000 IU daily.

## 2020-04-07 NOTE — Telephone Encounter (Signed)
Pt advised.  She agreed to start a cholesterol medication.  Please send to [facility].   Thanks,   -Vernona Rieger

## 2020-04-08 MED ORDER — ATORVASTATIN CALCIUM 10 MG PO TABS: 10.0000 mg | ORAL_TABLET | Freq: Every day | ORAL | 3 refills | Status: DC

## 2020-04-08 NOTE — Addendum Note (Signed)
Addended by: [name] on: 04/08/2020 08:14 AM   Modules accepted: Orders

## 2020-04-08 NOTE — Telephone Encounter (Signed)
Sent in Atorvastatin 10mg 

## 2020-04-08 NOTE — Telephone Encounter (Signed)
Patient was advised of labs results in another encounter

## 2020-04-14 ENCOUNTER — Other Ambulatory Visit: Payer: Self-pay

## 2020-04-14 ENCOUNTER — Emergency Department: Payer: Federal, State, Local not specified - PPO

## 2020-04-14 ENCOUNTER — Emergency Department
Admission: EM | Admit: 2020-04-14 | Discharge: 2020-04-14 | Disposition: A | Discharge status: Home / Self Care | Payer: Federal, State, Local not specified - PPO | Attending: Emergency Medicine | Admitting: Emergency Medicine

## 2020-04-14 ENCOUNTER — Ambulatory Visit: Payer: Self-pay

## 2020-04-14 DIAGNOSIS — R0602 Shortness of breath: Secondary | ICD-10-CM | POA: Diagnosis not present

## 2020-04-14 DIAGNOSIS — J9801 Acute bronchospasm: Secondary | ICD-10-CM

## 2020-04-14 DIAGNOSIS — E039 Hypothyroidism, unspecified: Secondary | ICD-10-CM | POA: Diagnosis not present

## 2020-04-14 DIAGNOSIS — Z79899 Other long term (current) drug therapy: Secondary | ICD-10-CM | POA: Insufficient documentation

## 2020-04-14 DIAGNOSIS — I10 Essential (primary) hypertension: Secondary | ICD-10-CM | POA: Diagnosis not present

## 2020-04-14 DIAGNOSIS — Z87891 Personal history of nicotine dependence: Secondary | ICD-10-CM | POA: Insufficient documentation

## 2020-04-14 LAB — BASIC METABOLIC PANEL
Anion gap: 8 (ref 5–15)
BUN: 19 mg/dL (ref 6–20)
CO2: 25 mmol/L (ref 22–32)
Calcium: 8.8 mg/dL — ABNORMAL LOW (ref 8.9–10.3)
Chloride: 105 mmol/L (ref 98–111)
Creatinine, Ser: 0.72 mg/dL (ref 0.44–1.00)
GFR calc Af Amer: >60 mL/min (ref >60)
GFR calc non Af Amer: >60 mL/min (ref >60)
Glucose, Bld: 142 mg/dL — ABNORMAL HIGH (ref 70–99)
Potassium: 3.8 mmol/L (ref 3.5–5.1)
Sodium: 138 mmol/L (ref 135–145)

## 2020-04-14 LAB — CBC
HCT: 41.1 % (ref 36.0–46.0)
Hemoglobin: 13.7 g/dL (ref 12.0–15.0)
MCH: 28.9 pg (ref 26.0–34.0)
MCHC: 33.3 g/dL (ref 30.0–36.0)
MCV: 86.7 fL (ref 80.0–100.0)
Platelets: 255 10*3/uL (ref 150–400)
RBC: 4.74 MIL/uL (ref 3.87–5.11)
RDW: 12.5 % (ref 11.5–15.5)
WBC: 6.7 10*3/uL (ref 4.0–10.5)
nRBC: 0 % (ref 0.0–0.2)

## 2020-04-14 LAB — TROPONIN I (HIGH SENSITIVITY): Troponin I (High Sensitivity): 3 ng/L (ref <18)

## 2020-04-14 MED ORDER — IPRATROPIUM-ALBUTEROL 0.5-2.5 (3) MG/3ML IN SOLN: 3.0000 mL | Freq: Once | RESPIRATORY_TRACT | Status: AC

## 2020-04-14 MED ORDER — IPRATROPIUM-ALBUTEROL 0.5-2.5 (3) MG/3ML IN SOLN
3.0000 mL | Freq: Once | RESPIRATORY_TRACT | Status: AC
  Administered 2020-04-14: 3 mL via RESPIRATORY_TRACT

## 2020-04-14 MED ORDER — PREDNISONE 50 MG PO TABS: 50.0000 mg | ORAL_TABLET | Freq: Every day | ORAL | 0 refills | Status: DC

## 2020-04-14 MED ORDER — IPRATROPIUM-ALBUTEROL 0.5-2.5 (3) MG/3ML IN SOLN
RESPIRATORY_TRACT | Status: AC
  Administered 2020-04-14: 3 mL via RESPIRATORY_TRACT
  Filled 2020-04-14: qty 6

## 2020-04-14 MED ORDER — SODIUM CHLORIDE 0.9% FLUSH: 3.0000 mL | Freq: Once | INTRAVENOUS | Status: DC

## 2020-04-14 MED ORDER — IOHEXOL 350 MG/ML SOLN
75.0000 mL | Freq: Once | INTRAVENOUS | Status: AC | PRN
  Administered 2020-04-14: 17:00:00 75 mL via INTRAVENOUS
  Filled 2020-04-14: qty 75

## 2020-04-14 NOTE — Telephone Encounter (Signed)
Patient called stating that she has achy legs and feels breathless, " like I can't get a good breath in". She states that she is tired. She has some sinus issues. She states that she just does not feel well.  She also states that on Saturday she had Chest Pain that caused her left arm to ache that lasted 10-15 minutes.  She has not had  Chest Pain again. He BP is 168/76 with HR 84. She states HR feels normal no fluttering or unusual beating. Patient states that she has just started a cholesterol medication.  She has family HX of heart Dx. She has completed her COVID-19 vaccines. Per protocol patient will go to ER for evaluation of her symptoms.  Care advice read to patient.  She verbalized understanding.  Reason for Disposition . [1] Chest pain lasts > 5 minutes AND [2] occurred in past 3 days (72 hours)  Answer Assessment - Initial Assessment Questions 1. RESPIRATORY STATUS: "Describe your breathing?" (e.g., wheezing, shortness of breath, unable to speak, severe coughing)      Not catching deep breath today 2. ONSET: "When did this breathing problem begin?"      yesterday 3. PATTERN "Does the difficult breathing come and go, or has it been constant since it started?"      Its there 4. SEVERITY: "How bad is your breathing?" (e.g., mild, moderate, severe)    - MILD: No SOB at rest, mild SOB with walking, speaks normally in sentences, can lay down, no retractions, pulse < 100.    - MODERATE: SOB at rest, SOB with minimal exertion and prefers to sit, cannot lie down flat, speaks in phrases, mild retractions, audible wheezing, pulse 100-120.    - SEVERE: Very SOB at rest, speaks in single words, struggling to breathe, sitting hunched forward, retractions, pulse > 120     moderate 5. RECURRENT SYMPTOM: "Have you had difficulty breathing before?" If so, ask: "When was the last time?" and "What happened that time?"     Yes for a while 6. CARDIAC HISTORY: "Do you have any history of heart disease?" (e.g.,  heart attack, angina, bypass surgery, angioplasty)     no 7. LUNG HISTORY: "Do you have any history of lung disease?"  (e.g., pulmonary embolus, asthma, emphysema)     no 8. CAUSE: "What do you think is causing the breathing problem?"    Unsure but cholesterol medication may be the cause 9. OTHER SYMPTOMS: "Do you have any other symptoms? (e.g., dizziness, runny nose, cough, chest pain, fever)    Allergies drainage down back of throat real tired, chest pressure left arm pain a few days ago 10. PREGNANCY: "Is there any chance you are pregnant?" "When was your last menstrual period?"       N/A 11. TRAVEL: "Have you traveled out of the country in the last month?" (e.g., travel history, exposures)      no  Answer Assessment - Initial Assessment Questions 1. LOCATION: "Where does it hurt?"       Center and down left arm 2. RADIATION: "Does the pain go anywhere else?" (e.g., into neck, jaw, arms, back)     Arm ached 3. ONSET: "When did the chest pain begin?" (Minutes, hours or days)      A few days ago only once on saturday 4. PATTERN "Does the pain come and go, or has it been constant since it started?"  "Does it get worse with exertion?"      Only one time 5.  DURATION: "How long does it last" (e.g., seconds, minutes, hours)     10-15 minutes 6. SEVERITY: "How bad is the pain?"  (e.g., Scale 1-10; mild, moderate, or severe)    - MILD (1-3): doesn't interfere with normal activities     - MODERATE (4-7): interferes with normal activities or awakens from sleep    - SEVERE (8-10): excruciating pain, unable to do any normal activities       4-5 7. CARDIAC RISK FACTORS: "Do you have any history of heart problems or risk factors for heart disease?" (e.g., angina, prior heart attack; diabetes, high blood pressure, high cholesterol, smoker, or strong family history of heart disease)     Cholesterol medication, blood pressure medication 8. PULMONARY RISK FACTORS: "Do you have any history of lung  disease?"  (e.g., blood clots in lung, asthma, emphysema, birth control pills)     SOB 9. CAUSE: "What do you think is causing the chest pain?"     unsure 10. OTHER SYMPTOMS: "Do you have any other symptoms?" (e.g., dizziness, nausea, vomiting, sweating, fever, difficulty breathing, cough)      Sweats but is normal 11. PREGNANCY: "Is there any chance you are pregnant?" "When was your last menstrual period?"     N/A  Protocols used: CHEST PAIN-A-AH, BREATHING DIFFICULTY-A-AH

## 2020-04-14 NOTE — ED Provider Notes (Signed)
[facility] Emergency Department Provider Note   ____________________________________________    I have reviewed the triage vital signs and the nursing notes.   HISTORY  Chief Complaint Shortness of Breath     HPI Jane [name] is a 59 y.o. female with history as noted below who presents with complaints of chest pain and shortness of breath.  Patient reports 5 days ago she had an episode of chest pain with radiation to her left arm which lasted about 15 minutes, improved after she took a hot shower.  Since then she has felt short of breath like she has to work harder to take a deep breath.  She does not smoke.  She denies cough.  No fevers or chills.  No Covid symptoms.  Has had both Covid vaccines.  No leg swelling.  Has not take anything for this  Past Medical History:  Diagnosis Date  . Allergy   . Arthritis    Knees, feet, elbows , neck  . Asthma    mild; assoc with seasonal allergies; uses Albuterol once per year on average.  Marland Kitchen Atypical chest pain    hx   . Complication of anesthesia    nausea, last 2 surgeries nausea much less  . Fibromyalgia 12/04/2010   [facility] in [place]; Dr. Gwenevere Ghazi; followed every six months.  . Fibromyalgia   . GERD (gastroesophageal reflux disease)   . Heart murmur    Mitral valve Prolapse  . HLD (hyperlipidemia)   . Hypothyroidism post radioactive iodine   Hyperthyroidism; now secondary hypothyroidism  . MR (mitral regurgitation)    moderate; probably MVP. echo 3/10 at [facility] cardiology with normal EF, moderate MR     Patient Active Problem List   Diagnosis Date Noted  . Essential hypertension 04/05/2020  . Rectal inflammation   . Diverticulosis of large intestine without diverticulitis   . Gastric polyp   . Esophageal dysphagia   . Localized primary carpometacarpal osteoarthrosis, right 08/18/2017  . 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 Vitamin D deficiency 09/23/2016  .  Glucose intolerance (impaired glucose tolerance) 01/01/2016  . Class 1 obesity due to excess calories with serious comorbidity and body mass index (BMI) of 33.0 to 33.9 in adult 01/01/2016  . Gastroesophageal reflux disease without esophagitis 01/01/2016  . Asthma, mild intermittent 01/01/2016  . GAD (generalized anxiety disorder) 11/09/2014  . Osteoarthritis, knee 10/20/2012  . Fibromyalgia 10/20/2012  . Hypothyroidism following radioiodine therapy 10/20/2012  . Allergic rhinitis 02/24/2010  . Hyperlipidemia 12/31/2009  . MURMUR 12/31/2009    Past Surgical History:  Procedure Laterality Date  . COLONOSCOPY  12/04/2009   normal; [facility]  . COLONOSCOPY WITH PROPOFOL N/A 08/09/2018   Procedure: COLONOSCOPY WITH PROPOFOL;  Surgeon: Pasty Spillers, MD;  Location: [facility] ENDOSCOPY;  Service: Endoscopy;  Laterality: N/A;  . ESOPHAGOGASTRODUODENOSCOPY (EGD) WITH PROPOFOL N/A 08/09/2018   Procedure: ESOPHAGOGASTRODUODENOSCOPY (EGD) WITH PROPOFOL;  Surgeon: Pasty Spillers, MD;  Location: [facility] ENDOSCOPY;  Service: Endoscopy;  Laterality: N/A;  . inner thigh surgery    . JOINT REPLACEMENT    . KNEE ARTHROPLASTY Right 10/25/2016   Procedure: COMPUTER ASSISTED TOTAL KNEE ARTHROPLASTY;  Surgeon: Donato Heinz, MD;  Location: [facility] ORS;  Service: Orthopedics;  Laterality: Right;  . KNEE SURGERY  7/10  . 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 left foot surgery     for plantar fascitis  . novascue uterine ablation  2007  . REPLACEMENT  TOTAL KNEE Left     Prior to Admission medications   Medication Sig Start Date End Date Taking? Authorizing Provider  albuterol (PROAIR HFA) 108 (90 Base) MCG/ACT inhaler Inhale 2 puffs into the lungs every 6 (six) hours as needed for wheezing or shortness of breath. Patient not taking: Reported on 09/04/2019 06/26/19   Tyler Pita, MD  atorvastatin (LIPITOR) 10 MG tablet Take 1 tablet (10 mg total) by mouth daily. 04/08/20   Mar Daring, PA-C   clonazePAM (KLONOPIN) 0.5 MG tablet Take 0.5-1 tablets (0.25-0.5 mg total) by mouth 2 (two) times daily as needed for anxiety. Patient not taking: Reported on 12/08/2019 09/04/19   Mar Daring, PA-C  conjugated estrogens (PREMARIN) vaginal cream Place 1 Applicatorful vaginally daily. 04/05/20   Mar Daring, PA-C  fluticasone (FLOVENT HFA) 110 MCG/ACT inhaler Inhale 2 puffs into the lungs daily. Patient not taking: Reported on 04/05/2020 01/27/19   Mar Daring, PA-C  levothyroxine (SYNTHROID) 112 MCG tablet TAKE ONE TABLET EVERY DAY 30 MINUTES BEFORE BREAKFAST 04/05/20   Mar Daring, PA-C  losartan (COZAAR) 100 MG tablet Take 1 tablet (100 mg total) by mouth daily. 04/05/20   Mar Daring, PA-C  meloxicam (MOBIC) 15 MG tablet Take 15 mg by mouth daily.  04/21/17   [provider]  Olopatadine HCl (PATADAY) 0.2 % SOLN Apply 1 drop to eye in the morning and at bedtime. 03/11/20   Mar Daring, PA-C  omeprazole (PRILOSEC) 20 MG capsule TAKE ONE CAPSULE TWICE A DAY BEFORE MEALS 05/14/19   Fenton Malling M, PA-C  predniSONE (DELTASONE) 50 MG tablet Take 1 tablet (50 mg total) by mouth daily with breakfast. 04/14/20   Lavonia Drafts, MD  traMADol (ULTRAM) 50 MG tablet Take 50 mg by mouth every 6 (six) hours as needed. 04/01/20   [provider]  venlafaxine XR (EFFEXOR-XR) 75 MG 24 hr capsule Take 1 capsule (75 mg total) by mouth daily with breakfast. 03/08/20   Mar Daring, PA-C     Allergies Celebrex [celecoxib]  Family History  Problem Relation Age of Onset  . Stroke Father 108       CVA  . COPD Father   . Hyperlipidemia Mother   . Heart disease Mother 36       CABG in 63s; AMI age 3  . Hypertension Mother   . Hypertension Sister   . Breast cancer Paternal Aunt 62    Social History Social History   Tobacco Use  . Smoking status: Former Smoker    Packs/day: 2.00    Years: 15.00    Pack years: 30.00    Types: Cigarettes     Quit date: 10/12/1994    Years since quitting: 25.5  . Smokeless tobacco: Never Used  . Tobacco comment: smoked 2ppd for 15 years; quit in 1995   Substance Use Topics  . Alcohol use: Yes    Alcohol/week: 0.0 standard drinks    Comment: social, rare  . Drug use: No    Review of Systems  Constitutional: No fever/chills Eyes: No visual changes.  ENT: No sore throat. Cardiovascular: Denies chest pain. Respiratory: Denies shortness of breath. Gastrointestinal: No abdominal pain.  No nausea, no vomiting.   Genitourinary: Negative for dysuria. Musculoskeletal: Negative for back pain. Skin: Negative for rash. Neurological: Negative for headaches or weakness   ____________________________________________   PHYSICAL EXAM:  VITAL SIGNS: ED Triage Vitals  Enc Vitals Group     BP 04/14/20 1423 Marland Kitchen)  160/59     Pulse Rate 04/14/20 1423 93     Resp 04/14/20 1423 18     Temp 04/14/20 1426 98 F (36.7 C)     Temp Source 04/14/20 1423 Oral     SpO2 04/14/20 1423 97 %     Weight 04/14/20 1423 97.5 kg (215 lb)     Height 04/14/20 1423 1.702 m (5\' 7" )     Head Circumference --      Peak Flow --      Pain Score 04/14/20 1423 0     Pain Loc --      Pain Edu? --      Excl. in GC? --     Constitutional: Alert and oriented.   Nose: No congestion/rhinnorhea. Mouth/Throat: Mucous membranes are moist.    Cardiovascular: Normal rate, regular rhythm. Grossly normal heart sounds.  Good peripheral circulation. Respiratory: Normal respiratory effort.  No retractions. Lungs CTAB. Gastrointestinal: Soft and nontender. No distention.  No CVA tenderness.  Musculoskeletal: No lower extremity tenderness nor edema.  Warm and well perfused Neurologic:  Normal speech and language. No gross focal neurologic deficits are appreciated.  Skin:  Skin is warm, dry and intact. No rash noted. Psychiatric: Mood and affect are normal. Speech and behavior are  normal.  ____________________________________________   LABS (all labs ordered are listed, but only abnormal results are displayed)  Labs Reviewed  BASIC METABOLIC PANEL - Abnormal; Notable for the following components:      Result Value   Glucose, Bld 142 (*)    Calcium 8.8 (*)    All other components within normal limits  CBC  TROPONIN I (HIGH SENSITIVITY)   ____________________________________________  EKG  ____________________________________________  RADIOLOGY  Chest x-ray reviewed by me, no pneumonia ____________________________________________   PROCEDURES  Procedure(s) performed: No  Procedures   Critical Care performed: No ____________________________________________   INITIAL IMPRESSION / ASSESSMENT AND PLAN / ED COURSE  Pertinent labs & imaging results that were available during my care of the patient were reviewed by me and considered in my medical decision making (see chart for details).  Patient presents with episode of chest discomfort which was brief over the weekend, she is more concerned with shortness of breath which seems to be constant at this time.  Her vital signs are overall reassuring, mildly hypertensive.  Possible scattered wheezes on exam.  Differential includes bronchospasm, pneumonia, PE, edema.  Will treat with duo nebs and obtain CT imaging to rule out PE or pneumonia  Patient with improvement after DuoNeb's.  CT angiography negative for PE nor pneumonia  Appropriate for discharge at this time, will place on 5-day prednisone burst, if continued symptoms close outpatient follow-up.  Return for any worsening symptoms.    ____________________________________________   FINAL CLINICAL IMPRESSION(S) / ED DIAGNOSES  Final diagnoses:  Shortness of breath  Bronchospasm        Note:  This document was prepared using Dragon voice recognition software and may include unintentional dictation errors.   06/14/20, MD 04/14/20  1726

## 2020-04-14 NOTE — ED Triage Notes (Signed)
Pt c/o having SOB for the past 2 days, states she had an episode of chest pain radiating into her left arm Saturday lasting about , denies any chest pain today,

## 2020-04-20 ENCOUNTER — Ambulatory Visit: Payer: Self-pay

## 2020-04-20 NOTE — Telephone Encounter (Addendum)
Pt. Reports she was in ED 04/14/20 with shortness of breath and chest pain. States "they didn't find anything wrong with my heart or lungs. Put me on Prednisone and I felt better, but when I stopped it my shortness of breath came back." Worse after she eats. Worse when she is outside. States she thinks she could have GI issues casing this. Pt. Requesting a late in the day appointment due to work. No availability with her PCP.Instructed to go back to ED if symptoms worsen. Please advise pt. Who could see her later in the day as possible. Contact number [phone]. Reason for Disposition . [1] MODERATE longstanding difficulty breathing (e.g., speaks in phrases, SOB even at rest, pulse 100-120) AND [2] SAME as normal  Answer Assessment - Initial Assessment Questions 1. RESPIRATORY STATUS: "Describe your breathing?" (e.g., wheezing, shortness of breath, unable to speak, severe coughing)      Shortness of breath 2. ONSET: "When did this breathing problem begin?"      Started 1 week ago 3. PATTERN "Does the difficult breathing come and go, or has it been constant since it started?"      Comes and goes 4. SEVERITY: "How bad is your breathing?" (e.g., mild, moderate, severe)    - MILD: No SOB at rest, mild SOB with walking, speaks normally in sentences, can lay down, no retractions, pulse < 100.    - MODERATE: SOB at rest, SOB with minimal exertion and prefers to sit, cannot lie down flat, speaks in phrases, mild retractions, audible wheezing, pulse 100-120.    - SEVERE: Very SOB at rest, speaks in single words, struggling to breathe, sitting hunched forward, retractions, pulse > 120      Mild 5. RECURRENT SYMPTOM: "Have you had difficulty breathing before?" If so, ask: "When was the last time?" and "What happened that time?"      Yes 6. CARDIAC HISTORY: "Do you have any history of heart disease?" (e.g., heart attack, angina, bypass surgery, angioplasty)      No 7. LUNG HISTORY: "Do you have any history  of lung disease?"  (e.g., pulmonary embolus, asthma, emphysema)     No 8. CAUSE: "What do you think is causing the breathing problem?"      Unsure 9. OTHER SYMPTOMS: "Do you have any other symptoms? (e.g., dizziness, runny nose, cough, chest pain, fever)     Shortness of breath at rest and exertion 10. PREGNANCY: "Is there any chance you are pregnant?" "When was your last menstrual period?"       No 11. TRAVEL: "Have you traveled out of the country in the last month?" (e.g., travel history, exposures)       No  Protocols used: BREATHING DIFFICULTY-A-AH

## 2020-04-21 NOTE — Telephone Encounter (Signed)
Called patient and she states that someone from the office has already contact her and schedule appointment with Antony Contras on Monday,04/26/2020. She also stated that its a breathing problem and she does not think that she is having a heart attack. FYI

## 2020-04-21 NOTE — Telephone Encounter (Signed)
Urgent care or ER

## 2020-04-26 ENCOUNTER — Ambulatory Visit (INDEPENDENT_AMBULATORY_CARE_PROVIDER_SITE_OTHER): Payer: Federal, State, Local not specified - PPO | Admitting: Physician Assistant

## 2020-04-26 ENCOUNTER — Encounter: Payer: Self-pay | Admitting: Physician Assistant

## 2020-04-26 ENCOUNTER — Other Ambulatory Visit: Payer: Self-pay

## 2020-04-26 VITALS — BP 126/76 | HR 83 | Temp 97.0°F | Resp 16 | Wt 217.6 lb

## 2020-04-26 DIAGNOSIS — F411 Generalized anxiety disorder: Secondary | ICD-10-CM

## 2020-04-26 DIAGNOSIS — M797 Fibromyalgia: Secondary | ICD-10-CM

## 2020-04-26 DIAGNOSIS — J189 Pneumonia, unspecified organism: Secondary | ICD-10-CM | POA: Diagnosis not present

## 2020-04-26 DIAGNOSIS — J9801 Acute bronchospasm: Secondary | ICD-10-CM

## 2020-04-26 MED ORDER — PREDNISONE 20 MG PO TABS: 20.0000 mg | ORAL_TABLET | Freq: Every day | ORAL | 0 refills | Status: DC

## 2020-04-26 MED ORDER — AZITHROMYCIN 250 MG PO TABS: ORAL_TABLET | ORAL | 0 refills | Status: DC

## 2020-04-26 MED ORDER — VENLAFAXINE HCL ER 150 MG PO CP24: 150.0000 mg | ORAL_CAPSULE | Freq: Every day | ORAL | 1 refills | Status: DC

## 2020-04-26 NOTE — Progress Notes (Signed)
Established patient visit   Patient: Jane [name]   DOB: [birth year]   59 y.o. Female  MRN: [id number] Visit Date: 04/26/2020  Today's healthcare provider: [name], PA-C   Chief Complaint  Patient presents with  . Follow-up    ER   Subjective    HPI Follow up ER visit  Patient was seen in ER at [facility] on 04/14/2020 She was treated SOB and Bronchospasm  Treatment for this included duo nebs and CT-angiography negative for PE nor pneumonia and 5 day prednisone.  She reports excellent compliance with treatment. Reports that she did well with the prednisone but the SOB is back. She reports this condition is Improved. Reports that is off and on with sob. She feels this is more with what she is eating or maybe acid reflux or hernia. She ate watermelon on last Tuesday and she had episode of chest tightness and reports that this happens with juice that she starts to feel her chest getting tighter.  ------------------------------------------------------------------------------ Reports that she stopped the Atorvastatin 2 weeks ago because she was having muscle ache. Reports that she she still having the muscle aches.    Patient Active Problem List   Diagnosis Date Noted  . Essential hypertension 04/05/2020  . Rectal inflammation   . Diverticulosis of large intestine without diverticulitis   . Gastric polyp   . Esophageal dysphagia   . Localized primary carpometacarpal osteoarthrosis, right 08/18/2017  . 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 Vitamin D deficiency 09/23/2016  . Glucose intolerance (impaired glucose tolerance) 01/01/2016  . Class 1 obesity due to excess calories with serious comorbidity and body mass index (BMI) of 33.0 to 33.9 in adult 01/01/2016  . Gastroesophageal reflux disease without esophagitis 01/01/2016  . Asthma, mild intermittent 01/01/2016  . GAD (generalized anxiety disorder) 11/09/2014  . Osteoarthritis, knee 10/20/2012  .  Fibromyalgia 10/20/2012  . Hypothyroidism following radioiodine therapy 10/20/2012  . Allergic rhinitis 02/24/2010  . Hyperlipidemia 12/31/2009  . MURMUR 12/31/2009   Past Medical History:  Diagnosis Date  . Allergy   . Arthritis    Knees, feet, elbows , neck  . Asthma    mild; assoc with seasonal allergies; uses Albuterol once per year on average.  Marland Kitchen Atypical chest pain    hx   . Complication of anesthesia    nausea, last 2 surgeries nausea much less  . Fibromyalgia 12/04/2010   [facility] in [place]; Dr. Gwenevere Ghazi; followed every six months.  . Fibromyalgia   . GERD (gastroesophageal reflux disease)   . Heart murmur    Mitral valve Prolapse  . HLD (hyperlipidemia)   . Hypothyroidism post radioactive iodine   Hyperthyroidism; now secondary hypothyroidism  . MR (mitral regurgitation)    moderate; probably MVP. echo 3/10 at [facility] cardiology with normal EF, moderate MR        Medications: Outpatient Medications Prior to Visit  Medication Sig  . clonazePAM (KLONOPIN) 0.5 MG tablet Take 0.5-1 tablets (0.25-0.5 mg total) by mouth 2 (two) times daily as needed for anxiety.  . conjugated estrogens (PREMARIN) vaginal cream Place 1 Applicatorful vaginally daily.  Marland Kitchen levothyroxine (SYNTHROID) 112 MCG tablet TAKE ONE TABLET EVERY DAY 30 MINUTES BEFORE BREAKFAST  . losartan (COZAAR) 100 MG tablet Take 1 tablet (100 mg total) by mouth daily.  . meloxicam (MOBIC) 15 MG tablet Take 15 mg by mouth daily.   Marland Kitchen omeprazole (PRILOSEC) 20 MG capsule TAKE ONE CAPSULE TWICE A DAY  BEFORE MEALS  . traMADol (ULTRAM) 50 MG tablet Take 50 mg by mouth every 6 (six) hours as needed.  . [DISCONTINUED] atorvastatin (LIPITOR) 10 MG tablet Take 1 tablet (10 mg total) by mouth daily.  . [DISCONTINUED] venlafaxine XR (EFFEXOR-XR) 75 MG 24 hr capsule Take 1 capsule (75 mg total) by mouth daily with breakfast.  . [DISCONTINUED] albuterol (PROAIR HFA) 108 (90 Base) MCG/ACT inhaler Inhale 2 puffs into  the lungs every 6 (six) hours as needed for wheezing or shortness of breath. (Patient not taking: Reported on 09/04/2019)  . [DISCONTINUED] fluticasone (FLOVENT HFA) 110 MCG/ACT inhaler Inhale 2 puffs into the lungs daily. (Patient not taking: Reported on 04/05/2020)  . [DISCONTINUED] Olopatadine HCl (PATADAY) 0.2 % SOLN Apply 1 drop to eye in the morning and at bedtime.  . [DISCONTINUED] predniSONE (DELTASONE) 50 MG tablet Take 1 tablet (50 mg total) by mouth daily with breakfast.   No facility-administered medications prior to visit.    Review of Systems  Constitutional: Positive for fatigue. Negative for fever.  Respiratory: Positive for chest tightness and shortness of breath. Negative for wheezing.   Cardiovascular: Negative for chest pain, palpitations and leg swelling.  Gastrointestinal: Negative.   Neurological: Negative for dizziness and headaches.    Last CBC Lab Results  Component Value Date   WBC 6.7 04/14/2020   HGB 13.7 04/14/2020   HCT 41.1 04/14/2020   MCV 86.7 04/14/2020   MCH 28.9 04/14/2020   RDW 12.5 04/14/2020   PLT 255 04/14/2020   Last metabolic panel Lab Results  Component Value Date   GLUCOSE 142 (H) 04/14/2020   NA 138 04/14/2020   K 3.8 04/14/2020   CL 105 04/14/2020   CO2 25 04/14/2020   BUN 19 04/14/2020   CREATININE 0.72 04/14/2020   GFRNONAA >60 04/14/2020   GFRAA >60 04/14/2020   CALCIUM 8.8 (L) 04/14/2020   PROT 6.5 04/05/2020   ALBUMIN 4.3 04/05/2020   LABGLOB 2.2 04/05/2020   AGRATIO 2.0 04/05/2020   BILITOT <0.2 04/05/2020   ALKPHOS 96 04/05/2020   AST 20 04/05/2020   ALT 22 04/05/2020   ANIONGAP 8 04/14/2020   Last lipids Lab Results  Component Value Date   CHOL 204 (H) 04/05/2020   HDL 36 (L) 04/05/2020   LDLCALC 134 (H) 04/05/2020   TRIG 186 (H) 04/05/2020   CHOLHDL 5.7 (H) 04/05/2020   Last hemoglobin A1c Lab Results  Component Value Date   HGBA1C 5.9 (H) 04/05/2020   Last thyroid functions Lab Results  Component  Value Date   TSH 1.780 04/05/2020   Last vitamin D Lab Results  Component Value Date   VD25OH 28.0 (L) 04/05/2020   Last vitamin B12 and Folate Lab Results  Component Value Date   VITAMINB12 562 05/03/2017      Objective    BP 126/76 (BP Location: Left Arm, Patient Position: Sitting, Cuff Size: Large)   Pulse 83   Temp (!) 97 F (36.1 C) (Temporal)   Resp 16   Wt 217 lb 9.6 oz (98.7 kg)   BMI 34.08 kg/m  BP Readings from Last 3 Encounters:  04/26/20 126/76  04/14/20 (!) 164/57  04/05/20 (!) 148/83   Wt Readings from Last 3 Encounters:  04/26/20 217 lb 9.6 oz (98.7 kg)  04/14/20 215 lb (97.5 kg)  04/05/20 215 lb (97.5 kg)      Physical Exam Vitals reviewed.  Constitutional:      General: She is not in acute distress.  Appearance: Normal appearance. She is well-developed. She is not ill-appearing or diaphoretic.  Neck:     Thyroid: No thyromegaly.     Vascular: No JVD.     Trachea: No tracheal deviation.  Cardiovascular:     Rate and Rhythm: Normal rate and regular rhythm.     Pulses: Normal pulses.     Heart sounds: Normal heart sounds. No murmur. No friction rub. No gallop.   Pulmonary:     Effort: Accessory muscle usage present. No respiratory distress.     Breath sounds: Examination of the left-upper field reveals decreased breath sounds. Decreased breath sounds present. No wheezing, rhonchi or rales.  Chest:     Chest wall: No tenderness.  Musculoskeletal:     Cervical back: Normal range of motion and neck supple.  Lymphadenopathy:     Cervical: No cervical adenopathy.  Neurological:     Mental Status: She is alert.      No results found for any visits on 04/26/20.  Assessment & Plan     1. Bronchospasm Had improved but returned as soon as she completed the prednisone. Has not progressed to where it was when she went to ER, but is still noticing having to use other muscles to help breathe. Has been aggravated by foods and liquids. CT and CXR  were normal in ER. Suspect possible infectious cause due to continued symptoms associated with fatigue. Will treat as atypical pneumonia with zpak and prednisone as below.  - predniSONE (DELTASONE) 20 MG tablet; Take 1 tablet (20 mg total) by mouth daily with breakfast.  Dispense: 5 tablet; Refill: 0  2. Walking pneumonia See above medical treatment plan. - predniSONE (DELTASONE) 20 MG tablet; Take 1 tablet (20 mg total) by mouth daily with breakfast.  Dispense: 5 tablet; Refill: 0 - azithromycin (ZITHROMAX) 250 MG tablet; Take 2 tablets PO on day one, and one tablet PO daily thereafter until completed.  Dispense: 6 tablet; Refill: 0  3. GAD (generalized anxiety disorder) Increase venlafaxine to 150mg  as below due to increased fibromyalgia flare currently. Will f/u via mychart in 4-6 weeks to let me know if tolerating the increased venlafaxine and also hopefully improvement in the above symptoms with above treatment.  - venlafaxine XR (EFFEXOR-XR) 150 MG 24 hr capsule; Take 1 capsule (150 mg total) by mouth daily with breakfast.  Dispense: 90 capsule; Refill: 1  4. Fibromyalgia See above medical treatment plan. - venlafaxine XR (EFFEXOR-XR) 150 MG 24 hr capsule; Take 1 capsule (150 mg total) by mouth daily with breakfast.  Dispense: 90 capsule; Refill: 1   No follow-ups on file.      Reynolds Bowl, PA-C, have reviewed all documentation for this visit. The documentation on 04/26/20 for the exam, diagnosis, procedures, and orders are all accurate and complete.   Rubye Beach  [facility] [phone] (phone) [phone] (fax)  [place]

## 2020-04-30 ENCOUNTER — Telehealth: Payer: Self-pay

## 2020-04-30 DIAGNOSIS — R0602 Shortness of breath: Secondary | ICD-10-CM

## 2020-04-30 DIAGNOSIS — M25562 Pain in left knee: Secondary | ICD-10-CM | POA: Diagnosis not present

## 2020-04-30 DIAGNOSIS — Z96652 Presence of left artificial knee joint: Secondary | ICD-10-CM | POA: Diagnosis not present

## 2020-04-30 NOTE — Telephone Encounter (Signed)
Ordered. She can come Tuesday or sometime next week to have drawn.

## 2020-04-30 NOTE — Telephone Encounter (Signed)
Copied from CRM [id number]. Topic: General - Other >> Apr 30, 2020  4:54 PM Maebelle Munroe wrote: Reason for CRM: Patient calling to discuss getting an Alpha-1 test for recent breathing issues

## 2020-05-04 NOTE — Telephone Encounter (Signed)
Patient advised.

## 2020-05-05 DIAGNOSIS — R0602 Shortness of breath: Secondary | ICD-10-CM | POA: Diagnosis not present

## 2020-05-06 ENCOUNTER — Telehealth: Payer: Self-pay

## 2020-05-06 LAB — ALPHA-1-ANTITRYPSIN: A-1 Antitrypsin: 113 mg/dL (ref 101–187)

## 2020-05-06 NOTE — Telephone Encounter (Signed)
-----   Message from [name], PA-C sent at 05/06/2020 10:53 AM EDT ----- Apha 1 antitrypsin level is normal

## 2020-05-06 NOTE — Telephone Encounter (Signed)
LMTCB-If patient calls back OK for [facility] to give results.

## 2020-05-06 NOTE — Telephone Encounter (Signed)
Result note read to pt, verbalizes understanding. 

## 2020-05-13 ENCOUNTER — Ambulatory Visit: Payer: Self-pay | Admitting: *Deleted

## 2020-05-13 NOTE — Telephone Encounter (Signed)
Pt called with complaints of nose bleeds on  05/10/20 and 05/12/20; they both lasted about 15 min each; she states that she bleed at least 1/2 cup, and she saw clots; recommendations made per nurse triage protocol; decision tree completed; pt offered and accepted appt with Joycelyn Man, [facility], 05/14/20 at 1640; she verbalized understanding; will route to office for notification. Reason for Disposition . [1] Large amount of blood has been lost (e.g., 1 cup or 240 ml) AND [2] bleeding now controlled (stopped)  Answer Assessment - Initial Assessment Questions 1. AMOUNT OF BLEEDING: "How bad is the bleeding?" "How much blood was lost?" "Has the bleeding stopped?"   - MILD: needed a couple tissues   - MODERATE: needed many tissues   - SEVERE: large blood clots, soaked many tissues, lasted more than 30 minutes      moderate 2. ONSET: "When did the nosebleed start?"   05/09/20 3. FREQUENCY: "How many nosebleeds have you had in the last 24 hours?"      1 4. RECURRENT SYMPTOMS: "Have there been other recent nosebleeds?" If so, ask: "How long did it take you to stop the bleeding?" "What worked best?"      When she was a little girl 5. CAUSE: "What do you think caused this nosebleed?"     Not sure 6. LOCAL FACTORS: "Do you have any cold symptoms?", "Have you been rubbing or picking at your nose?"     Dry nasal passages; used saline nasal spray with moisturizer 7. SYSTEMIC FACTORS: "Do you have high blood pressure or any bleeding problems?"     Yes htn 8. BLOOD THINNERS: "Do you take any blood thinners?" (e.g., coumadin, heparin, aspirin, Plavix)     no 9. OTHER SYMPTOMS: "Do you have any other symptoms?" (e.g., lightheadedness)     no 10. PREGNANCY: "Is there any chance you are pregnant?" "When was your last menstrual period?"       No menopause  Protocols used: NOSEBLEED-A-AH

## 2020-05-13 NOTE — Progress Notes (Signed)
Established patient visit   Patient: Jane [name]   DOB: Jun 18, 1961   59 y.o. Female  MRN: [id number] Visit Date: 05/14/2020  Today's healthcare provider: [name], PA-C   Chief Complaint  Patient presents with  . Epistaxis   Subjective    Epistaxis  The bleeding has been from the right nare. This is a new problem. The current episode started in the past 7 days. The problem occurs every several days. The problem has been unchanged. The bleeding is associated with nothing. She has tried nothing for the symptoms. Her past medical history is significant for allergies.     Patient Active Problem List   Diagnosis Date Noted  . Essential hypertension 04/05/2020  . Rectal inflammation   . Diverticulosis of large intestine without diverticulitis   . Gastric polyp   . Esophageal dysphagia   . Localized primary carpometacarpal osteoarthrosis, right 08/18/2017  . 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 Vitamin D deficiency 09/23/2016  . Glucose intolerance (impaired glucose tolerance) 01/01/2016  . Class 1 obesity due to excess calories with serious comorbidity and body mass index (BMI) of 33.0 to 33.9 in adult 01/01/2016  . Gastroesophageal reflux disease without esophagitis 01/01/2016  . Asthma, mild intermittent 01/01/2016  . GAD (generalized anxiety disorder) 11/09/2014  . Osteoarthritis, knee 10/20/2012  . Fibromyalgia 10/20/2012  . Hypothyroidism following radioiodine therapy 10/20/2012  . Allergic rhinitis 02/24/2010  . Hyperlipidemia 12/31/2009  . MURMUR 12/31/2009   Social History   Tobacco Use  . Smoking status: Former Smoker    Packs/day: 2.00    Years: 15.00    Pack years: 30.00    Types: Cigarettes    Quit date: 10/12/1994    Years since quitting: 25.6  . Smokeless tobacco: Never Used  . Tobacco comment: smoked 2ppd for 15 years; quit in 1995   Vaping Use  . Vaping Use: Never used  Substance Use Topics  . Alcohol use: Yes     Alcohol/week: 0.0 standard drinks    Comment: social, rare  . Drug use: No       Medications: Outpatient Medications Prior to Visit  Medication Sig  . clonazePAM (KLONOPIN) 0.5 MG tablet Take 0.5-1 tablets (0.25-0.5 mg total) by mouth 2 (two) times daily as needed for anxiety.  . conjugated estrogens (PREMARIN) vaginal cream Place 1 Applicatorful vaginally daily.  Marland Kitchen levothyroxine (SYNTHROID) 112 MCG tablet TAKE ONE TABLET EVERY DAY 30 MINUTES BEFORE BREAKFAST  . losartan (COZAAR) 100 MG tablet Take 1 tablet (100 mg total) by mouth daily.  . meloxicam (MOBIC) 15 MG tablet Take 15 mg by mouth daily.   Marland Kitchen omeprazole (PRILOSEC) 20 MG capsule TAKE ONE CAPSULE TWICE A DAY BEFORE MEALS  . traMADol (ULTRAM) 50 MG tablet Take 50 mg by mouth every 6 (six) hours as needed.  . venlafaxine XR (EFFEXOR-XR) 150 MG 24 hr capsule Take 1 capsule (150 mg total) by mouth daily with breakfast.  . [DISCONTINUED] traMADol (ULTRAM) 50 MG tablet Take 50 mg by mouth every 6 (six) hours as needed.  . [DISCONTINUED] azithromycin (ZITHROMAX) 250 MG tablet Take 2 tablets PO on day one, and one tablet PO daily thereafter until completed.  . [DISCONTINUED] predniSONE (DELTASONE) 20 MG tablet Take 1 tablet (20 mg total) by mouth daily with breakfast.   No facility-administered medications prior to visit.    Review of Systems  Constitutional: Negative for chills and fever.  HENT: Positive for nosebleeds.   Respiratory: Negative  for cough and wheezing.   Cardiovascular: Negative for chest pain and palpitations.  Neurological: Negative for headaches.    Last CBC Lab Results  Component Value Date   WBC 6.7 04/14/2020   HGB 13.7 04/14/2020   HCT 41.1 04/14/2020   MCV 86.7 04/14/2020   MCH 28.9 04/14/2020   RDW 12.5 04/14/2020   PLT 255 04/14/2020      Objective    BP 119/71 (BP Location: Left Arm, Patient Position: Sitting, Cuff Size: Large)   Pulse 76   Temp (!) 96.8 F (36 C) (Temporal)   Resp 16   Ht  5\' 7"  (1.702 m)   Wt 214 lb 12.8 oz (97.4 kg)   BMI 33.64 kg/m  BP Readings from Last 3 Encounters:  05/14/20 119/71  04/26/20 126/76  04/14/20 (!) 164/57   Wt Readings from Last 3 Encounters:  05/14/20 214 lb 12.8 oz (97.4 kg)  04/26/20 217 lb 9.6 oz (98.7 kg)  04/14/20 215 lb (97.5 kg)      Physical Exam Vitals reviewed.  Constitutional:      General: She is not in acute distress.    Appearance: Normal appearance. She is well-developed. She is obese. She is not ill-appearing or diaphoretic.  HENT:     Head: Normocephalic and atraumatic.     Right Ear: Hearing, tympanic membrane, ear canal and external ear normal.     Left Ear: Hearing, tympanic membrane, ear canal and external ear normal.     Nose: Nasal tenderness present. No nasal deformity, signs of injury or congestion.     Right Nostril: Epistaxis (resolved now) present. No foreign body, septal hematoma or occlusion.     Left Nostril: No epistaxis.     Right Turbinates: Not enlarged, swollen or pale.     Right Sinus: No maxillary sinus tenderness or frontal sinus tenderness.     Comments: Nasal wound noted over the kiesselbach plexus in the right anterior nare    Mouth/Throat:     Pharynx: Uvula midline. No oropharyngeal exudate.  Eyes:     General: No scleral icterus.       Right eye: No discharge.        Left eye: No discharge.     Conjunctiva/sclera: Conjunctivae normal.     Pupils: Pupils are equal, round, and reactive to light.  Neck:     Thyroid: No thyromegaly.     Trachea: No tracheal deviation.  Cardiovascular:     Rate and Rhythm: Normal rate and regular rhythm.     Heart sounds: Normal heart sounds. No murmur heard.  No friction rub. No gallop.   Pulmonary:     Effort: Pulmonary effort is normal. No respiratory distress.     Breath sounds: Normal breath sounds. No stridor. No wheezing or rales.  Musculoskeletal:     Cervical back: Normal range of motion and neck supple.  Lymphadenopathy:      Cervical: No cervical adenopathy.  Skin:    General: Skin is warm and dry.  Neurological:     Mental Status: She is alert.      No results found for any visits on 05/14/20.  Assessment & Plan     1. SOB (shortness of breath) Having continued SOB and cough. Has been undergoing testing and imaging without source. Possibly from environmental allergies. Will try Montelukast as below. Call if not improving.  - montelukast (SINGULAIR) 10 MG tablet; Take 1 tablet (10 mg total) by mouth at bedtime.  Dispense: 30 tablet;  Refill: 3  2. Epistaxis Will use bactroban for the nasal wound in the right nare. May use Afrin nasal spray if nose bleeds continue. If she does have another nose bleed she is to call the office and we will refer to ENT.  - mupirocin ointment (BACTROBAN) 2 %; Place 1 application into the nose 2 (two) times daily.  Dispense: 22 g; Refill: 0  No follow-ups on file.      Reynolds Bowl, PA-C, have reviewed all documentation for this visit. The documentation on 05/18/20 for the exam, diagnosis, procedures, and orders are all accurate and complete.   Rubye Beach  [facility] [phone] (phone) [phone] (fax)  [place]

## 2020-05-14 ENCOUNTER — Other Ambulatory Visit: Payer: Self-pay

## 2020-05-14 ENCOUNTER — Ambulatory Visit: Payer: Federal, State, Local not specified - PPO | Admitting: Physician Assistant

## 2020-05-14 ENCOUNTER — Encounter: Payer: Self-pay | Admitting: Physician Assistant

## 2020-05-14 VITALS — BP 119/71 | HR 76 | Temp 96.8°F | Resp 16 | Ht 67.0 in | Wt 214.8 lb

## 2020-05-14 DIAGNOSIS — R0602 Shortness of breath: Secondary | ICD-10-CM

## 2020-05-14 DIAGNOSIS — R040 Epistaxis: Secondary | ICD-10-CM | POA: Diagnosis not present

## 2020-05-14 MED ORDER — MONTELUKAST SODIUM 10 MG PO TABS: 10.0000 mg | ORAL_TABLET | Freq: Every day | ORAL | 3 refills | Status: DC

## 2020-05-14 MED ORDER — MUPIROCIN 2 % EX OINT: 1.0000 "application " | TOPICAL_OINTMENT | Freq: Two times a day (BID) | CUTANEOUS | 0 refills | Status: DC

## 2020-05-14 NOTE — Patient Instructions (Addendum)
Afrin for nose bleed  Nosebleed, Adult A nosebleed is when blood comes out of the nose. Nosebleeds are common. Usually, they are not a sign of a serious condition. Nosebleeds can happen if a small blood vessel in your nose starts to bleed or if the lining of your nose (mucous membrane) cracks. They are commonly caused by:  Allergies.  Colds.  Picking your nose.  Blowing your nose too hard.  An injury from sticking an object into your nose or getting hit in the nose.  Dry or cold air. Less common causes of nosebleeds include:  Toxic fumes.  Something abnormal in the nose or in the air-filled spaces in the bones of the face (sinuses).  Growths in the nose, such as polyps.  Medicines or conditions that cause blood to clot slowly.  Certain illnesses or procedures that irritate or dry out the nasal passages. Follow these instructions at home: When you have a nosebleed:   Sit down and tilt your head slightly forward.  Use a clean towel or tissue to pinch your nostrils under the bony part of your nose. After 10 minutes, let go of your nose and see if bleeding starts again. Do not release pressure before that time. If there is still bleeding, repeat the pinching and holding for 10 minutes until the bleeding stops.  Do not place tissues or gauze in the nose to stop bleeding.  Avoid lying down and avoid tilting your head backward. That may make blood collect in the throat and cause gagging or coughing.  Use a nasal spray decongestant to help with a nosebleed as told by your health care provider.  Do not use petroleum jelly or mineral oil in your nose. It can drip into your lungs. After a nosebleed:  Avoid blowing your nose or sniffing for a number of hours.  Avoid straining, lifting, or bending at the waist for several days. You may resume other normal activities as you are able.  Use saline spray or a humidifier as told by your health care provider.  Aspirinand blood  thinners make bleeding more likely. If you are prescribed these medicines and you suffer from nosebleeds: ? Ask your health care provider if you should stop taking the medicines or if you should adjust the dose. ? Do not stop taking medicines that your health care provider has recommended unless told by your health care provider.  If your nosebleed was caused by dry mucous membranes, use over-the-counter saline nasal spray or gel. This will keep the mucous membranes moist and allow them to heal. If you must use a lubricant: ? Choose one that is water-soluble. ? Use only as much as you need and use it only as often as needed. ? Do not lie down until several hours after you use it. Contact a health care provider if:  You have a fever.  You get nosebleeds often or more often than usual.  You bruise very easily.  You have a nosebleed from having something stuck in your nose.  You have bleeding in your mouth.  You vomit or cough up brown material.  You have a nosebleed after you start a new medicine. Get help right away if:  You have a nosebleed after a fall or a head injury.  Your nosebleed does not go away after 20 minutes.  You feel dizzy or weak.  You have unusual bleeding from other parts of your body.  You have unusual bruising on other parts of your body.  You become sweaty.  You vomit blood. This information is not intended to replace advice given to you by your health care provider. Make sure you discuss any questions you have with your health care provider. Document Revised: 02/19/2018 Document Reviewed: 06/06/2016 Elsevier Patient Education  [place].

## 2020-05-18 ENCOUNTER — Encounter: Payer: Self-pay | Admitting: Physician Assistant

## 2020-06-02 ENCOUNTER — Telehealth: Payer: Self-pay

## 2020-06-02 DIAGNOSIS — R040 Epistaxis: Secondary | ICD-10-CM

## 2020-06-02 NOTE — Telephone Encounter (Signed)
Copied from CRM [id number]. Topic: Referral - Request for Referral >> Jun 02, 2020  8:41 AM Crist Infante wrote: Has patient seen PCP for this complaint? yes Pt states jenny told her if she had another nose bleed to call and she would refer her to ENT.  Pt did have another one on the way to work today, and requesting urgent referral to ENT be placed. (pt states nose bleed has stopped, she is OK) >> Jun 02, 2020  8:44 AM Crist Infante wrote: Pt states wherever Boneta Lucks wants to refer her to is ok.  She can go today if they can work her in.

## 2020-06-02 NOTE — Telephone Encounter (Signed)
Requesting to be contacted on house phone  Best contact: [phone]

## 2020-06-02 NOTE — Telephone Encounter (Signed)
Referral placed.

## 2020-06-02 NOTE — Telephone Encounter (Signed)
Best contact: [phone]

## 2020-06-04 DIAGNOSIS — J310 Chronic rhinitis: Secondary | ICD-10-CM | POA: Diagnosis not present

## 2020-06-04 DIAGNOSIS — R040 Epistaxis: Secondary | ICD-10-CM | POA: Diagnosis not present

## 2020-06-14 NOTE — Progress Notes (Signed)
Established patient visit   Patient: Jane [name]   DOB: 07-07-1961   59 y.o. Female  MRN: [id number] Visit Date: 06/15/2020  Today's healthcare provider: Dortha Kern, PA   Chief Complaint  Patient presents with   Rash   Subjective    Rash This is a new problem. The current episode started in the past 7 days. The problem is unchanged. The affected locations include the neck (right side). The rash is characterized by pain, burning and redness. It is unknown if there was an exposure to a precipitant. Associated symptoms include a sore throat. Pertinent negatives include no congestion, cough, diarrhea, eye pain, facial edema, fatigue, fever, joint pain, rhinorrhea, shortness of breath or vomiting. Past treatments include antibiotic cream and anti-itch cream. The treatment provided no relief.    Past Medical History:  Diagnosis Date   Allergy    Arthritis    Knees, feet, elbows , neck   Asthma    mild; assoc with seasonal allergies; uses Albuterol once per year on average.   Atypical chest pain    hx    Complication of anesthesia    nausea, last 2 surgeries nausea much less   Fibromyalgia 12/04/2010   [facility] in [place]; Dr. Gwenevere Ghazi; followed every six months.   Fibromyalgia    GERD (gastroesophageal reflux disease)    Heart murmur    Mitral valve Prolapse   HLD (hyperlipidemia)    Hypothyroidism post radioactive iodine   Hyperthyroidism; now secondary hypothyroidism   MR (mitral regurgitation)    moderate; probably MVP. echo 3/10 at [facility] cardiology with normal EF, moderate MR    Past Surgical History:  Procedure Laterality Date   COLONOSCOPY  12/04/2009   normal; [facility]   COLONOSCOPY WITH PROPOFOL N/A 08/09/2018   Procedure: COLONOSCOPY WITH PROPOFOL;  Surgeon: Pasty Spillers, MD;  Location: [facility] END[facility];  Service: Endoscopy;  Laterality: N/A;   ESOPHAGOGASTRODUODENOSCOPY (EGD) WITH PROPOFOL N/A 08/09/2018   Procedure:  ESOPHAGOGASTRODUODENOSCOPY (EGD) WITH PROPOFOL;  Surgeon: Pasty Spillers, MD;  Location: [facility] ENDOSCOPY;  Service: Endoscopy;  Laterality: N/A;   inner thigh surgery     JOINT REPLACEMENT     KNEE ARTHROPLASTY Right 10/25/2016   Procedure: COMPUTER ASSISTED TOTAL KNEE ARTHROPLASTY;  Surgeon: Donato Heinz, MD;  Location: [facility] ORS;  Service: Orthopedics;  Laterality: Right;   KNEE SURGERY  7/10   LAPAROSCOPY  1984   for endomeriosis   left foot surgery     for plantar fascitis   novascue uterine ablation  2007   REPLACEMENT TOTAL KNEE Left    Social History   Tobacco Use   Smoking status: Former Smoker    Packs/day: 2.00    Years: 15.00    Pack years: 30.00    Types: Cigarettes    Quit date: 10/12/1994    Years since quitting: 25.6   Smokeless tobacco: Never Used   Tobacco comment: smoked 2ppd for 15 years; quit in 1995   Vaping Use   Vaping Use: Never used  Substance Use Topics   Alcohol use: Yes    Alcohol/week: 0.0 standard drinks    Comment: social, rare   Drug use: No   Family Status  Relation Name Status   Father  Deceased at age 46       pneumonia; CVA, COPD   Mother  Deceased at age 36       CABG, MI (first at 5), HTN   Sister  Alive  x2. HTN, smokers, overweight.    Sister  Alive   Pat Aunt  Alive   Allergies  Allergen Reactions   Celebrex [Celecoxib] Swelling and Shortness Of Breath    Swelling. Developed Facial swelling    Atorvastatin Other (See Comments)    myalgia       Medications: Outpatient Medications Prior to Visit  Medication Sig   levothyroxine (SYNTHROID) 112 MCG tablet TAKE ONE TABLET EVERY DAY 30 MINUTES BEFORE BREAKFAST   losartan (COZAAR) 100 MG tablet Take 1 tablet (100 mg total) by mouth daily.   meloxicam (MOBIC) 15 MG tablet Take 15 mg by mouth daily.    omeprazole (PRILOSEC) 20 MG capsule TAKE ONE CAPSULE TWICE A DAY BEFORE MEALS   venlafaxine XR (EFFEXOR-XR) 150 MG 24 hr capsule Take 1 capsule (150 mg total) by  mouth daily with breakfast.   clonazePAM (KLONOPIN) 0.5 MG tablet Take 0.5-1 tablets (0.25-0.5 mg total) by mouth 2 (two) times daily as needed for anxiety. (Patient not taking: Reported on 06/15/2020)   conjugated estrogens (PREMARIN) vaginal cream Place 1 Applicatorful vaginally daily. (Patient not taking: Reported on 06/15/2020)   montelukast (SINGULAIR) 10 MG tablet Take 1 tablet (10 mg total) by mouth at bedtime. (Patient not taking: Reported on 06/15/2020)   mupirocin ointment (BACTROBAN) 2 % Place 1 application into the nose 2 (two) times daily. (Patient not taking: Reported on 06/15/2020)   traMADol (ULTRAM) 50 MG tablet Take 50 mg by mouth every 6 (six) hours as needed. (Patient not taking: Reported on 06/15/2020)   No facility-administered medications prior to visit.    Review of Systems  Constitutional: Negative for fatigue and fever.  HENT: Positive for sore throat. Negative for congestion and rhinorrhea.   Eyes: Negative for pain.  Respiratory: Negative for cough and shortness of breath.   Gastrointestinal: Negative for diarrhea and vomiting.  Musculoskeletal: Negative for joint pain.  Skin: Positive for rash.     Objective    BP 134/67 (BP Location: Right Arm, Patient Position: Sitting, Cuff Size: Large)   Pulse 74   Temp (!) 95.9 F (35.5 C) (Temporal)   Ht 5\' 7"  (1.702 m)   Wt 217 lb 12.8 oz (98.8 kg)   BMI 34.11 kg/m   Physical Exam Constitutional:      General: She is not in acute distress.    Appearance: She is well-developed.  HENT:     Head: Normocephalic and atraumatic.     Right Ear: Hearing normal.     Left Ear: Hearing normal.     Nose: Nose normal.  Eyes:     General: Lids are normal. No scleral icterus.       Right eye: No discharge.        Left eye: No discharge.     Conjunctiva/sclera: Conjunctivae normal.  Pulmonary:     Effort: Pulmonary effort is normal. No respiratory distress.  Musculoskeletal:        General: Normal range of motion.   Skin:    Findings: Rash present. No lesion.     Comments: Red painful rash with clusters of scabbed/blisters on the right side of neck and shoulder.  Neurological:     Mental Status: She is alert and oriented to person, place, and time.  Psychiatric:        Speech: Speech normal.        Behavior: Behavior normal.        Thought Content: Thought content normal.      No results  found for any visits on 06/15/20.  Assessment & Plan     1. Herpes zoster lesion Classic lesions of herpes zoster on the right side of neck and shoulder. Lesions are beginning to scab. Will treat with Famvir 500 mg TID for a week and recheck as needed. May use Tramadol she has at home for pain and/or Lidocaine Gel or Spray.    No follow-ups on file.      Haywood Pao, PA, have reviewed all documentation for this visit. The documentation on 06/15/20 for the exam, diagnosis, procedures, and orders are all accurate and complete.    Dortha Kern, PA  [facility] [phone] (phone) [phone] (fax)  [facility]

## 2020-06-15 ENCOUNTER — Ambulatory Visit: Payer: Federal, State, Local not specified - PPO | Admitting: Family Medicine

## 2020-06-15 ENCOUNTER — Encounter: Payer: Self-pay | Admitting: Family Medicine

## 2020-06-15 ENCOUNTER — Other Ambulatory Visit: Payer: Self-pay

## 2020-06-15 VITALS — BP 134/67 | HR 74 | Temp 95.9°F | Ht 67.0 in | Wt 217.8 lb

## 2020-06-15 DIAGNOSIS — B028 Zoster with other complications: Secondary | ICD-10-CM

## 2020-06-15 MED ORDER — FAMCICLOVIR 500 MG PO TABS: 500.0000 mg | ORAL_TABLET | Freq: Three times a day (TID) | ORAL | 0 refills | Status: DC

## 2020-06-15 NOTE — Patient Instructions (Signed)
Shingles  Shingles, which is also known as herpes zoster, is an infection that causes a painful skin rash and fluid-filled blisters. It is caused by a virus. Shingles only develops in people who:  Have had chickenpox.  Have been given a medicine to protect against chickenpox (have been vaccinated). Shingles is rare in this group. What are the causes? Shingles is caused by varicella-zoster virus (VZV). This is the same virus that causes chickenpox. After a person is exposed to VZV, the virus stays in the body in an inactive (dormant) state. Shingles develops if the virus is reactivated. This can happen many years after the first (initial) exposure to VZV. It is not known what causes this virus to be reactivated. What increases the risk? People who have had chickenpox or received the chickenpox vaccine are at risk for shingles. Shingles infection is more common in people who:  Are older than age 60.  Have a weakened disease-fighting system (immune system), such as people with: ? HIV. ? AIDS. ? Cancer.  Are taking medicines that weaken the immune system, such as transplant medicines.  Are experiencing a lot of stress. What are the signs or symptoms? Early symptoms of this condition include itching, tingling, and pain in an area on your skin. Pain may be described as burning, stabbing, or throbbing. A few days or weeks after early symptoms start, a painful red rash appears. The rash is usually on one side of the body and has a band-like or belt-like pattern. The rash eventually turns into fluid-filled blisters that break open, change into scabs, and dry up in about 2-3 weeks. At any time during the infection, you may also develop:  A fever.  Chills.  A headache.  An upset stomach. How is this diagnosed? This condition is diagnosed with a skin exam. Skin or fluid samples may be taken from the blisters before a diagnosis is made. These samples are examined under a microscope or sent to  a lab for testing. How is this treated? The rash may last for several weeks. There is not a specific cure for this condition. Your health care provider will probably prescribe medicines to help you manage pain, recover more quickly, and avoid long-term problems. Medicines may include:  Antiviral drugs.  Anti-inflammatory drugs.  Pain medicines.  Anti-itching medicines (antihistamines). If the area involved is on your face, you may be referred to a specialist, such as an eye doctor (ophthalmologist) or an ear, nose, and throat (ENT) doctor (otolaryngologist) to help you avoid eye problems, chronic pain, or disability. Follow these instructions at home: Medicines  Take over-the-counter and prescription medicines only as told by your health care provider.  Apply an anti-itch cream or numbing cream to the affected area as told by your health care provider. Relieving itching and discomfort   Apply cold, wet cloths (cold compresses) to the area of the rash or blisters as told by your health care provider.  Cool baths can be soothing. Try adding baking soda or dry oatmeal to the water to reduce itching. Do not bathe in hot water. Blister and rash care  Keep your rash covered with a loose bandage (dressing). Wear loose-fitting clothing to help ease the pain of material rubbing against the rash.  Keep your rash and blisters clean by washing the area with mild soap and cool water as told by your health care provider.  Check your rash every day for signs of infection. Check for: ? More redness, swelling, or pain. ? Fluid   or blood. ? Warmth. ? Pus or a bad smell.  Do not scratch your rash or pick at your blisters. To help avoid scratching: ? Keep your fingernails clean and cut short. ? Wear gloves or mittens while you sleep, if scratching is a problem. General instructions  Rest as told by your health care provider.  Keep all follow-up visits as told by your health care provider. This  is important.  Wash your hands often with soap and water. If soap and water are not available, use hand sanitizer. Doing this lowers your chance of getting a bacterial skin infection.  Before your blisters change into scabs, your shingles infection can cause chickenpox in people who have never had it or have never been vaccinated against it. To prevent this from happening, avoid contact with other people, especially: ? Babies. ? Pregnant women. ? Children who have eczema. ? Elderly people who have transplants. ? People who have chronic illnesses, such as cancer or AIDS. Contact a health care provider if:  Your pain is not relieved with prescribed medicines.  Your pain does not get better after the rash heals.  You have signs of infection in the rash area, such as: ? More redness, swelling, or pain around the rash. ? Fluid or blood coming from the rash. ? The rash area feeling warm to the touch. ? Pus or a bad smell coming from the rash. Get help right away if:  The rash is on your face or nose.  You have facial pain, pain around your eye area, or loss of feeling on one side of your face.  You have difficulty seeing.  You have ear pain or have ringing in your ear.  You have a loss of taste.  Your condition gets worse. Summary  Shingles, which is also known as herpes zoster, is an infection that causes a painful skin rash and fluid-filled blisters.  This condition is diagnosed with a skin exam. Skin or fluid samples may be taken from the blisters and examined before the diagnosis is made.  Keep your rash covered with a loose bandage (dressing). Wear loose-fitting clothing to help ease the pain of material rubbing against the rash.  Before your blisters change into scabs, your shingles infection can cause chickenpox in people who have never had it or have never been vaccinated against it. This information is not intended to replace advice given to you by your health care  provider. Make sure you discuss any questions you have with your health care provider. Document Revised: 03/14/2019 Document Reviewed: 07/25/2017 Elsevier Patient Education  2020 Elsevier Inc.  

## 2020-06-17 DIAGNOSIS — R040 Epistaxis: Secondary | ICD-10-CM | POA: Diagnosis not present

## 2020-06-17 DIAGNOSIS — J342 Deviated nasal septum: Secondary | ICD-10-CM | POA: Diagnosis not present

## 2020-06-17 DIAGNOSIS — J3489 Other specified disorders of nose and nasal sinuses: Secondary | ICD-10-CM | POA: Diagnosis not present

## 2020-06-18 ENCOUNTER — Telehealth: Payer: Self-pay

## 2020-06-18 NOTE — Telephone Encounter (Signed)
Pt advised as directed below.   Thanks,   -Jessamine Barcia  

## 2020-06-18 NOTE — Telephone Encounter (Signed)
She can use tylenol and ibuprofen alternating between the two. If she is doing that and it isnt helping we can send in gabapentin.

## 2020-06-18 NOTE — Telephone Encounter (Signed)
Copied from CRM [id number]. Topic: General - Other >> Jun 18, 2020  9:26 AM Jaquita Rector A wrote: Reason for CRM: Patient called to say that she was dxed with Shingles and now she is having the pain associated with it asking Joycelyn Man or an available physician what she can take to combat the pain. Please call Ph# [phone]

## 2020-06-21 ENCOUNTER — Telehealth: Payer: Self-pay

## 2020-06-21 NOTE — Telephone Encounter (Signed)
Copied from CRM [id number]. Topic: General - Other >> Jun 21, 2020  1:39 PM Dalphine Handing A wrote: Patient was advised to callback if she needed a work note for employer. Patient needs work note to excuse her from work on this past Friday and Saturday and to also document that heat is causing her to itch and patient may experieince this for a limited amount of time. Patient would like this documentation faxed to [facility] at [phone] >> Jun 21, 2020  1:44 PM Dalphine Handing A wrote: Routed to wrong place. Needs to go to [facility]

## 2020-06-21 NOTE — Telephone Encounter (Signed)
Letter available in mychart.

## 2020-06-22 NOTE — Telephone Encounter (Signed)
LM to let her know the letter is available in [place]. If patient calls ok for PEC/Admin to let patient know.

## 2020-07-16 ENCOUNTER — Other Ambulatory Visit: Payer: Self-pay | Admitting: Physician Assistant

## 2020-07-16 DIAGNOSIS — F411 Generalized anxiety disorder: Secondary | ICD-10-CM

## 2020-07-16 DIAGNOSIS — M797 Fibromyalgia: Secondary | ICD-10-CM

## 2020-07-27 DIAGNOSIS — M7541 Impingement syndrome of right shoulder: Secondary | ICD-10-CM | POA: Diagnosis not present

## 2020-07-27 DIAGNOSIS — M47812 Spondylosis without myelopathy or radiculopathy, cervical region: Secondary | ICD-10-CM | POA: Diagnosis not present

## 2020-07-27 DIAGNOSIS — M25511 Pain in right shoulder: Secondary | ICD-10-CM | POA: Diagnosis not present

## 2020-08-06 ENCOUNTER — Ambulatory Visit: Payer: Federal, State, Local not specified - PPO | Admitting: Physician Assistant

## 2020-08-06 ENCOUNTER — Encounter: Payer: Self-pay | Admitting: Physician Assistant

## 2020-08-06 ENCOUNTER — Other Ambulatory Visit: Payer: Self-pay

## 2020-08-06 VITALS — BP 131/82 | HR 76 | Ht 67.0 in | Wt 214.0 lb

## 2020-08-06 DIAGNOSIS — M546 Pain in thoracic spine: Secondary | ICD-10-CM

## 2020-08-06 MED ORDER — VALACYCLOVIR HCL 1 G PO TABS: 1000.0000 mg | ORAL_TABLET | Freq: Three times a day (TID) | ORAL | 0 refills | Status: AC

## 2020-08-06 NOTE — Patient Instructions (Signed)
Shingles  Shingles is an infection. It gives you a painful skin rash and blisters that have fluid in them. Shingles is caused by the same germ (virus) that causes chickenpox. Shingles only happens in people who:  Have had chickenpox.  Have been given a shot of medicine (vaccine) to protect against chickenpox. Shingles is rare in this group. The first symptoms of shingles may be itching, tingling, or pain in an area on your skin. A rash will show on your skin a few days or weeks later. The rash is likely to be on one side of your body. The rash usually has a shape like a belt or a band. Over time, the rash turns into fluid-filled blisters. The blisters will break open, change into scabs, and dry up. Medicines may:  Help with pain and itching.  Help you get better sooner.  Help to prevent long-term problems. Follow these instructions at home: Medicines  Take over-the-counter and prescription medicines only as told by your doctor.  Put on an anti-itch cream or numbing cream where you have a rash, blisters, or scabs. Do this as told by your doctor. Helping with itching and discomfort   Put cold, wet cloths (cold compresses) on the area of the rash or blisters as told by your doctor.  Cool baths can help you feel better. Try adding baking soda or dry oatmeal to the water to lessen itching. Do not bathe in hot water. Blister and rash care  Keep your rash covered with a loose bandage (dressing).  Wear loose clothing that does not rub on your rash.  Keep your rash and blisters clean. To do this, wash the area with mild soap and cool water as told by your doctor.  Check your rash every day for signs of infection. Check for: ? More redness, swelling, or pain. ? Fluid or blood. ? Warmth. ? Pus or a bad smell.  Do not scratch your rash. Do not pick at your blisters. To help you to not scratch: ? Keep your fingernails clean and cut short. ? Wear gloves or mittens when you sleep, if  scratching is a problem. General instructions  Rest as told by your doctor.  Keep all follow-up visits as told by your doctor. This is important.  Wash your hands often with soap and water. If soap and water are not available, use hand sanitizer. Doing this lowers your chance of getting a skin infection caused by germs (bacteria).  Your infection can cause chickenpox in people who have never had chickenpox or never got a shot of chickenpox vaccine. If you have blisters that did not change into scabs yet, try not to touch other people or be around other people, especially: ? Babies. ? Pregnant women. ? Children who have areas of red, itchy, or rough skin (eczema). ? Very old people who have transplants. ? People who have a long-term (chronic) sickness, like cancer or AIDS. Contact a doctor if:  Your pain does not get better with medicine.  Your pain does not get better after the rash heals.  You have any signs of infection in the rash area. These signs include: ? More redness, swelling, or pain around the rash. ? Fluid or blood coming from the rash. ? The rash area feeling warm to the touch. ? Pus or a bad smell coming from the rash. Get help right away if:  The rash is on your face or nose.  You have pain in your face or pain by   your eye.  You lose feeling on one side of your face.  You have trouble seeing.  You have ear pain, or you have ringing in your ear.  You have a loss of taste.  Your condition gets worse. Summary  Shingles gives you a painful skin rash and blisters that have fluid in them.  Shingles is an infection. It is caused by the same germ (virus) that causes chickenpox.  Keep your rash covered with a loose bandage (dressing). Wear loose clothing that does not rub on your rash.  If you have blisters that did not change into scabs yet, try not to touch other people or be around people. This information is not intended to replace advice given to you by  your health care provider. Make sure you discuss any questions you have with your health care provider. Document Revised: 03/14/2019 Document Reviewed: 07/25/2017 Elsevier Patient Education  2020 Elsevier Inc.  

## 2020-08-06 NOTE — Progress Notes (Signed)
Established patient visit   Patient: Jane [name]   DOB: Aug 13, 1961   59 y.o. Female  MRN: [id number] Visit Date: 08/06/2020  Today's healthcare provider: Trey Sailors, PA-C   Chief Complaint  Patient presents with  . skin irritation    Subjective    HPI  Patient presents today with possible shingles. She was diagnosed with Shingles on 06/15/20. She reports that she has a burning sensation in her upper left back for about 2 days. She doesn't see a rash, but feels that it could start later. She does have a gallbladder and she doesn't have a history of gallstones. This pain is not related to eating and she has no nausea or vomiting. She denies chest pain, SOB, chills.     Medications: Outpatient Medications Prior to Visit  Medication Sig  . levothyroxine (SYNTHROID) 112 MCG tablet TAKE ONE TABLET EVERY DAY 30 MINUTES BEFORE BREAKFAST  . losartan (COZAAR) 100 MG tablet Take 1 tablet (100 mg total) by mouth daily.  . meloxicam (MOBIC) 15 MG tablet Take 15 mg by mouth daily.   . clonazePAM (KLONOPIN) 0.5 MG tablet Take 0.5-1 tablets (0.25-0.5 mg total) by mouth 2 (two) times daily as needed for anxiety. (Patient not taking: Reported on 06/15/2020)  . conjugated estrogens (PREMARIN) vaginal cream Place 1 Applicatorful vaginally daily. (Patient not taking: Reported on 06/15/2020)  . montelukast (SINGULAIR) 10 MG tablet Take 1 tablet (10 mg total) by mouth at bedtime. (Patient not taking: Reported on 06/15/2020)  . mupirocin ointment (BACTROBAN) 2 % Place 1 application into the nose 2 (two) times daily. (Patient not taking: Reported on 06/15/2020)  . omeprazole (PRILOSEC) 20 MG capsule TAKE ONE CAPSULE TWICE A DAY BEFORE MEALS  . traMADol (ULTRAM) 50 MG tablet Take 50 mg by mouth every 6 (six) hours as needed. (Patient not taking: Reported on 06/15/2020)  . venlafaxine XR (EFFEXOR-XR) 150 MG 24 hr capsule Take 1 capsule (150 mg total) by mouth daily with breakfast.  . [DISCONTINUED]  famciclovir (FAMVIR) 500 MG tablet Take 1 tablet (500 mg total) by mouth 3 (three) times daily. (Patient not taking: Reported on 08/06/2020)   No facility-administered medications prior to visit.    Review of Systems  Constitutional: Negative.   Cardiovascular: Negative.   Skin: Negative for color change, pallor, rash and wound.  Neurological: Negative for numbness.  Hematological: Does not bruise/bleed easily.      Objective    BP 131/82   Pulse 76   Ht 5\' 7"  (1.702 m)   Wt 214 lb (97.1 kg)   BMI 33.52 kg/m    Physical Exam Constitutional:      Appearance: Normal appearance.  Cardiovascular:     Rate and Rhythm: Normal rate and regular rhythm.     Heart sounds: Normal heart sounds.  Pulmonary:     Effort: Pulmonary effort is normal.     Breath sounds: Normal breath sounds.  Abdominal:     General: Bowel sounds are normal.     Palpations: Abdomen is soft.     Tenderness: There is no abdominal tenderness. There is no guarding or rebound.     Hernia: No hernia is present.  Skin:    General: Skin is warm and dry.  Neurological:     Mental Status: She is alert and oriented to person, place, and time. Mental status is at baseline.  Psychiatric:        Mood and Affect: Mood normal.  Behavior: Behavior normal.       No results found for any visits on 08/06/20.  Assessment & Plan    1. Acute right-sided thoracic back pain  Will treat empirically for shingles. If pain persists, will need to investigate other causes including cholelithiasis, MSK, pulm.   - valACYclovir (VALTREX) 1000 MG tablet; Take 1 tablet (1,000 mg total) by mouth 3 (three) times daily for 7 days.  Dispense: 21 tablet; Refill: 0    Return if symptoms worsen or fail to improve.      ITrey Sailors, PA-C, have reviewed all documentation for this visit. The documentation on 08/06/20 for the exam, diagnosis, procedures, and orders are all accurate and complete.  The entirety of the  information documented in the History of Present Illness, Review of Systems and Physical Exam were personally obtained by me. Portions of this information were initially documented by Anson Oregon, CMA and reviewed by me for thoroughness and accuracy.     Maryella Shivers  [facility] [phone] (phone) [phone] (fax)  [facility]

## 2020-08-10 DIAGNOSIS — M542 Cervicalgia: Secondary | ICD-10-CM | POA: Diagnosis not present

## 2020-08-10 DIAGNOSIS — M5030 Other cervical disc degeneration, unspecified cervical region: Secondary | ICD-10-CM | POA: Diagnosis not present

## 2020-08-18 ENCOUNTER — Other Ambulatory Visit: Payer: Self-pay | Admitting: Physician Assistant

## 2020-08-18 DIAGNOSIS — M722 Plantar fascial fibromatosis: Secondary | ICD-10-CM | POA: Diagnosis not present

## 2020-08-18 DIAGNOSIS — M47812 Spondylosis without myelopathy or radiculopathy, cervical region: Secondary | ICD-10-CM | POA: Diagnosis not present

## 2020-08-18 DIAGNOSIS — R0602 Shortness of breath: Secondary | ICD-10-CM

## 2020-08-23 DIAGNOSIS — M47812 Spondylosis without myelopathy or radiculopathy, cervical region: Secondary | ICD-10-CM | POA: Diagnosis not present

## 2020-08-26 DIAGNOSIS — M47812 Spondylosis without myelopathy or radiculopathy, cervical region: Secondary | ICD-10-CM | POA: Diagnosis not present

## 2020-08-27 DIAGNOSIS — L300 Nummular dermatitis: Secondary | ICD-10-CM | POA: Diagnosis not present

## 2020-08-27 DIAGNOSIS — L218 Other seborrheic dermatitis: Secondary | ICD-10-CM | POA: Diagnosis not present

## 2020-08-31 DIAGNOSIS — M47812 Spondylosis without myelopathy or radiculopathy, cervical region: Secondary | ICD-10-CM | POA: Diagnosis not present

## 2020-09-02 DIAGNOSIS — M47812 Spondylosis without myelopathy or radiculopathy, cervical region: Secondary | ICD-10-CM | POA: Diagnosis not present

## 2020-09-07 DIAGNOSIS — M47812 Spondylosis without myelopathy or radiculopathy, cervical region: Secondary | ICD-10-CM | POA: Diagnosis not present

## 2020-09-09 DIAGNOSIS — M47812 Spondylosis without myelopathy or radiculopathy, cervical region: Secondary | ICD-10-CM | POA: Diagnosis not present

## 2020-09-13 ENCOUNTER — Encounter: Payer: Self-pay | Admitting: Physician Assistant

## 2020-09-13 ENCOUNTER — Other Ambulatory Visit: Payer: Self-pay

## 2020-09-13 ENCOUNTER — Ambulatory Visit: Payer: Federal, State, Local not specified - PPO | Admitting: Physician Assistant

## 2020-09-13 VITALS — BP 147/87 | HR 78 | Temp 98.4°F | Resp 16 | Wt 218.2 lb

## 2020-09-13 DIAGNOSIS — M5432 Sciatica, left side: Secondary | ICD-10-CM

## 2020-09-13 DIAGNOSIS — M7918 Myalgia, other site: Secondary | ICD-10-CM | POA: Diagnosis not present

## 2020-09-13 MED ORDER — KETOROLAC TROMETHAMINE 60 MG/2ML IM SOLN
60.0000 mg | Freq: Once | INTRAMUSCULAR | Status: AC
  Administered 2020-09-13: 60 mg via INTRAMUSCULAR

## 2020-09-13 MED ORDER — PREDNISONE 10 MG (21) PO TBPK: ORAL_TABLET | ORAL | 0 refills | Status: DC

## 2020-09-13 NOTE — Progress Notes (Signed)
Established patient visit   Patient: Jane [name]   DOB: [birth year]   59 y.o. Female  MRN: [id number] Visit Date: 09/13/2020  Today's healthcare provider: [name], PA-C   Chief Complaint  Patient presents with   Pain   Subjective    HPI  Pain right side of back and buttocks pains shooting down legs, now left also. Reports that this has been going on for a weeks now. Reports that when she seats on the toilet and moves a little to the side she gets that shooting pain. Reports that she gets this shooting/stabbing pain from the mid back that radiates around to the flanks. She has taken Tramadol and Voltaren Gel.  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 10/25/2016   Vitamin D deficiency 09/23/2016   Glucose intolerance (impaired glucose tolerance) 01/01/2016   Class 1 obesity due to excess calories with serious comorbidity and body mass index (BMI) of 33.0 to 33.9 in adult 01/01/2016   Gastroesophageal reflux disease without esophagitis 01/01/2016   Asthma, mild intermittent 01/01/2016   GAD (generalized anxiety disorder) 11/09/2014   Osteoarthritis, knee 10/20/2012   Fibromyalgia 10/20/2012   Hypothyroidism following radioiodine therapy 10/20/2012   Allergic rhinitis 02/24/2010   Hyperlipidemia 12/31/2009   MURMUR 12/31/2009   Past Medical History:  Diagnosis Date   Allergy    Arthritis    Knees, feet, elbows , neck   Asthma    mild; assoc with seasonal allergies; uses Albuterol once per year on average.   Atypical chest pain    hx    Complication of anesthesia    nausea, last 2 surgeries nausea much less   Fibromyalgia 12/04/2010   [facility]  in [place]; Dr. Gwenevere Ghazi; followed every six months.   Fibromyalgia    GERD (gastroesophageal reflux disease)    Heart murmur    Mitral valve Prolapse   HLD (hyperlipidemia)    Hypothyroidism post radioactive iodine   Hyperthyroidism; now secondary hypothyroidism   MR (mitral regurgitation)    moderate; probably MVP. echo 3/10 at [facility] cardiology with normal EF, moderate MR        Medications: Outpatient Medications Prior to Visit  Medication Sig   clonazePAM (KLONOPIN) 0.5 MG tablet Take 0.5-1 tablets (0.25-0.5 mg total) by mouth 2 (two) times daily as needed for anxiety.   conjugated estrogens (PREMARIN) vaginal cream Place 1 Applicatorful vaginally daily.   levothyroxine (SYNTHROID) 112 MCG tablet TAKE ONE TABLET EVERY DAY 30 MINUTES BEFORE BREAKFAST   losartan (COZAAR) 100 MG tablet Take 1 tablet (100 mg total) by mouth daily.   meloxicam (MOBIC) 15 MG tablet Take 15 mg by mouth daily.    montelukast (SINGULAIR) 10 MG tablet TAKE ONE TABLET (10 MG) BY MOUTH AT BEDTIME   mupirocin ointment (BACTROBAN) 2 % Place 1 application into the nose 2 (two) times daily.   omeprazole (PRILOSEC) 20 MG capsule TAKE ONE CAPSULE TWICE A DAY BEFORE MEALS   traMADol (ULTRAM) 50 MG tablet Take 50 mg by mouth every 6 (six) hours as needed.    venlafaxine XR (EFFEXOR-XR) 150 MG 24 hr capsule Take 1 capsule (150 mg total) by mouth daily with breakfast.   No facility-administered  medications prior to visit.    Review of Systems  Constitutional: Negative.   Respiratory: Negative.   Cardiovascular: Negative.   Musculoskeletal: Positive for back pain, gait problem and myalgias.    Last CBC Lab Results  Component Value Date   WBC 6.7 04/14/2020   HGB 13.7 04/14/2020   HCT 41.1 04/14/2020   MCV 86.7 04/14/2020   MCH 28.9 04/14/2020   RDW 12.5 04/14/2020   PLT 255 04/14/2020   Last metabolic panel Lab Results  Component Value Date   GLUCOSE 142 (H) 04/14/2020   NA 138  04/14/2020   K 3.8 04/14/2020   CL 105 04/14/2020   CO2 25 04/14/2020   BUN 19 04/14/2020   CREATININE 0.72 04/14/2020   GFRNONAA >60 04/14/2020   GFRAA >60 04/14/2020   CALCIUM 8.8 (L) 04/14/2020   PROT 6.5 04/05/2020   ALBUMIN 4.3 04/05/2020   LABGLOB 2.2 04/05/2020   AGRATIO 2.0 04/05/2020   BILITOT <0.2 04/05/2020   ALKPHOS 96 04/05/2020   AST 20 04/05/2020   ALT 22 04/05/2020   ANIONGAP 8 04/14/2020      Objective    BP (!) 147/87 (BP Location: Left Arm, Patient Position: Sitting, Cuff Size: Large)    Pulse 78    Temp 98.4 F (36.9 C) (Oral)    Resp 16    Wt 218 lb 3.2 oz (99 kg)    BMI 34.17 kg/m  BP Readings from Last 3 Encounters:  09/13/20 (!) 147/87  08/06/20 131/82  06/15/20 134/67   Wt Readings from Last 3 Encounters:  09/13/20 218 lb 3.2 oz (99 kg)  08/06/20 214 lb (97.1 kg)  06/15/20 217 lb 12.8 oz (98.8 kg)      Physical Exam Vitals reviewed.  Constitutional:      General: She is not in acute distress.    Appearance: Normal appearance. She is well-developed. She is not diaphoretic.  Neck:     Vascular: No JVD.  Cardiovascular:     Rate and Rhythm: Normal rate and regular rhythm.     Heart sounds: Normal heart sounds. No murmur heard.  No friction rub. No gallop.   Pulmonary:     Effort: Pulmonary effort is normal. No respiratory distress.     Breath sounds: Normal breath sounds. No wheezing or rales.  Musculoskeletal:     Cervical back: Neck supple. Decreased range of motion.     Thoracic back: Spasms and tenderness present. Decreased range of motion.     Lumbar back: No spasms, tenderness or bony tenderness. Normal range of motion. Negative right straight leg raise test and negative left straight leg raise test.       Legs:  Neurological:     General: No focal deficit present.     Mental Status: She is alert.      No results found for any visits on 09/13/20.  Assessment & Plan     1. Myofascial pain syndrome of thoracic spine Toradol  IM given in the office and tolerated well. We monitored patient closely due to previous SOB with celebrex. She has tolerated IBU and aleve. Prednisone given as below. F/U after medications to see if symptoms return. If so, will get imaging of thoracic and lumbar spine.  - ketorolac (TORADOL) injection 60 mg - predniSONE (STERAPRED UNI-PAK 21 TAB) 10 MG (21) TBPK tablet; 6 day taper; take as directed on package instructions  Dispense: 21 tablet; Refill: 0  2. Sciatica of left side See above medical treatment plan.  Also discussed heat and massage therapy.    Return if symptoms worsen or fail to improve.      Delmer Islam, PA-C, have reviewed all documentation for this visit. The documentation on 09/14/20 for the exam, diagnosis, procedures, and orders are all accurate and complete.   Reine Just  [facility] [phone] (phone) [phone] (fax)  [facility]

## 2020-09-13 NOTE — Patient Instructions (Signed)
Myofascial Pain Syndrome and Fibromyalgia Myofascial pain syndrome and fibromyalgia are both pain disorders. This pain may be felt mainly in your muscles.  Myofascial pain syndrome: ? Always has tender points in the muscle that will cause pain when pressed (trigger points). The pain may come and go. ? Usually affects your neck, upper back, and shoulder areas. The pain often radiates into your arms and hands.  Fibromyalgia: ? Has muscle pains and tenderness that come and go. ? Is often associated with fatigue and sleep problems. ? Has trigger points. ? Tends to be long-lasting (chronic), but is not life-threatening. Fibromyalgia and myofascial pain syndrome are not the same. However, they often occur together. If you have both conditions, each can make the other worse. Both are common and can cause enough pain and fatigue to make day-to-day activities difficult. Both can be hard to diagnose because their symptoms are common in many other conditions. What are the causes? The exact causes of these conditions are not known. What increases the risk? You are more likely to develop this condition if:  You have a family history of the condition.  You have certain triggers, such as: ? Spine disorders. ? An injury (trauma) or other physical stressors. ? Being under a lot of stress. ? Medical conditions such as osteoarthritis, rheumatoid arthritis, or lupus. What are the signs or symptoms? Fibromyalgia The main symptom of fibromyalgia is widespread pain and tenderness in your muscles. Pain is sometimes described as stabbing, shooting, or burning. You may also have:  Tingling or numbness.  Sleep problems and fatigue.  Problems with attention and concentration (fibro fog). Other symptoms may include:  Bowel and bladder problems.  Headaches.  Visual problems.  Problems with odors and noises.  Depression or mood changes.  Painful menstrual periods (dysmenorrhea).  Dry skin or  eyes. These symptoms can vary over time. Myofascial pain syndrome Symptoms of myofascial pain syndrome include:  Tight, ropy bands of muscle.  Uncomfortable sensations in muscle areas. These may include aching, cramping, burning, numbness, tingling, and weakness.  Difficulty moving certain parts of the body freely (poor range of motion). How is this diagnosed? This condition may be diagnosed by your symptoms and medical history. You will also have a physical exam. In general:  Fibromyalgia is diagnosed if you have pain, fatigue, and other symptoms for more than 3 months, and symptoms cannot be explained by another condition.  Myofascial pain syndrome is diagnosed if you have trigger points in your muscles, and those trigger points are tender and cause pain elsewhere in your body (referred pain). How is this treated? Treatment for these conditions depends on the type that you have.  For fibromyalgia: ? Pain medicines, such as NSAIDs. ? Medicines for treating depression. ? Medicines for treating seizures. ? Medicines that relax the muscles.  For myofascial pain: ? Pain medicines, such as NSAIDs. ? Cooling and stretching of muscles. ? Trigger point injections. ? Sound wave (ultrasound) treatments to stimulate muscles. Treating these conditions often requires a team of health care providers. These may include:  Your primary care provider.  Physical therapist.  Complementary health care providers, such as massage therapists or acupuncturists.  Psychiatrist for cognitive behavioral therapy. Follow these instructions at home: Medicines  Take over-the-counter and prescription medicines only as told by your health care provider.  Do not drive or use heavy machinery while taking prescription pain medicine.  If you are taking prescription pain medicine, take actions to prevent or treat constipation. Your health care  provider may recommend that you: ? Drink enough fluid to keep  your urine pale yellow. ? Eat foods that are high in fiber, such as fresh fruits and vegetables, whole grains, and beans. ? Limit foods that are high in fat and processed sugars, such as fried or sweet foods. ? Take an over-the-counter or prescription medicine for constipation. Lifestyle   Exercise as directed by your health care provider or physical therapist.  Practice relaxation techniques to control your stress. You may want to try: ? Biofeedback. ? Visual imagery. ? Hypnosis. ? Muscle relaxation. ? Yoga. ? Meditation.  Maintain a healthy lifestyle. This includes eating a healthy diet and getting enough sleep.  Do not use any products that contain nicotine or tobacco, such as cigarettes and e-cigarettes. If you need help quitting, ask your health care provider. General instructions  Talk to your health care provider about complementary treatments, such as acupuncture or massage.  Consider joining a support group with others who are diagnosed with this condition.  Do not do activities that stress or strain your muscles. This includes repetitive motions and heavy lifting.  Keep all follow-up visits as told by your health care provider. This is important. Where to find more information  National Fibromyalgia Association: www.fmaware.org  Arthritis Foundation: www.arthritis.org  American Chronic Pain Association: www.theacpa.org Contact a health care provider if:  You have new symptoms.  Your symptoms get worse or your pain is severe.  You have side effects from your medicines.  You have trouble sleeping.  Your condition is causing depression or anxiety. Summary  Myofascial pain syndrome and fibromyalgia are pain disorders.  Myofascial pain syndrome has tender points in the muscle that will cause pain when pressed (trigger points). Fibromyalgia also has muscle pains and tenderness that come and go, but this condition is often associated with fatigue and sleep  disturbances.  Fibromyalgia and myofascial pain syndrome are not the same but often occur together, causing pain and fatigue that make day-to-day activities difficult.  Treatment for fibromyalgia includes taking medicines to relax the muscles and medicines for pain, depression, or seizures. Treatment for myofascial pain syndrome includes taking medicines for pain, cooling and stretching of muscles, and injecting medicines into trigger points.  Follow your health care provider's instructions for taking medicines and maintaining a healthy lifestyle. This information is not intended to replace advice given to you by your health care provider. Make sure you discuss any questions you have with your health care provider. Document Revised: 03/14/2019 Document Reviewed: 12/05/2017 Elsevier Patient Education  2020 [employer].   Sciatica Rehab Ask your health care provider which exercises are safe for you. Do exercises exactly as told by your health care provider and adjust them as directed. It is normal to feel mild stretching, pulling, tightness, or discomfort as you do these exercises. Stop right away if you feel sudden pain or your pain gets worse. Do not begin these exercises until told by your health care provider. Stretching and range-of-motion exercises These exercises warm up your muscles and joints and improve the movement and flexibility of your hips and back. These exercises also help to relieve pain, numbness, and tingling. Sciatic nerve glide 1. Sit in a chair with your head facing down toward your chest. Place your hands behind your back. Let your shoulders slump forward. 2. Slowly straighten one of your legs while you tilt your head back as if you are looking toward the ceiling. Only straighten your leg as far as you can without  making your symptoms worse. 3. Hold this position for __________ seconds. 4. Slowly return to the starting position. 5. Repeat with your other leg. Repeat  __________ times. Complete this exercise __________ times a day. Knee to chest with hip adduction and internal rotation  1. Lie on your back on a firm surface with both legs straight. 2. Bend one of your knees and move it up toward your chest until you feel a gentle stretch in your lower back and buttock. Then, move your knee toward the shoulder that is on the opposite side from your leg. This is hip adduction and internal rotation. ? Hold your leg in this position by holding on to the front of your knee. 3. Hold this position for __________ seconds. 4. Slowly return to the starting position. 5. Repeat with your other leg. Repeat __________ times. Complete this exercise __________ times a day. Prone extension on elbows  1. Lie on your abdomen on a firm surface. A bed may be too soft for this exercise. 2. Prop yourself up on your elbows. 3. Use your arms to help lift your chest up until you feel a gentle stretch in your abdomen and your lower back. ? This will place some of your body weight on your elbows. If this is uncomfortable, try stacking pillows under your chest. ? Your hips should stay down, against the surface that you are lying on. Keep your hip and back muscles relaxed. 4. Hold this position for __________ seconds. 5. Slowly relax your upper body and return to the starting position. Repeat __________ times. Complete this exercise __________ times a day. Strengthening exercises These exercises build strength and endurance in your back. Endurance is the ability to use your muscles for a long time, even after they get tired. Pelvic tilt This exercise strengthens the muscles that lie deep in the abdomen. 1. Lie on your back on a firm surface. Bend your knees and keep your feet flat on the floor. 2. Tense your abdominal muscles. Tip your pelvis up toward the ceiling and flatten your lower back into the floor. ? To help with this exercise, you may place a small towel under your lower  back and try to push your back into the towel. 3. Hold this position for __________ seconds. 4. Let your muscles relax completely before you repeat this exercise. Repeat __________ times. Complete this exercise __________ times a day. Alternating arm and leg raises  1. Get on your hands and knees on a firm surface. If you are on a hard floor, you may want to use padding, such as an exercise mat, to cushion your knees. 2. Line up your arms and legs. Your hands should be directly below your shoulders, and your knees should be directly below your hips. 3. Lift your left leg behind you. At the same time, raise your right arm and straighten it in front of you. ? Do not lift your leg higher than your hip. ? Do not lift your arm higher than your shoulder. ? Keep your abdominal and back muscles tight. ? Keep your hips facing the ground. ? Do not arch your back. ? Keep your balance carefully, and do not hold your breath. 4. Hold this position for __________ seconds. 5. Slowly return to the starting position. 6. Repeat with your right leg and your left arm. Repeat __________ times. Complete this exercise __________ times a day. Posture and body mechanics Good posture and healthy body mechanics can help to relieve stress in your  body's tissues and joints. Body mechanics refers to the movements and positions of your body while you do your daily activities. Posture is part of body mechanics. Good posture means:  Your spine is in its natural S-curve position (neutral).  Your shoulders are pulled back slightly.  Your head is not tipped forward. Follow these guidelines to improve your posture and body mechanics in your everyday activities. Standing   When standing, keep your spine neutral and your feet about hip width apart. Keep a slight bend in your knees. Your ears, shoulders, and hips should line up.  When you do a task in which you stand in one place for a long time, place one foot up on a  stable object that is 2-4 inches (5-10 cm) high, such as a footstool. This helps keep your spine neutral. Sitting   When sitting, keep your spine neutral and keep your feet flat on the floor. Use a footrest, if necessary, and keep your thighs parallel to the floor. Avoid rounding your shoulders, and avoid tilting your head forward.  When working at a desk or a computer, keep your desk at a height where your hands are slightly lower than your elbows. Slide your chair under your desk so you are close enough to maintain good posture.  When working at a computer, place your monitor at a height where you are looking straight ahead and you do not have to tilt your head forward or downward to look at the screen. Resting  When lying down and resting, avoid positions that are most painful for you.  If you have pain with activities such as sitting, bending, stooping, or squatting, lie in a position in which your body does not bend very much. For example, avoid curling up on your side with your arms and knees near your chest (fetal position).  If you have pain with activities such as standing for a long time or reaching with your arms, lie with your spine in a neutral position and bend your knees slightly. Try the following positions: ? Lying on your side with a pillow between your knees. ? Lying on your back with a pillow under your knees. Lifting   When lifting objects, keep your feet at least shoulder width apart and tighten your abdominal muscles.  Bend your knees and hips and keep your spine neutral. It is important to lift using the strength of your legs, not your back. Do not lock your knees straight out.  Always ask for help to lift heavy or awkward objects. This information is not intended to replace advice given to you by your health care provider. Make sure you discuss any questions you have with your health care provider. Document Revised: 03/14/2019 Document Reviewed:  12/12/2018 Elsevier Patient Education  2020 [employer].

## 2020-09-21 ENCOUNTER — Other Ambulatory Visit: Payer: Self-pay | Admitting: Physician Assistant

## 2020-09-21 DIAGNOSIS — K219 Gastro-esophageal reflux disease without esophagitis: Secondary | ICD-10-CM

## 2020-10-22 ENCOUNTER — Other Ambulatory Visit: Payer: Self-pay | Admitting: Physician Assistant

## 2020-10-22 DIAGNOSIS — M797 Fibromyalgia: Secondary | ICD-10-CM

## 2020-10-22 DIAGNOSIS — F411 Generalized anxiety disorder: Secondary | ICD-10-CM

## 2020-11-16 DIAGNOSIS — M5416 Radiculopathy, lumbar region: Secondary | ICD-10-CM | POA: Diagnosis not present

## 2020-11-16 DIAGNOSIS — M4316 Spondylolisthesis, lumbar region: Secondary | ICD-10-CM | POA: Diagnosis not present

## 2020-11-16 DIAGNOSIS — M5442 Lumbago with sciatica, left side: Secondary | ICD-10-CM | POA: Diagnosis not present

## 2020-11-16 DIAGNOSIS — M5441 Lumbago with sciatica, right side: Secondary | ICD-10-CM | POA: Diagnosis not present

## 2020-12-09 ENCOUNTER — Other Ambulatory Visit: Payer: Federal, State, Local not specified - PPO

## 2020-12-09 DIAGNOSIS — Z20822 Contact with and (suspected) exposure to covid-19: Secondary | ICD-10-CM

## 2020-12-13 ENCOUNTER — Ambulatory Visit: Payer: Self-pay | Admitting: *Deleted

## 2020-12-13 NOTE — Telephone Encounter (Signed)
Patient has nasal congestion and would like otc recommendations Please advise  C/o nasal congestion and ear congested since last Thursday. Denies fever, sore throat, chest pain , difficulty breathing. Reports feeling tired and when "tries to blow nose , nothing comes out". Ears , "sounds like spraying in ears". Sore throat is getting better but did spit out small amount of blood x 1 today. Care advise given. Patient reports PCP can put suggestions for nasal congestion in My Chart message to decrease workload on staff. Recommended patient contact her pharmacist for OTC recommendations. Patient requesting covid results. Not results noted at this time. Patient verbalized understanding of care advise and to call back or go to ED if symptoms worsen.   Reason for Disposition . [1] Sinus congestion as part of a cold AND [2] present < 10 days  Answer Assessment - Initial Assessment Questions 1. LOCATION: "Where does it hurt?"      Nasal area and ears 2. ONSET: "When did the sinus pain start?"  (e.g., hours, days)      Last Thursday 3. SEVERITY: "How bad is the pain?"   (Scale 1-10; mild, moderate or severe)   - MILD (1-3): doesn't interfere with normal activities    - MODERATE (4-7): interferes with normal activities (e.g., work or school) or awakens from sleep   - SEVERE (8-10): excruciating pain and patient unable to do any normal activities        Moderate  4. RECURRENT SYMPTOM: "Have you ever had sinus problems before?" If Yes, ask: "When was the last time?" and "What happened that time?"      Yes 5. NASAL CONGESTION: "Is the nose blocked?" If Yes, ask: "Can you open it or must you breathe through the mouth?"     Tries to blow nose but nothing comes out  6. NASAL DISCHARGE: "Do you have discharge from your nose?" If so ask, "What color?"     na 7. FEVER: "Do you have a fever?" If Yes, ask: "What is it, how was it measured, and when did it start?"      na 8. OTHER SYMPTOMS: "Do you have any other  symptoms?" (e.g., sore throat, cough, earache, difficulty breathing)     Sore throat, ear "sound like spraying in ears"  9. PREGNANCY: "Is there any chance you are pregnant?" "When was your last menstrual period?"     na  Protocols used: SINUS PAIN OR CONGESTION-A-AH

## 2020-12-14 ENCOUNTER — Encounter: Payer: Self-pay | Admitting: Physician Assistant

## 2020-12-14 LAB — NOVEL CORONAVIRUS, NAA: SARS-CoV-2, NAA: NOT DETECTED

## 2020-12-15 MED ORDER — AZITHROMYCIN 250 MG PO TABS
ORAL_TABLET | ORAL | 0 refills | Status: DC
Start: 2020-12-15 — End: 2021-01-07

## 2020-12-20 ENCOUNTER — Telehealth: Payer: Self-pay

## 2020-12-20 MED ORDER — AMOXICILLIN-POT CLAVULANATE 875-125 MG PO TABS
1.0000 | ORAL_TABLET | Freq: Two times a day (BID) | ORAL | 0 refills | Status: DC
Start: 2020-12-20 — End: 2021-01-07

## 2020-12-20 NOTE — Telephone Encounter (Signed)
Patient sent in zpak but not responding. Will change to augmentin.

## 2020-12-20 NOTE — Telephone Encounter (Signed)
Copied from CRM [id number]. Topic: General - Other >> Dec 17, 2020  4:24 PM Tamela Oddi wrote: Reason for CRM: Patient is calling to inform the doctor that she is still not feeling well with the medication she prescribed.  She stated that the doctor told her to call if she felt worse.  Please advise and call patient to discuss at [phone]

## 2020-12-21 ENCOUNTER — Other Ambulatory Visit: Payer: Self-pay | Admitting: Physician Assistant

## 2020-12-21 DIAGNOSIS — R0602 Shortness of breath: Secondary | ICD-10-CM

## 2020-12-21 NOTE — Telephone Encounter (Signed)
Patient advised as below.  

## 2021-01-07 ENCOUNTER — Ambulatory Visit (INDEPENDENT_AMBULATORY_CARE_PROVIDER_SITE_OTHER): Payer: Federal, State, Local not specified - PPO | Admitting: Physician Assistant

## 2021-01-07 ENCOUNTER — Encounter: Payer: Self-pay | Admitting: Physician Assistant

## 2021-01-07 ENCOUNTER — Other Ambulatory Visit: Payer: Self-pay

## 2021-01-07 VITALS — BP 144/84 | HR 75 | Temp 98.8°F | Wt 226.0 lb

## 2021-01-07 DIAGNOSIS — M797 Fibromyalgia: Secondary | ICD-10-CM | POA: Diagnosis not present

## 2021-01-07 DIAGNOSIS — B370 Candidal stomatitis: Secondary | ICD-10-CM

## 2021-01-07 DIAGNOSIS — R5382 Chronic fatigue, unspecified: Secondary | ICD-10-CM

## 2021-01-07 DIAGNOSIS — Z6835 Body mass index (BMI) 35.0-35.9, adult: Secondary | ICD-10-CM | POA: Diagnosis not present

## 2021-01-07 DIAGNOSIS — F411 Generalized anxiety disorder: Secondary | ICD-10-CM | POA: Diagnosis not present

## 2021-01-07 MED ORDER — PHENTERMINE HCL 37.5 MG PO CAPS: 37.5000 mg | ORAL_CAPSULE | ORAL | 1 refills | Status: DC

## 2021-01-07 MED ORDER — CLONAZEPAM 0.5 MG PO TABS: 0.2500 mg | ORAL_TABLET | Freq: Two times a day (BID) | ORAL | 0 refills | Status: AC | PRN

## 2021-01-07 MED ORDER — NYSTATIN 100000 UNIT/ML MT SUSP: 5.0000 mL | Freq: Four times a day (QID) | OROMUCOSAL | 0 refills | Status: DC

## 2021-01-07 NOTE — Progress Notes (Signed)
Established patient visit   Patient: Jane [name]   DOB: 1961/09/10   60 y.o. Female  MRN: [id number] Visit Date: 01/07/2021  Today's healthcare provider: [name], PA-C   No chief complaint on file.  Subjective    HPI  Fatigue  She reports chronic fatigue which she describes as a lack of energy and feeling weak. It began several months ago and occurs all the time. It is described as moderate and gradually worsening.  Associated symptoms: Yes arthralgias No bleeding  No melena No chest discomfort  No heart palpitations No heart racing   No dyspnea Unsureg feeling depressed  No feeling anxious or under stress No fevers  No loss of appetite No nausea  No vomiting No sleeping problems    Wt Readings from Last 3 Encounters:  01/07/21 226 lb (102.5 kg)  09/13/20 218 lb 3.2 oz (99 kg)  08/06/20 214 lb (97.1 kg)    Lab Results  Component Value Date   WBC 6.7 04/14/2020   HGB 13.7 04/14/2020   HCT 41.1 04/14/2020   MCV 86.7 04/14/2020   PLT 255 04/14/2020   Lab Results  Component Value Date   TSH 1.780 04/05/2020   Lab Results  Component Value Date   NA 138 04/14/2020   K 3.8 04/14/2020   CO2 25 04/14/2020   BUN 19 04/14/2020   CREATININE 0.72 04/14/2020   CALCIUM 8.8 (L) 04/14/2020   GLUCOSE 142 (H) 04/14/2020     ---------------------------------------------------------------------------------------------------   Patient Active Problem List   Diagnosis Date Noted  . Essential hypertension 04/05/2020  . Rectal inflammation   . Diverticulosis of large intestine without diverticulitis   . Gastric polyp   . Esophageal dysphagia   . Localized primary carpometacarpal osteoarthrosis, right 08/18/2017  . 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 Vitamin D deficiency 09/23/2016  . Glucose intolerance (impaired glucose tolerance) 01/01/2016  . Class 1 obesity due to excess calories with serious comorbidity and body mass index (BMI) of  33.0 to 33.9 in adult 01/01/2016  . Gastroesophageal reflux disease without esophagitis 01/01/2016  . Asthma, mild intermittent 01/01/2016  . GAD (generalized anxiety disorder) 11/09/2014  . Osteoarthritis, knee 10/20/2012  . Fibromyalgia 10/20/2012  . Hypothyroidism following radioiodine therapy 10/20/2012  . Allergic rhinitis 02/24/2010  . Hyperlipidemia 12/31/2009  . MURMUR 12/31/2009   Past Medical History:  Diagnosis Date  . Allergy   . Arthritis    Knees, feet, elbows , neck  . Asthma    mild; assoc with seasonal allergies; uses Albuterol once per year on average.  Marland Kitchen Atypical chest pain    hx   . Complication of anesthesia    nausea, last 2 surgeries nausea much less  . Fibromyalgia 12/04/2010   [facility] in [place]; Dr. Gwenevere Ghazi; followed every six months.  . Fibromyalgia   . GERD (gastroesophageal reflux disease)   . Heart murmur    Mitral valve Prolapse  . HLD (hyperlipidemia)   . Hypothyroidism post radioactive iodine   Hyperthyroidism; now secondary hypothyroidism  . MR (mitral regurgitation)    moderate; probably MVP. echo 3/10 at [facility] cardiology with normal EF, moderate MR    Social History   Tobacco Use  . Smoking status: Former Smoker    Packs/day: 2.00    Years: 15.00    Pack years: 30.00    Types: Cigarettes    Quit date: 10/12/1994    Years since quitting: 26.2  . Smokeless  tobacco: Never Used  . Tobacco comment: smoked 2ppd for 15 years; quit in 1995   Vaping Use  . Vaping Use: Never used  Substance Use Topics  . Alcohol use: Yes    Alcohol/week: 0.0 standard drinks    Comment: social, rare  . Drug use: No   Allergies  Allergen Reactions  . Celebrex [Celecoxib] Swelling and Shortness Of Breath    Swelling. Developed Facial swelling   . Atorvastatin Other (See Comments)    myalgia     Medications: Outpatient Medications Prior to Visit  Medication Sig  . amoxicillin-clavulanate (AUGMENTIN) 875-125 MG tablet Take 1 tablet  by mouth 2 (two) times daily.  Marland Kitchen azithromycin (ZITHROMAX) 250 MG tablet Take 2 tablets PO on day one, and one tablet PO daily thereafter until completed.  . clonazePAM (KLONOPIN) 0.5 MG tablet Take 0.5-1 tablets (0.25-0.5 mg total) by mouth 2 (two) times daily as needed for anxiety.  . conjugated estrogens (PREMARIN) vaginal cream Place 1 Applicatorful vaginally daily.  Marland Kitchen levothyroxine (SYNTHROID) 112 MCG tablet TAKE ONE TABLET EVERY DAY 30 MINUTES BEFORE BREAKFAST  . losartan (COZAAR) 100 MG tablet Take 1 tablet (100 mg total) by mouth daily.  . meloxicam (MOBIC) 15 MG tablet Take 15 mg by mouth daily.   . montelukast (SINGULAIR) 10 MG tablet TAKE ONE TABLET (10 MG) BY MOUTH AT BEDTIME  . mupirocin ointment (BACTROBAN) 2 % Place 1 application into the nose 2 (two) times daily.  Marland Kitchen omeprazole (PRILOSEC) 20 MG capsule TAKE 1 CAPSULE BY MOUTH TWICE DAILY BEFORE MEALS  . predniSONE (STERAPRED UNI-PAK 21 TAB) 10 MG (21) TBPK tablet 6 day taper; take as directed on package instructions  . traMADol (ULTRAM) 50 MG tablet Take 50 mg by mouth every 6 (six) hours as needed.   . venlafaxine XR (EFFEXOR-XR) 150 MG 24 hr capsule TAKE 1 CAPSULE BY MOUTH DAILY WITH BREAKFAST.   No facility-administered medications prior to visit.    Review of Systems  Constitutional: Positive for diaphoresis, fatigue and unexpected weight change. Negative for activity change, appetite change, chills and fever.  Respiratory: Positive for apnea. Negative for cough, choking, chest tightness, shortness of breath, wheezing and stridor.   Cardiovascular: Negative.   Gastrointestinal: Positive for nausea (Occasionally). Negative for abdominal distention, abdominal pain, anal bleeding, blood in stool, constipation, diarrhea, rectal pain and vomiting.  Endocrine: Positive for heat intolerance. Negative for cold intolerance, polydipsia, polyphagia and polyuria.  Musculoskeletal: Positive for myalgias. Negative for arthralgias, back  pain, gait problem, joint swelling, neck pain and neck stiffness.  Neurological: Positive for weakness. Negative for dizziness, light-headedness, numbness and headaches.        Objective    BP (!) 144/84 (BP Location: Left Arm, Patient Position: Sitting, Cuff Size: Large)   Pulse 75   Temp 98.8 F (37.1 C) (Oral)   Wt 226 lb (102.5 kg)   BMI 35.40 kg/m    Physical Exam Vitals reviewed.  Constitutional:      General: She is not in acute distress.    Appearance: Normal appearance. She is well-developed and well-nourished. She is obese. She is not ill-appearing.  HENT:     Head: Normocephalic and atraumatic.  Eyes:     Extraocular Movements: EOM normal.  Pulmonary:     Effort: Pulmonary effort is normal. No respiratory distress.  Musculoskeletal:     Cervical back: Normal range of motion and neck supple.  Neurological:     Mental Status: She is alert.  Psychiatric:  Mood and Affect: Mood and affect and mood normal.        Behavior: Behavior normal.        Thought Content: Thought content normal.        Judgment: Judgment normal.      No results found for any visits on 01/07/21.  Assessment & Plan     1. Thrush Patient recently completed an antibiotic for a sinus infection and reports she has irritation and soreness of the tongue with some redness and white patches. Suspected thrush from the antibiotic. Nystatin prescribed as below. - nystatin (MYCOSTATIN) 100000 UNIT/ML suspension; Take 5 mLs (500,000 Units total) by mouth 4 (four) times daily.  Dispense: 60 mL; Refill: 0  2. Chronic fatigue Will check labs as below and f/u pending results. - CBC w/Diff/Platelet - Basic Metabolic Panel (BMET) - TSH - Vitamin D (25 hydroxy) - B12 and Folate Panel - HgB A1c - Fe+TIBC+Fer  3. Fibromyalgia Stable. Diagnosis pulled for medication refill. Continue current medical treatment plan. - clonazePAM (KLONOPIN) 0.5 MG tablet; Take 0.5-1 tablets (0.25-0.5 mg total) by  mouth 2 (two) times daily as needed for anxiety.  Dispense: 60 tablet; Refill: 0  4. GAD (generalized anxiety disorder) Stable. Diagnosis pulled for medication refill. Continue current medical treatment plan. - clonazePAM (KLONOPIN) 0.5 MG tablet; Take 0.5-1 tablets (0.25-0.5 mg total) by mouth 2 (two) times daily as needed for anxiety.  Dispense: 60 tablet; Refill: 0  5. Class 2 severe obesity due to excess calories with serious comorbidity and body mass index (BMI) of 35.0 to 35.9 in adult [facility]) Counseled patient on healthy lifestyle modifications including dieting and exercise.  Will start phentermine as below.  - phentermine 37.5 MG capsule; Take 1 capsule (37.5 mg total) by mouth every morning.  Dispense: 30 capsule; Refill: 1   No follow-ups on file.      Delmer Islam, PA-C, have reviewed all documentation for this visit. The documentation on 01/07/21 for the exam, diagnosis, procedures, and orders are all accurate and complete.   Reine Just  [facility] [phone] (phone) [phone] (fax)  [facility]

## 2021-01-07 NOTE — Patient Instructions (Signed)
Phentermine tablets or capsules What is this medicine? PHENTERMINE (FEN ter meen) decreases your appetite. It is used with a reduced calorie diet and exercise to help you lose weight. This medicine may be used for other purposes; ask your health care provider or pharmacist if you have questions. COMMON BRAND NAME(S): Adipex-P, Atti-Plex P, Atti-Plex P Spansule, Fastin, Lomaira, Pro-Fast, Tara-8 What should I tell my health care provider before I take this medicine? They need to know if you have any of these conditions:  agitation or nervousness  diabetes  glaucoma  heart disease  high blood pressure  history of drug abuse or addiction  history of stroke  kidney disease  lung disease called Primary Pulmonary Hypertension (PPH)  taken an MAOI like Carbex, Eldepryl, Marplan, Nardil, or Parnate in last 14 days  taking stimulant medicines for attention disorders, weight loss, or to stay awake  thyroid disease  an unusual or allergic reaction to phentermine, other medicines, foods, dyes, or preservatives  pregnant or trying to get pregnant  breast-feeding How should I use this medicine? Take this medicine by mouth with a glass of water. Follow the directions on the prescription label. Take your medicine at regular intervals. Do not take it more often than directed. Do not stop taking except on your doctor's advice. Talk to your pediatrician regarding the use of this medicine in children. While this drug may be prescribed for children 17 years or older for selected conditions, precautions do apply. Overdosage: If you think you have taken too much of this medicine contact a poison control center or emergency room at once. NOTE: This medicine is only for you. Do not share this medicine with others. What if I miss a dose? If you miss a dose, take it as soon as you can. If it is almost time for your next dose, take only that dose. Do not take double or extra doses. What may interact  with this medicine? Do not take this medicine with any of the following medications:  MAOIs like Carbex, Eldepryl, Marplan, Nardil, and Parnate This medicine may also interact with the following medications:  alcohol  certain medicines for depression, anxiety, or psychotic disorders  certain medicines for high blood pressure  linezolid  medicines for colds or breathing difficulties like pseudoephedrine or phenylephrine  medicines for diabetes  sibutramine  stimulant medicines for attention disorders, weight loss, or to stay awake This list may not describe all possible interactions. Give your health care provider a list of all the medicines, herbs, non-prescription drugs, or dietary supplements you use. Also tell them if you smoke, drink alcohol, or use illegal drugs. Some items may interact with your medicine. What should I watch for while using this medicine? Visit your doctor or health care provider for regular checks on your progress. Do not stop taking except on your health care provider's advice. You may develop a severe reaction. Your health care provider will tell you how much medicine to take. Do not take this medicine close to bedtime. It may prevent you from sleeping. You may get drowsy or dizzy. Do not drive, use machinery, or do anything that needs mental alertness until you know how this medicine affects you. Do not stand or sit up quickly, especially if you are an older patient. This reduces the risk of dizzy or fainting spells. Alcohol may increase dizziness and drowsiness. Avoid alcoholic drinks. This medicine may affect blood sugar levels. Ask your healthcare provider if changes in diet or medicines are needed   if you have diabetes. Women should inform their health care provider if they wish to become pregnant or think they might be pregnant. Losing weight while pregnant is not advised and may cause harm to the unborn child. Talk to your health care provider for more  information. What side effects may I notice from receiving this medicine? Side effects that you should report to your doctor or health care professional as soon as possible:  allergic reactions like skin rash, itching or hives, swelling of the face, lips, or tongue  breathing problems  changes in emotions or moods  changes in vision  chest pain or chest tightness  fast, irregular heartbeat  feeling faint or lightheaded  increased blood pressure  irritable  restlessness  tremors  seizures  signs and symptoms of a stroke like changes in vision; confusion; trouble speaking or understanding; severe headaches; sudden numbness or weakness of the face, arm or leg; trouble walking; dizziness; loss of balance or coordination  unusually weak or tired Side effects that usually do not require medical attention (report to your doctor or health care professional if they continue or are bothersome):  changes in taste  constipation or diarrhea  dizziness  dry mouth  headache  trouble sleeping  upset stomach This list may not describe all possible side effects. Call your doctor for medical advice about side effects. You may report side effects to FDA at 1-800-FDA-1088. Where should I keep my medicine? Keep out of the reach of children. This medicine can be abused. Keep your medicine in a safe place to protect it from theft. Do not share this medicine with anyone. Selling or giving away this medicine is dangerous and against the law. This medicine may cause harm and death if it is taken by other adults, children, or pets. Return medicine that has not been used to an official disposal site. Contact the DEA at 1-800-882-9539 or your city/county government to find a site. If you cannot return the medicine, mix any unused medicine with a substance like cat litter or coffee grounds. Then throw the medicine away in a sealed container like a sealed bag or coffee can with a lid. Do not use the  medicine after the expiration date. Store at room temperature between 20 and 25 degrees C (68 and 77 degrees F). Keep container tightly closed. NOTE: This sheet is a summary. It may not cover all possible information. If you have questions about this medicine, talk to your doctor, pharmacist, or health care provider.  2021 Elsevier/Gold Standard (2019-09-26 12:54:20)  

## 2021-01-08 LAB — IRON,TIBC AND FERRITIN PANEL
Ferritin: 122 ng/mL (ref 15–150)
Iron Saturation: 27 % (ref 15–55)
Iron: 84 ug/dL (ref 27–159)
Total Iron Binding Capacity: 313 ug/dL (ref 250–450)
UIBC: 229 ug/dL (ref 131–425)

## 2021-01-08 LAB — CBC WITH DIFFERENTIAL/PLATELET
Basophils Absolute: 0 10*3/uL (ref 0.0–0.2)
Basos: 1 %
EOS (ABSOLUTE): 0.2 10*3/uL (ref 0.0–0.4)
Eos: 3 %
Hematocrit: 42.2 % (ref 34.0–46.6)
Hemoglobin: 14 g/dL (ref 11.1–15.9)
Immature Grans (Abs): 0 10*3/uL (ref 0.0–0.1)
Immature Granulocytes: 0 %
Lymphocytes Absolute: 1.3 10*3/uL (ref 0.7–3.1)
Lymphs: 22 %
MCH: 28.3 pg (ref 26.6–33.0)
MCHC: 33.2 g/dL (ref 31.5–35.7)
MCV: 85 fL (ref 79–97)
Monocytes Absolute: 0.6 10*3/uL (ref 0.1–0.9)
Monocytes: 10 %
Neutrophils Absolute: 3.7 10*3/uL (ref 1.4–7.0)
Neutrophils: 64 %
Platelets: 273 10*3/uL (ref 150–450)
RBC: 4.95 x10E6/uL (ref 3.77–5.28)
RDW: 13.1 % (ref 11.7–15.4)
WBC: 5.8 10*3/uL (ref 3.4–10.8)

## 2021-01-08 LAB — BASIC METABOLIC PANEL
BUN/Creatinine Ratio: 25 (ref 12–28)
BUN: 19 mg/dL (ref 8–27)
CO2: 26 mmol/L (ref 20–29)
Calcium: 10 mg/dL (ref 8.7–10.3)
Chloride: 103 mmol/L (ref 96–106)
Creatinine, Ser: 0.75 mg/dL (ref 0.57–1.00)
GFR calc Af Amer: 100 mL/min/{1.73_m2} (ref >59)
GFR calc non Af Amer: 87 mL/min/{1.73_m2} (ref >59)
Glucose: 97 mg/dL (ref 65–99)
Potassium: 4.8 mmol/L (ref 3.5–5.2)
Sodium: 142 mmol/L (ref 134–144)

## 2021-01-08 LAB — VITAMIN D 25 HYDROXY (VIT D DEFICIENCY, FRACTURES): Vit D, 25-Hydroxy: 26.4 ng/mL — ABNORMAL LOW (ref 30.0–100.0)

## 2021-01-08 LAB — HEMOGLOBIN A1C
Est. average glucose Bld gHb Est-mCnc: 131 mg/dL
Hgb A1c MFr Bld: 6.2 % — ABNORMAL HIGH (ref 4.8–5.6)

## 2021-01-08 LAB — B12 AND FOLATE PANEL
Folate: 10.5 ng/mL (ref >3.0)
Vitamin B-12: 513 pg/mL (ref 232–1245)

## 2021-01-08 LAB — TSH: TSH: 2.47 u[IU]/mL (ref 0.450–4.500)

## 2021-01-24 ENCOUNTER — Telehealth: Payer: Self-pay

## 2021-01-24 NOTE — Telephone Encounter (Signed)
Pt wanted to know if her venlafaxine is reducing the effectiveness  of phentermine. She feels like she should have lost more than 6 pounds.     Thanks,   -Vernona Rieger

## 2021-01-24 NOTE — Telephone Encounter (Signed)
6 pounds is a normal amount of weight loss for phentermine with an average of 2 pounds per week. I do not think venlafaxine is affecting.

## 2021-01-24 NOTE — Telephone Encounter (Signed)
Copied from CRM [id number]. Topic: General - Other >> Jan 24, 2021  1:30 PM Mcneil, Ja-Kwan wrote: Reason for CRM: Pt stated she has been taking phentermine 37.5 MG capsule for about 3 weeks now and she has only loss 6 pounds. Pt requests call back to discuss. Cb# [phone]

## 2021-01-25 NOTE — Telephone Encounter (Signed)
Pt advised.   Thanks,   -Marlet Korte  

## 2021-01-28 ENCOUNTER — Other Ambulatory Visit: Payer: Self-pay | Admitting: Physician Assistant

## 2021-01-28 DIAGNOSIS — F411 Generalized anxiety disorder: Secondary | ICD-10-CM

## 2021-01-28 DIAGNOSIS — M797 Fibromyalgia: Secondary | ICD-10-CM

## 2021-01-29 ENCOUNTER — Other Ambulatory Visit: Payer: Self-pay | Admitting: Physician Assistant

## 2021-01-29 DIAGNOSIS — E890 Postprocedural hypothyroidism: Secondary | ICD-10-CM

## 2021-01-29 NOTE — Telephone Encounter (Signed)
Requested Prescriptions  Pending Prescriptions Disp Refills  . levothyroxine (SYNTHROID) 112 MCG tablet [Pharmacy Med Name: LEVOTHYROXINE SODIUM 112 MCG TAB] 90 tablet 3    Sig: TAKE 1 TABLET BY MOUTH DAILY 30 MINUTES BEFORE BREAKFAST     Endocrinology:  Hypothyroid Agents Failed - 01/29/2021  8:50 AM      Failed - TSH needs to be rechecked within 3 months after an abnormal result. Refill until TSH is due.      Passed - TSH in normal range and within 360 days    TSH  Date Value Ref Range Status  01/07/2021 2.470 0.450 - 4.500 uIU/mL Final         Passed - Valid encounter within last 12 months    Recent Outpatient Visits          3 weeks ago [facility], [place], [place]   4 months ago Myofascial pain syndrome of thoracic spine   [facility] Joycelyn Man M, [place]   5 months ago Acute right-sided thoracic back pain   [facility] Osvaldo Angst M, [place]   7 months ago Herpes zoster lesion   [facility] Chrismon, Jodell Cipro, PA-C   8 months ago SOB (shortness of breath)   [facility], [place], [place]

## 2021-02-02 ENCOUNTER — Other Ambulatory Visit: Payer: Self-pay | Admitting: Physician Assistant

## 2021-02-02 DIAGNOSIS — Z1231 Encounter for screening mammogram for malignant neoplasm of breast: Secondary | ICD-10-CM

## 2021-02-02 NOTE — Telephone Encounter (Signed)
Requested medication (s) are due for refill today: yes  Requested medication (s) are on the active medication list: yes  Last refill:  01/07/21  Future visit scheduled: no  Notes to clinic:  not delegated    Requested Prescriptions  Pending Prescriptions Disp Refills   phentermine 37.5 MG capsule [Pharmacy Med Name: PHENTERMINE HCL 37.5 MG CAP] 30 capsule     Sig: TAKE 1 CAPSULE BY MOUTH EVERY MORNING      Not Delegated - Gastroenterology:  Antiobesity Agents Failed - 02/02/2021 10:07 AM      Failed - This refill cannot be delegated      Failed - Last BP in normal range    BP Readings from Last 1 Encounters:  01/07/21 (!) 144/84          Passed - Last Heart Rate in normal range    Pulse Readings from Last 1 Encounters:  01/07/21 75          Passed - Valid encounter within last 12 months    Recent Outpatient Visits           3 weeks ago [facility], [place], [place]   4 months ago Myofascial pain syndrome of thoracic spine   [facility] Joycelyn Man M, [place]   6 months ago Acute right-sided thoracic back pain   [facility] Osvaldo Angst M, [place]   7 months ago Herpes zoster lesion   [facility] Chrismon, Jodell Cipro, PA-C   8 months ago SOB (shortness of breath)   [facility], [place], [place]

## 2021-02-03 ENCOUNTER — Other Ambulatory Visit: Payer: Self-pay

## 2021-02-03 ENCOUNTER — Ambulatory Visit
Admission: RE | Admit: 2021-02-03 | Discharge: 2021-02-03 | Disposition: A | Discharge status: Home / Self Care | Payer: Federal, State, Local not specified - PPO | Source: Ambulatory Visit | Attending: Physician Assistant | Admitting: Physician Assistant

## 2021-02-03 DIAGNOSIS — Z1231 Encounter for screening mammogram for malignant neoplasm of breast: Secondary | ICD-10-CM | POA: Diagnosis not present

## 2021-02-14 ENCOUNTER — Ambulatory Visit: Payer: Self-pay | Admitting: *Deleted

## 2021-02-14 NOTE — Telephone Encounter (Signed)
Intermittent but almost daily her toes feel as though they are frozen, with numbness-does not feel cool to touch. Mostly 2nd toe is purplish-bluish on top and bottom of toe appears white. Does not help to add socks/shoes. Occurring several months now. Just once, on 02/13/21 while getting up a pain shot from her back down her legs to the feet-none since. Denies all other symptoms today.No pain just uncomfortable. Tried larger shoes and loose socks-did not help.Appointment made for 02/18/21 with pcp. Earlier time offered-preferred to see Jane [name], Jane [name].  Reason for Disposition . [1] Numbness or tingling in one or both hands AND [2] is a chronic symptom (recurrent or ongoing AND present > 4 weeks)  Answer Assessment - Initial Assessment Questions 1. SYMPTOM: "What is the main symptom you are concerned about?" (e.g., weakness, numbness)     Toes turning purplish on top and white on bottom 2. ONSET: "When did this start?" (minutes, hours, days; while sleeping)     5 weeks agp 3. LAST NORMAL: "When was the last time you were normal (no symptoms)?"      4. PATTERN "Does this come and go, or has it been constant since it started?"  "Is it present now?"     Comes and goes 5. CARDIAC SYMPTOMS: "Have you had any of the following symptoms: chest pain, difficulty breathing, palpitations?"     no 6. NEUROLOGIC SYMPTOMS: "Have you had any of the following symptoms: headache, dizziness, vision loss, double vision, changes in speech, unsteady on your feet?"     none 7. OTHER SYMPTOMS: "Do you have any other symptoms?"     no 8. PREGNANCY: "Is there any chance you are pregnant?" "When was your last menstrual period?"     na  Protocols used: NEUROLOGIC DEFICIT-A-AH

## 2021-02-18 ENCOUNTER — Encounter: Payer: Self-pay | Admitting: Physician Assistant

## 2021-02-18 ENCOUNTER — Ambulatory Visit: Payer: Federal, State, Local not specified - PPO | Admitting: Physician Assistant

## 2021-02-18 ENCOUNTER — Other Ambulatory Visit: Payer: Self-pay

## 2021-02-18 VITALS — BP 150/82 | HR 80 | Temp 98.4°F | Wt 214.0 lb

## 2021-02-18 DIAGNOSIS — M791 Myalgia, unspecified site: Secondary | ICD-10-CM

## 2021-02-18 DIAGNOSIS — G629 Polyneuropathy, unspecified: Secondary | ICD-10-CM

## 2021-02-18 DIAGNOSIS — R61 Generalized hyperhidrosis: Secondary | ICD-10-CM | POA: Diagnosis not present

## 2021-02-18 MED ORDER — QBREXZA 2.4 % EX PADS: 1.0000 | MEDICATED_PAD | Freq: Every day | CUTANEOUS | 5 refills | Status: DC

## 2021-02-18 MED ORDER — CYCLOBENZAPRINE HCL 5 MG PO TABS: 5.0000 mg | ORAL_TABLET | Freq: Three times a day (TID) | ORAL | 1 refills | Status: DC | PRN

## 2021-02-18 NOTE — Patient Instructions (Signed)
Neuropathic Pain Neuropathic pain is pain caused by damage to the nerves that are responsible for certain sensations in your body (sensory nerves). The pain can be caused by:  Damage to the sensory nerves that send signals to your spinal cord and brain (peripheral nervous system).  Damage to the sensory nerves in your brain or spinal cord (central nervous system). Neuropathic pain can make you more sensitive to pain. Even a minor sensation can feel very painful. This is usually a long-term condition that can be difficult to treat. The type of pain differs from person to person. It may:  Start suddenly (acute), or it may develop slowly and last for a long time (chronic).  Come and go as damaged nerves heal, or it may stay at the same level for years.  Cause emotional distress, loss of sleep, and a lower quality of life. What are the causes? The most common cause of this condition is diabetes. Many other diseases and conditions can also cause neuropathic pain. Causes of neuropathic pain can be classified as:  Toxic. This is caused by medicines and chemicals. The most common cause of toxic neuropathic pain is damage from cancer treatments (chemotherapy).  Metabolic. This can be caused by: ? Diabetes. This is the most common disease that damages the nerves. ? Lack of vitamin B from long-term alcohol abuse.  Traumatic. Any injury that cuts, crushes, or stretches a nerve can cause damage and pain. A common example is feeling pain after losing an arm or leg (phantom limb pain).  Compression-related. If a sensory nerve gets trapped or compressed for a long period of time, the blood supply to the nerve can be cut off.  Vascular. Many blood vessel diseases can cause neuropathic pain by decreasing blood supply and oxygen to nerves.  Autoimmune. This type of pain results from diseases in which the body's defense system (immune system) mistakenly attacks sensory nerves. Examples of autoimmune diseases  that can cause neuropathic pain include lupus and multiple sclerosis.  Infectious. Many types of viral infections can damage sensory nerves and cause pain. Shingles infection is a common cause of this type of pain.  Inherited. Neuropathic pain can be a symptom of many diseases that are passed down through families (genetic). What increases the risk? You are more likely to develop this condition if:  You have diabetes.  You smoke.  You drink too much alcohol.  You are taking certain medicines, including medicines that kill cancer cells (chemotherapy) or that treat immune system disorders. What are the signs or symptoms? The main symptom is pain. Neuropathic pain is often described as:  Burning.  Shock-like.  Stinging.  Hot or cold.  Itching. How is this diagnosed? No single test can diagnose neuropathic pain. It is diagnosed based on:  Physical exam and your symptoms. Your health care provider will ask you about your pain. You may be asked to use a pain scale to describe how bad your pain is.  Tests. These may be done to see if you have a high sensitivity to pain and to help find the cause and location of any sensory nerve damage. They include: ? Nerve conduction studies to test how well nerve signals travel through your sensory nerves (electrodiagnostic testing). ? Stimulating your sensory nerves through electrodes on your skin and measuring the response in your spinal cord and brain (somatosensory evoked potential).  Imaging studies, such as: ? X-rays. ? CT scan. ? MRI. How is this treated? Treatment for neuropathic pain may change   over time. You may need to try different treatment options or a combination of treatments. Some options include:  Treating the underlying cause of the neuropathy, such as diabetes, kidney disease, or vitamin deficiencies.  Stopping medicines that can cause neuropathy, such as chemotherapy.  Medicine to relieve pain. Medicines may  include: ? Prescription or over-the-counter pain medicine. ? Anti-seizure medicine. ? Antidepressant medicines. ? Pain-relieving patches that are applied to painful areas of skin. ? A medicine to numb the area (local anesthetic), which can be injected as a nerve block.  Transcutaneous nerve stimulation. This uses electrical currents to block painful nerve signals. The treatment is painless.  Alternative treatments, such as: ? Acupuncture. ? Meditation. ? Massage. ? Physical therapy. ? Pain management programs. ? Counseling. Follow these instructions at home: Medicines  Take over-the-counter and prescription medicines only as told by your health care provider.  Do not drive or use heavy machinery while taking prescription pain medicine.  If you are taking prescription pain medicine, take actions to prevent or treat constipation. Your health care provider may recommend that you: ? Drink enough fluid to keep your urine pale yellow. ? Eat foods that are high in fiber, such as fresh fruits and vegetables, whole grains, and beans. ? Limit foods that are high in fat and processed sugars, such as fried or sweet foods. ? Take an over-the-counter or prescription medicine for constipation.   Lifestyle  Have a good support system at home.  Consider joining a chronic pain support group.  Do not use any products that contain nicotine or tobacco, such as cigarettes and e-cigarettes. If you need help quitting, ask your health care provider.  Do not drink alcohol.   General instructions  Learn as much as you can about your condition.  Work closely with all your health care providers to find the treatment plan that works best for you.  Ask your health care provider what activities are safe for you.  Keep all follow-up visits as told by your health care provider. This is important. Contact a health care provider if:  Your pain treatments are not working.  You are having side effects  from your medicines.  You are struggling with tiredness (fatigue), mood changes, depression, or anxiety. Summary  Neuropathic pain is pain caused by damage to the nerves that are responsible for certain sensations in your body (sensory nerves).  Neuropathic pain may come and go as damaged nerves heal, or it may stay at the same level for years.  Neuropathic pain is usually a long-term condition that can be difficult to treat. Consider joining a chronic pain support group. This information is not intended to replace advice given to you by your health care provider. Make sure you discuss any questions you have with your health care provider. Document Revised: 03/13/2019 Document Reviewed: 12/07/2017 Elsevier Patient Education  2021 Elsevier Inc.  

## 2021-02-18 NOTE — Progress Notes (Signed)
Established patient visit   Patient: Jane [name]   DOB: Dec 23, 1960   60 y.o. Female  MRN: [id number] Visit Date: 02/18/2021  Today's healthcare provider: [name], PA-C   Chief Complaint  Patient presents with  . Numbness   Subjective    HPI  Numbness: Pt comes in today reports numbness and redness in both feet started about six weeks ago. 2nd toe is bluish-purple on top and white on bottom.  She reports this has been going on for a while. Feels numbness starts in the 2nd toe and then goes to the other three lateral toes and up the back of the legs. Had one episode of sharp, shooting pain down the left leg. No loss of balance and gait instability. No loss of bowel and bladder control.    Patient Active Problem List   Diagnosis Date Noted  . Essential hypertension 04/05/2020  . Rectal inflammation   . Diverticulosis of large intestine without diverticulitis   . Gastric polyp   . Esophageal dysphagia   . Localized primary carpometacarpal osteoarthrosis, right 08/18/2017  . 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 Vitamin D deficiency 09/23/2016  . Glucose intolerance (impaired glucose tolerance) 01/01/2016  . Class 1 obesity due to excess calories with serious comorbidity and body mass index (BMI) of 33.0 to 33.9 in adult 01/01/2016  . Gastroesophageal reflux disease without esophagitis 01/01/2016  . Asthma, mild intermittent 01/01/2016  . GAD (generalized anxiety disorder) 11/09/2014  . Osteoarthritis, knee 10/20/2012  . Fibromyalgia 10/20/2012  . Hypothyroidism following radioiodine therapy 10/20/2012  . Allergic rhinitis 02/24/2010  . Hyperlipidemia 12/31/2009  . MURMUR 12/31/2009   Past Medical History:  Diagnosis Date  . Allergy   . Arthritis    Knees, feet, elbows , neck  . Asthma    mild; assoc with seasonal allergies; uses Albuterol once per year on average.  Marland Kitchen Atypical chest pain    hx   . Complication of anesthesia    nausea, last  2 surgeries nausea much less  . Fibromyalgia 12/04/2010   [facility] in [place]; Dr. Gwenevere Ghazi; followed every six months.  . Fibromyalgia   . GERD (gastroesophageal reflux disease)   . Heart murmur    Mitral valve Prolapse  . HLD (hyperlipidemia)   . Hypothyroidism post radioactive iodine   Hyperthyroidism; now secondary hypothyroidism  . MR (mitral regurgitation)    moderate; probably MVP. echo 3/10 at [facility] cardiology with normal EF, moderate MR    Social History   Tobacco Use  . Smoking status: Former Smoker    Packs/day: 2.00    Years: 15.00    Pack years: 30.00    Types: Cigarettes    Quit date: 10/12/1994    Years since quitting: 26.3  . Smokeless tobacco: Never Used  . Tobacco comment: smoked 2ppd for 15 years; quit in 1995   Vaping Use  . Vaping Use: Never used  Substance Use Topics  . Alcohol use: Yes    Alcohol/week: 0.0 standard drinks    Comment: social, rare  . Drug use: No   Allergies  Allergen Reactions  . Celebrex [Celecoxib] Swelling and Shortness Of Breath    Swelling. Developed Facial swelling   . Atorvastatin Other (See Comments)    myalgia     Medications: Outpatient Medications Prior to Visit  Medication Sig  . clonazePAM (KLONOPIN) 0.5 MG tablet Take 0.5-1 tablets (0.25-0.5 mg total) by mouth 2 (two) times daily as  needed for anxiety.  . conjugated estrogens (PREMARIN) vaginal cream Place 1 Applicatorful vaginally daily.  Marland Kitchen levothyroxine (SYNTHROID) 112 MCG tablet TAKE 1 TABLET BY MOUTH DAILY 30 MINUTES BEFORE BREAKFAST  . losartan (COZAAR) 100 MG tablet Take 1 tablet (100 mg total) by mouth daily.  . meloxicam (MOBIC) 15 MG tablet Take 15 mg by mouth daily.   . montelukast (SINGULAIR) 10 MG tablet TAKE ONE TABLET (10 MG) BY MOUTH AT BEDTIME  . mupirocin ointment (BACTROBAN) 2 % Place 1 application into the nose 2 (two) times daily.  Marland Kitchen nystatin (MYCOSTATIN) 100000 UNIT/ML suspension Take 5 mLs (500,000 Units total) by mouth 4  (four) times daily.  Marland Kitchen omeprazole (PRILOSEC) 20 MG capsule TAKE 1 CAPSULE BY MOUTH TWICE DAILY BEFORE MEALS  . phentermine 37.5 MG capsule TAKE 1 CAPSULE BY MOUTH EVERY MORNING  . traMADol (ULTRAM) 50 MG tablet Take 50 mg by mouth every 6 (six) hours as needed.   . venlafaxine XR (EFFEXOR-XR) 150 MG 24 hr capsule TAKE 1 CAPSULE BY MOUTH DAILY WITH BREAKFAST.   No facility-administered medications prior to visit.    Review of Systems  Constitutional: Positive for fatigue. Negative for activity change, appetite change, chills, diaphoresis, fever and unexpected weight change.  Musculoskeletal: Positive for arthralgias and back pain. Negative for gait problem, joint swelling, myalgias, neck pain and neck stiffness.  Skin: Positive for color change (Redness in both feet.) and wound. Negative for pallor and rash.  Neurological: Positive for numbness (Bilateral feet.).        Objective    BP (!) 150/82 (BP Location: Left Arm, Patient Position: Sitting, Cuff Size: Large)   Pulse 80   Temp 98.4 F (36.9 C) (Oral)   Wt 214 lb (97.1 kg)   BMI 33.52 kg/m    Physical Exam Vitals reviewed.  Constitutional:      General: She is not in acute distress.    Appearance: Normal appearance. She is well-developed. She is obese. She is not ill-appearing or diaphoretic.  HENT:     Head: Normocephalic and atraumatic.  Cardiovascular:     Pulses:          Dorsalis pedis pulses are 2+ on the right side and 2+ on the left side.       Posterior tibial pulses are 2+ on the right side and 2+ on the left side.  Musculoskeletal:     Cervical back: Normal range of motion and neck supple.  Skin:    Capillary Refill: Capillary refill takes 2 to 3 seconds.  Neurological:     General: No focal deficit present.     Mental Status: She is alert. Mental status is at baseline.     Gait: Gait normal.  Psychiatric:        Mood and Affect: Mood normal.        Thought Content: Thought content normal.     No  results found for any visits on 02/18/21.  Assessment & Plan     1. Neuropathy Patient with numbness in feet bilaterally and occasional feeling of coldness without the feet being cold. Suspect neuropathy. Will refer to Neurology for further evaluation. Also checking labs for autoimmune source or RA.  - Ambulatory referral to Neurology - ANA,IFA RA Diag Pnl w/rflx Tit/Patn  2. Trigger point Flexeril refilled.  - cyclobenzaprine (FLEXERIL) 5 MG tablet; Take 1 tablet (5 mg total) by mouth 3 (three) times daily as needed for muscle spasms.  Dispense: 30 tablet; Refill: 1 - ANA,IFA  RA Diag Pnl w/rflx Tit/Patn  3. Hyperhidrosis Long standing issue. Will try Qbrexza as below. Call if not improving.  - Glycopyrronium Tosylate (QBREXZA) 2.4 % PADS; Apply 1 each topically daily.  Dispense: 30 each; Refill: 5  No follow-ups on file.      Delmer Islam, PA-C, have reviewed all documentation for this visit. The documentation on 02/18/21 for the exam, diagnosis, procedures, and orders are all accurate and complete.   Reine Just  [facility] [phone] (phone) [phone] (fax)  [facility]

## 2021-02-21 ENCOUNTER — Telehealth: Payer: Self-pay

## 2021-02-21 NOTE — Telephone Encounter (Signed)
Copied from CRM [id number]. Topic: General - Other >> Feb 21, 2021 12:11 PM Pawlus, Jane [name] wrote: Reason for CRM: Pt wanted [name] call back to go over her lab results which were taken on 02/18/21. Please advise.

## 2021-02-22 LAB — ANA,IFA RA DIAG PNL W/RFLX TIT/PATN
ANA Titer 1: NEGATIVE
Cyclic Citrullin Peptide Ab: 11 units (ref 0–19)
Rheumatoid fact SerPl-aCnc: <10 IU/mL (ref <14.0)

## 2021-02-24 ENCOUNTER — Telehealth: Payer: Self-pay

## 2021-02-24 NOTE — Telephone Encounter (Signed)
Please route to result notes. 

## 2021-02-24 NOTE — Telephone Encounter (Signed)
-----   Message from [name], [place] sent at 02/24/2021  1:03 PM EDT ----- Jane [name],  The autoimmune panel and rheumatoid factor were all negative.   Daiva Nakayama, [facility]

## 2021-02-28 DIAGNOSIS — G629 Polyneuropathy, unspecified: Secondary | ICD-10-CM | POA: Diagnosis not present

## 2021-02-28 DIAGNOSIS — E538 Deficiency of other specified B group vitamins: Secondary | ICD-10-CM | POA: Diagnosis not present

## 2021-02-28 DIAGNOSIS — R7309 Other abnormal glucose: Secondary | ICD-10-CM | POA: Diagnosis not present

## 2021-02-28 DIAGNOSIS — E559 Vitamin D deficiency, unspecified: Secondary | ICD-10-CM | POA: Diagnosis not present

## 2021-02-28 DIAGNOSIS — E519 Thiamine deficiency, unspecified: Secondary | ICD-10-CM | POA: Diagnosis not present

## 2021-02-28 DIAGNOSIS — E531 Pyridoxine deficiency: Secondary | ICD-10-CM | POA: Diagnosis not present

## 2021-02-28 DIAGNOSIS — Z114 Encounter for screening for human immunodeficiency virus [HIV]: Secondary | ICD-10-CM | POA: Diagnosis not present

## 2021-02-28 DIAGNOSIS — M545 Low back pain, unspecified: Secondary | ICD-10-CM | POA: Diagnosis not present

## 2021-03-08 ENCOUNTER — Other Ambulatory Visit: Payer: Self-pay | Admitting: Neurology

## 2021-03-08 DIAGNOSIS — M4310 Spondylolisthesis, site unspecified: Secondary | ICD-10-CM

## 2021-03-17 ENCOUNTER — Other Ambulatory Visit: Payer: Self-pay

## 2021-03-17 ENCOUNTER — Ambulatory Visit
Admission: RE | Admit: 2021-03-17 | Discharge: 2021-03-17 | Disposition: A | Discharge status: Home / Self Care | Payer: Federal, State, Local not specified - PPO | Source: Ambulatory Visit | Attending: Neurology | Admitting: Neurology

## 2021-03-17 DIAGNOSIS — M545 Low back pain, unspecified: Secondary | ICD-10-CM | POA: Diagnosis not present

## 2021-03-17 DIAGNOSIS — M4310 Spondylolisthesis, site unspecified: Secondary | ICD-10-CM | POA: Diagnosis not present

## 2021-03-29 ENCOUNTER — Other Ambulatory Visit: Payer: Self-pay | Admitting: Physical Medicine & Rehabilitation

## 2021-03-29 DIAGNOSIS — M48062 Spinal stenosis, lumbar region with neurogenic claudication: Secondary | ICD-10-CM | POA: Diagnosis not present

## 2021-03-29 DIAGNOSIS — M542 Cervicalgia: Secondary | ICD-10-CM | POA: Diagnosis not present

## 2021-03-29 DIAGNOSIS — M5412 Radiculopathy, cervical region: Secondary | ICD-10-CM | POA: Diagnosis not present

## 2021-03-29 DIAGNOSIS — M5442 Lumbago with sciatica, left side: Secondary | ICD-10-CM | POA: Diagnosis not present

## 2021-03-30 DIAGNOSIS — R200 Anesthesia of skin: Secondary | ICD-10-CM | POA: Diagnosis not present

## 2021-03-30 DIAGNOSIS — M65312 Trigger thumb, left thumb: Secondary | ICD-10-CM | POA: Diagnosis not present

## 2021-03-30 DIAGNOSIS — R202 Paresthesia of skin: Secondary | ICD-10-CM | POA: Diagnosis not present

## 2021-04-03 ENCOUNTER — Other Ambulatory Visit: Payer: Self-pay

## 2021-04-03 ENCOUNTER — Ambulatory Visit
Admission: RE | Admit: 2021-04-03 | Discharge: 2021-04-03 | Disposition: A | Discharge status: Home / Self Care | Payer: Federal, State, Local not specified - PPO | Source: Ambulatory Visit | Attending: Physical Medicine & Rehabilitation | Admitting: Physical Medicine & Rehabilitation

## 2021-04-03 DIAGNOSIS — M542 Cervicalgia: Secondary | ICD-10-CM | POA: Insufficient documentation

## 2021-04-04 DIAGNOSIS — M5441 Lumbago with sciatica, right side: Secondary | ICD-10-CM | POA: Diagnosis not present

## 2021-04-04 DIAGNOSIS — G8929 Other chronic pain: Secondary | ICD-10-CM | POA: Diagnosis not present

## 2021-04-04 DIAGNOSIS — M5442 Lumbago with sciatica, left side: Secondary | ICD-10-CM | POA: Diagnosis not present

## 2021-04-04 DIAGNOSIS — M48062 Spinal stenosis, lumbar region with neurogenic claudication: Secondary | ICD-10-CM | POA: Diagnosis not present

## 2021-04-08 ENCOUNTER — Ambulatory Visit: Payer: Federal, State, Local not specified - PPO

## 2021-04-19 DIAGNOSIS — M5442 Lumbago with sciatica, left side: Secondary | ICD-10-CM | POA: Diagnosis not present

## 2021-04-19 DIAGNOSIS — M5412 Radiculopathy, cervical region: Secondary | ICD-10-CM | POA: Diagnosis not present

## 2021-04-19 DIAGNOSIS — M542 Cervicalgia: Secondary | ICD-10-CM | POA: Diagnosis not present

## 2021-04-19 DIAGNOSIS — M48062 Spinal stenosis, lumbar region with neurogenic claudication: Secondary | ICD-10-CM | POA: Diagnosis not present

## 2021-04-25 DIAGNOSIS — E118 Type 2 diabetes mellitus with unspecified complications: Secondary | ICD-10-CM | POA: Diagnosis not present

## 2021-04-25 DIAGNOSIS — M5441 Lumbago with sciatica, right side: Secondary | ICD-10-CM | POA: Diagnosis not present

## 2021-04-25 DIAGNOSIS — G8929 Other chronic pain: Secondary | ICD-10-CM | POA: Diagnosis not present

## 2021-04-25 DIAGNOSIS — M5442 Lumbago with sciatica, left side: Secondary | ICD-10-CM | POA: Diagnosis not present

## 2021-04-25 DIAGNOSIS — M48062 Spinal stenosis, lumbar region with neurogenic claudication: Secondary | ICD-10-CM | POA: Diagnosis not present

## 2021-04-29 ENCOUNTER — Other Ambulatory Visit: Payer: Self-pay | Admitting: Physician Assistant

## 2021-04-29 DIAGNOSIS — I10 Essential (primary) hypertension: Secondary | ICD-10-CM

## 2021-05-10 ENCOUNTER — Ambulatory Visit: Payer: Self-pay | Admitting: *Deleted

## 2021-05-10 DIAGNOSIS — M5412 Radiculopathy, cervical region: Secondary | ICD-10-CM | POA: Diagnosis not present

## 2021-05-10 DIAGNOSIS — G8929 Other chronic pain: Secondary | ICD-10-CM | POA: Diagnosis not present

## 2021-05-10 DIAGNOSIS — M542 Cervicalgia: Secondary | ICD-10-CM | POA: Diagnosis not present

## 2021-05-10 DIAGNOSIS — M5442 Lumbago with sciatica, left side: Secondary | ICD-10-CM | POA: Diagnosis not present

## 2021-05-10 DIAGNOSIS — M25511 Pain in right shoulder: Secondary | ICD-10-CM | POA: Diagnosis not present

## 2021-05-10 DIAGNOSIS — M48062 Spinal stenosis, lumbar region with neurogenic claudication: Secondary | ICD-10-CM | POA: Diagnosis not present

## 2021-05-10 NOTE — Telephone Encounter (Signed)
Pt called in letting us know she ran out of losartan yesterday.   She put in a refill request on 04/29/2021 but [facility] said they have not heard back regarding the refill request. Pt used to see Joycelyn Man who is no longer with the practice.   She would like to transfer her care to [facility] if possible.  I let her know I would send a note letting them know her preference for provider and that she needs a refill on her losartan today if possible since she is out of medication.  Pt was agreeable to this plan.    I sent her request to [facility] high priority.    Reason for Disposition . [1] Prescription refill request for ESSENTIAL medicine (i.e., likelihood of harm to patient if not taken) AND [2] triager unable to refill per department policy    Former pt of [facility].    She would like to transfer to [facility] is possible.  Answer Assessment - Initial Assessment Questions 1. DRUG NAME: "What medicine do you need to have refilled?"     Losartan    2. REFILLS REMAINING: "How many refills are remaining?" (Note: The label on the medicine or pill bottle will show how many refills are remaining. If there are no refills remaining, then a renewal may be needed.)     None   She ran out yesterday. 3. EXPIRATION DATE: "What is the expiration date?" (Note: The label states when the prescription will expire, and thus can no longer be refilled.)     N/A 4. PRESCRIBING HCP: "Who prescribed it?" Reason: If prescribed by specialist, call should be referred to that group.     Joycelyn Man who is no longer with [facility]. 5. SYMPTOMS: "Do you have any symptoms?"     None   Just doesn't want to be out of her BP medication 6. PREGNANCY: "Is there any chance that you are pregnant?" "When was your last menstrual period?"     N/A  Protocols used: MEDICATION REFILL AND RENEWAL CALL-A-AH

## 2021-05-11 ENCOUNTER — Other Ambulatory Visit: Payer: Self-pay | Admitting: Family Medicine

## 2021-05-11 NOTE — Telephone Encounter (Signed)
Sent to [facility] and remind patient  she needs follow up appointment in the next 2 months.

## 2021-05-12 ENCOUNTER — Telehealth: Payer: Self-pay | Admitting: *Deleted

## 2021-05-12 NOTE — Telephone Encounter (Signed)
Lmtcb, [facility] nurse triage can advise patient as below and schedule follow up with Maurine Minister in 2 months appointments are available. KW

## 2021-05-12 NOTE — Telephone Encounter (Signed)
Patient calling to verify a refill has been sent for Losartan 100 mg tabs. Verified the prescription was sent to pharmacy yesterday.

## 2021-05-13 NOTE — Telephone Encounter (Signed)
Call to patient- she is getting her Rx today- thank you. Offered to make appointment now- she request that she call back to schedule- advised not to wait too long- schedule will fill- she states she understands.

## 2021-05-24 DIAGNOSIS — M48062 Spinal stenosis, lumbar region with neurogenic claudication: Secondary | ICD-10-CM | POA: Diagnosis not present

## 2021-05-24 DIAGNOSIS — M25511 Pain in right shoulder: Secondary | ICD-10-CM | POA: Diagnosis not present

## 2021-05-24 DIAGNOSIS — M542 Cervicalgia: Secondary | ICD-10-CM | POA: Diagnosis not present

## 2021-05-24 DIAGNOSIS — M5442 Lumbago with sciatica, left side: Secondary | ICD-10-CM | POA: Diagnosis not present

## 2021-05-30 DIAGNOSIS — M5412 Radiculopathy, cervical region: Secondary | ICD-10-CM | POA: Diagnosis not present

## 2021-05-30 DIAGNOSIS — E118 Type 2 diabetes mellitus with unspecified complications: Secondary | ICD-10-CM | POA: Diagnosis not present

## 2021-05-30 DIAGNOSIS — M542 Cervicalgia: Secondary | ICD-10-CM | POA: Diagnosis not present

## 2021-05-30 DIAGNOSIS — M9931 Osseous stenosis of neural canal of cervical region: Secondary | ICD-10-CM | POA: Diagnosis not present

## 2021-06-14 ENCOUNTER — Other Ambulatory Visit: Payer: Self-pay | Admitting: Physical Medicine & Rehabilitation

## 2021-06-14 DIAGNOSIS — M5412 Radiculopathy, cervical region: Secondary | ICD-10-CM

## 2021-06-14 DIAGNOSIS — M797 Fibromyalgia: Secondary | ICD-10-CM | POA: Diagnosis not present

## 2021-06-14 DIAGNOSIS — M542 Cervicalgia: Secondary | ICD-10-CM | POA: Diagnosis not present

## 2021-06-14 DIAGNOSIS — M5441 Lumbago with sciatica, right side: Secondary | ICD-10-CM | POA: Diagnosis not present

## 2021-06-14 DIAGNOSIS — M25511 Pain in right shoulder: Secondary | ICD-10-CM | POA: Diagnosis not present

## 2021-06-14 DIAGNOSIS — M7541 Impingement syndrome of right shoulder: Secondary | ICD-10-CM | POA: Diagnosis not present

## 2021-06-14 DIAGNOSIS — M9931 Osseous stenosis of neural canal of cervical region: Secondary | ICD-10-CM | POA: Diagnosis not present

## 2021-06-14 DIAGNOSIS — M6281 Muscle weakness (generalized): Secondary | ICD-10-CM | POA: Diagnosis not present

## 2021-06-14 DIAGNOSIS — M5442 Lumbago with sciatica, left side: Secondary | ICD-10-CM | POA: Diagnosis not present

## 2021-06-16 ENCOUNTER — Other Ambulatory Visit: Payer: Self-pay

## 2021-06-16 ENCOUNTER — Ambulatory Visit
Admission: RE | Admit: 2021-06-16 | Discharge: 2021-06-16 | Disposition: A | Discharge status: Home / Self Care | Payer: Federal, State, Local not specified - PPO | Source: Ambulatory Visit | Attending: Physical Medicine & Rehabilitation | Admitting: Physical Medicine & Rehabilitation

## 2021-06-16 DIAGNOSIS — M47812 Spondylosis without myelopathy or radiculopathy, cervical region: Secondary | ICD-10-CM | POA: Diagnosis not present

## 2021-06-16 DIAGNOSIS — M542 Cervicalgia: Secondary | ICD-10-CM

## 2021-06-16 DIAGNOSIS — M5412 Radiculopathy, cervical region: Secondary | ICD-10-CM

## 2021-06-16 MED ORDER — TRIAMCINOLONE ACETONIDE 40 MG/ML IJ SUSP (RADIOLOGY)
60.0000 mg | Freq: Once | INTRAMUSCULAR | Status: AC
  Administered 2021-06-16: 60 mg via EPIDURAL

## 2021-06-16 MED ORDER — IOPAMIDOL (ISOVUE-M 300) INJECTION 61%
1.0000 mL | Freq: Once | INTRAMUSCULAR | Status: AC | PRN
  Administered 2021-06-16: 1 mL via EPIDURAL

## 2021-06-16 NOTE — Discharge Instructions (Signed)

## 2021-06-20 ENCOUNTER — Other Ambulatory Visit: Payer: Federal, State, Local not specified - PPO

## 2021-06-22 DIAGNOSIS — M6281 Muscle weakness (generalized): Secondary | ICD-10-CM | POA: Diagnosis not present

## 2021-06-22 DIAGNOSIS — G8929 Other chronic pain: Secondary | ICD-10-CM | POA: Diagnosis not present

## 2021-06-22 DIAGNOSIS — M5441 Lumbago with sciatica, right side: Secondary | ICD-10-CM | POA: Diagnosis not present

## 2021-06-22 DIAGNOSIS — M5442 Lumbago with sciatica, left side: Secondary | ICD-10-CM | POA: Diagnosis not present

## 2021-06-29 ENCOUNTER — Other Ambulatory Visit: Payer: Self-pay | Admitting: Family Medicine

## 2021-06-29 DIAGNOSIS — R0602 Shortness of breath: Secondary | ICD-10-CM

## 2021-06-29 DIAGNOSIS — K219 Gastro-esophageal reflux disease without esophagitis: Secondary | ICD-10-CM

## 2021-06-29 NOTE — Telephone Encounter (Signed)
  Notes to clinic:  PT OUT OF REFILLS.WAS A PT [facility],HAS NOT YET HAD APPT WITH ANOTHER MD FROM PRACTICE. THANK YOU   Requested Prescriptions  Pending Prescriptions Disp Refills   montelukast (SINGULAIR) 10 MG tablet [Pharmacy Med Name: MONTELUKAST SODIUM 10 MG TAB] 90 tablet 1    Sig: TAKE 1 TABLET AT BEDTIME      Pulmonology:  Leukotriene Inhibitors Passed - 06/29/2021 11:27 AM      Passed - Valid encounter within last 12 months    Recent Outpatient Visits           4 months ago Neuropathy   [facility] [place], Alessandra Bevels, [place]   5 months ago [facility], Victorino Dike M, [place]   9 months ago Myofascial pain syndrome of thoracic spine   [facility] Joycelyn Man M, [place]   10 months ago Acute right-sided thoracic back pain   [facility] [place], Ricki Rodriguez M, [place]   1 year ago Herpes zoster lesion   [facility] [place], Jodell Cipro, [place]

## 2021-06-29 NOTE — Telephone Encounter (Signed)
   Notes to clinic:  PT OUT OF REFILLS. WAS PT [facility], HASN'T SEEN ANOTHER MD IN PRACTICE YET   Requested Prescriptions  Pending Prescriptions Disp Refills   omeprazole (PRILOSEC) 20 MG capsule [Pharmacy Med Name: OMEPRAZOLE DR 20 MG CAP] 180 capsule 1    Sig: TAKE 1 CAPSULE BY MOUTH TWICE DAILY BEFORE MEALS      Gastroenterology: Proton Pump Inhibitors Passed - 06/29/2021 11:27 AM      Passed - Valid encounter within last 12 months    Recent Outpatient Visits           4 months ago Neuropathy   [facility] [place], Alessandra Bevels, [place]   5 months ago [facility], Victorino Dike M, [place]   9 months ago Myofascial pain syndrome of thoracic spine   [facility] Joycelyn Man M, [place]   10 months ago Acute right-sided thoracic back pain   [facility] [place], Ricki Rodriguez M, [place]   1 year ago Herpes zoster lesion   [facility] [place], Jodell Cipro, [place]

## 2021-06-30 DIAGNOSIS — M5442 Lumbago with sciatica, left side: Secondary | ICD-10-CM | POA: Diagnosis not present

## 2021-06-30 DIAGNOSIS — M47812 Spondylosis without myelopathy or radiculopathy, cervical region: Secondary | ICD-10-CM | POA: Diagnosis not present

## 2021-06-30 DIAGNOSIS — M6281 Muscle weakness (generalized): Secondary | ICD-10-CM | POA: Diagnosis not present

## 2021-06-30 DIAGNOSIS — M5441 Lumbago with sciatica, right side: Secondary | ICD-10-CM | POA: Diagnosis not present

## 2021-07-01 DIAGNOSIS — M7581 Other shoulder lesions, right shoulder: Secondary | ICD-10-CM | POA: Diagnosis not present

## 2021-07-06 DIAGNOSIS — M5442 Lumbago with sciatica, left side: Secondary | ICD-10-CM | POA: Diagnosis not present

## 2021-07-06 DIAGNOSIS — M5441 Lumbago with sciatica, right side: Secondary | ICD-10-CM | POA: Diagnosis not present

## 2021-07-06 DIAGNOSIS — M47812 Spondylosis without myelopathy or radiculopathy, cervical region: Secondary | ICD-10-CM | POA: Diagnosis not present

## 2021-07-06 DIAGNOSIS — M6281 Muscle weakness (generalized): Secondary | ICD-10-CM | POA: Diagnosis not present

## 2021-07-11 ENCOUNTER — Other Ambulatory Visit: Payer: Self-pay | Admitting: Family Medicine

## 2021-07-11 ENCOUNTER — Other Ambulatory Visit: Payer: Self-pay | Admitting: Physician Assistant

## 2021-07-11 DIAGNOSIS — M797 Fibromyalgia: Secondary | ICD-10-CM

## 2021-07-11 DIAGNOSIS — F411 Generalized anxiety disorder: Secondary | ICD-10-CM

## 2021-07-20 ENCOUNTER — Telehealth: Payer: Self-pay

## 2021-07-20 NOTE — Telephone Encounter (Signed)
Copied from CRM [id number]. Topic: General - Other >> Jul 20, 2021 11:02 AM Wyonia Hough E wrote: Reason for CRM:Pt would like get some blood work done and also asked if she can see Robynn Pane Payne/ Pt has BC[employer]/ please advise asap  Pt has been referred to other specialist and they think she as an A1C issue

## 2021-07-20 NOTE — Telephone Encounter (Signed)
Pt advised and scheduled with Maurine Minister.

## 2021-07-20 NOTE — Telephone Encounter (Signed)
Jane [name] does not accept BCBS at this time. KW

## 2021-07-25 ENCOUNTER — Ambulatory Visit: Payer: Federal, State, Local not specified - PPO | Admitting: Family Medicine

## 2021-07-25 DIAGNOSIS — M159 Polyosteoarthritis, unspecified: Secondary | ICD-10-CM | POA: Diagnosis not present

## 2021-07-25 DIAGNOSIS — M797 Fibromyalgia: Secondary | ICD-10-CM | POA: Diagnosis not present

## 2021-07-25 DIAGNOSIS — M539 Dorsopathy, unspecified: Secondary | ICD-10-CM | POA: Diagnosis not present

## 2021-07-28 DIAGNOSIS — M4306 Spondylolysis, lumbar region: Secondary | ICD-10-CM | POA: Diagnosis not present

## 2021-07-28 DIAGNOSIS — M48062 Spinal stenosis, lumbar region with neurogenic claudication: Secondary | ICD-10-CM | POA: Diagnosis not present

## 2021-07-28 DIAGNOSIS — M4310 Spondylolisthesis, site unspecified: Secondary | ICD-10-CM | POA: Diagnosis not present

## 2021-07-28 DIAGNOSIS — M542 Cervicalgia: Secondary | ICD-10-CM | POA: Diagnosis not present

## 2021-08-02 DIAGNOSIS — M5442 Lumbago with sciatica, left side: Secondary | ICD-10-CM | POA: Diagnosis not present

## 2021-08-02 DIAGNOSIS — M6281 Muscle weakness (generalized): Secondary | ICD-10-CM | POA: Diagnosis not present

## 2021-08-02 DIAGNOSIS — G8929 Other chronic pain: Secondary | ICD-10-CM | POA: Diagnosis not present

## 2021-08-02 DIAGNOSIS — M5441 Lumbago with sciatica, right side: Secondary | ICD-10-CM | POA: Diagnosis not present

## 2021-08-11 DIAGNOSIS — M6281 Muscle weakness (generalized): Secondary | ICD-10-CM | POA: Diagnosis not present

## 2021-08-16 DIAGNOSIS — L74519 Primary focal hyperhidrosis, unspecified: Secondary | ICD-10-CM | POA: Diagnosis not present

## 2021-08-16 DIAGNOSIS — L74510 Primary focal hyperhidrosis, axilla: Secondary | ICD-10-CM | POA: Diagnosis not present

## 2021-08-25 DIAGNOSIS — M6281 Muscle weakness (generalized): Secondary | ICD-10-CM | POA: Diagnosis not present

## 2021-08-25 DIAGNOSIS — M47812 Spondylosis without myelopathy or radiculopathy, cervical region: Secondary | ICD-10-CM | POA: Diagnosis not present

## 2021-08-25 DIAGNOSIS — M5441 Lumbago with sciatica, right side: Secondary | ICD-10-CM | POA: Diagnosis not present

## 2021-08-25 DIAGNOSIS — M5442 Lumbago with sciatica, left side: Secondary | ICD-10-CM | POA: Diagnosis not present

## 2021-08-31 DIAGNOSIS — M6281 Muscle weakness (generalized): Secondary | ICD-10-CM | POA: Diagnosis not present

## 2021-09-05 IMAGING — MG MM DIGITAL SCREENING BILAT W/ TOMO AND CAD
8 series · 8 of 24 positions shown · non-contrast
Comparison: Previous exam(s).

CLINICAL DATA: Screening.

EXAM:
DIGITAL SCREENING BILATERAL MAMMOGRAM WITH TOMOSYNTHESIS AND CAD
TECHNIQUE: Bilateral screening digital craniocaudal and mediolateral oblique
mammograms were obtained. Bilateral screening digital breast
tomosynthesis was performed. The images were evaluated with
computer-aided detection.

[R MLO synth-2D]
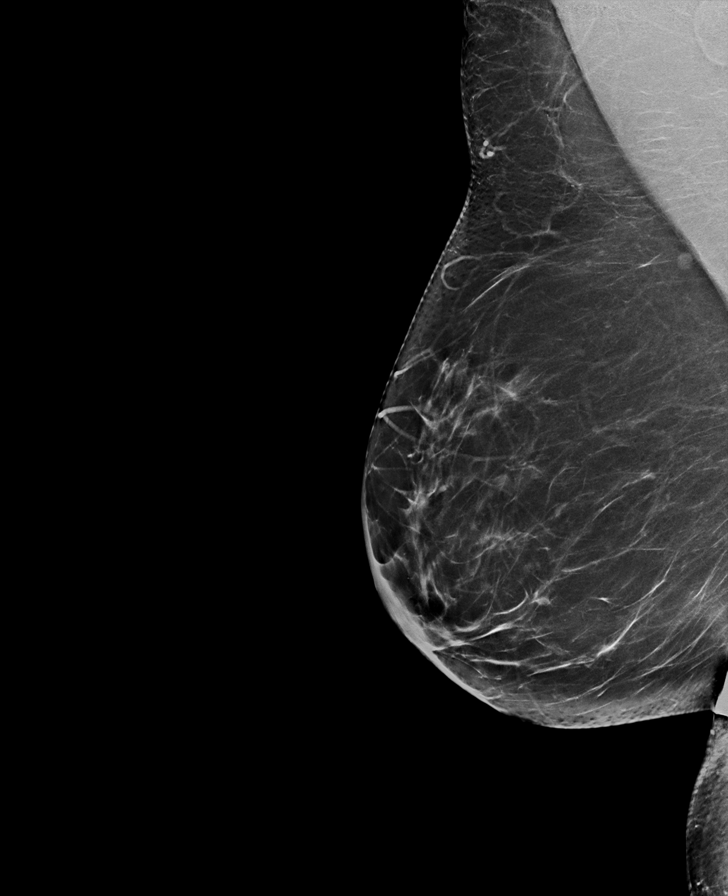

[L CC synth-2D]
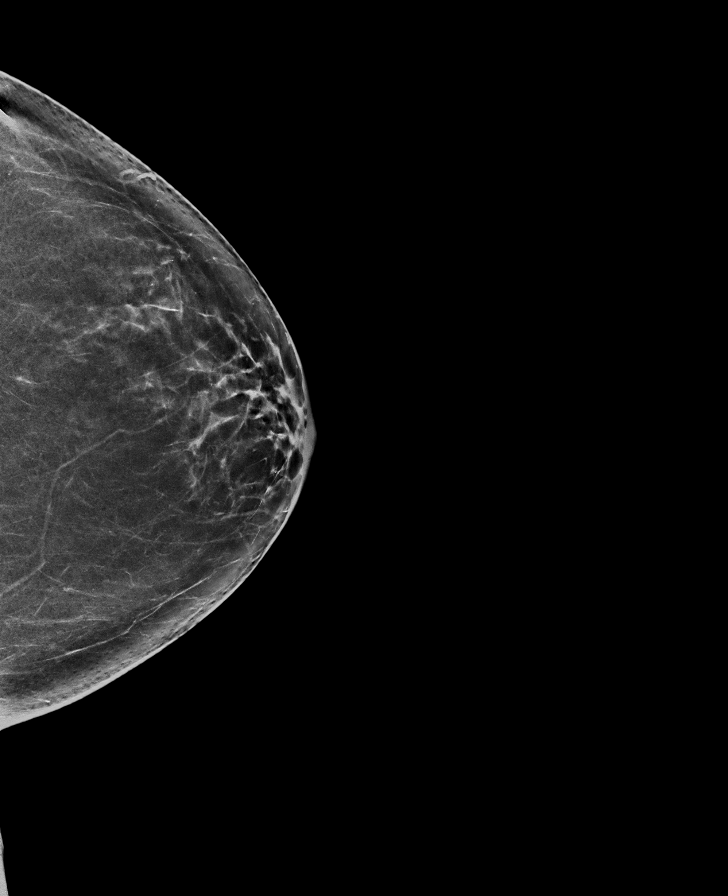

[L MLO synth-2D]
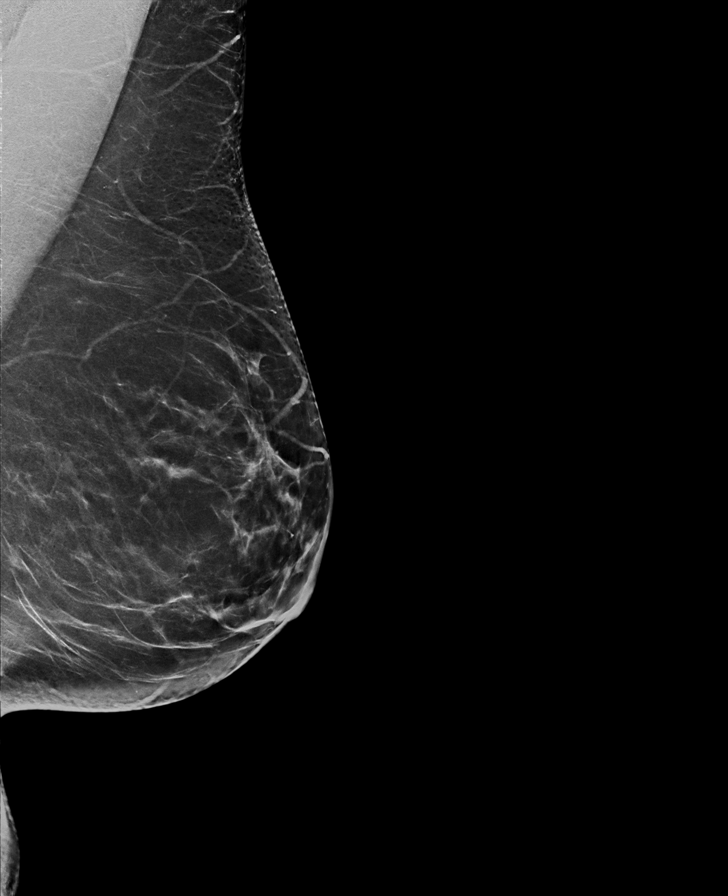

[R CC synth-2D]
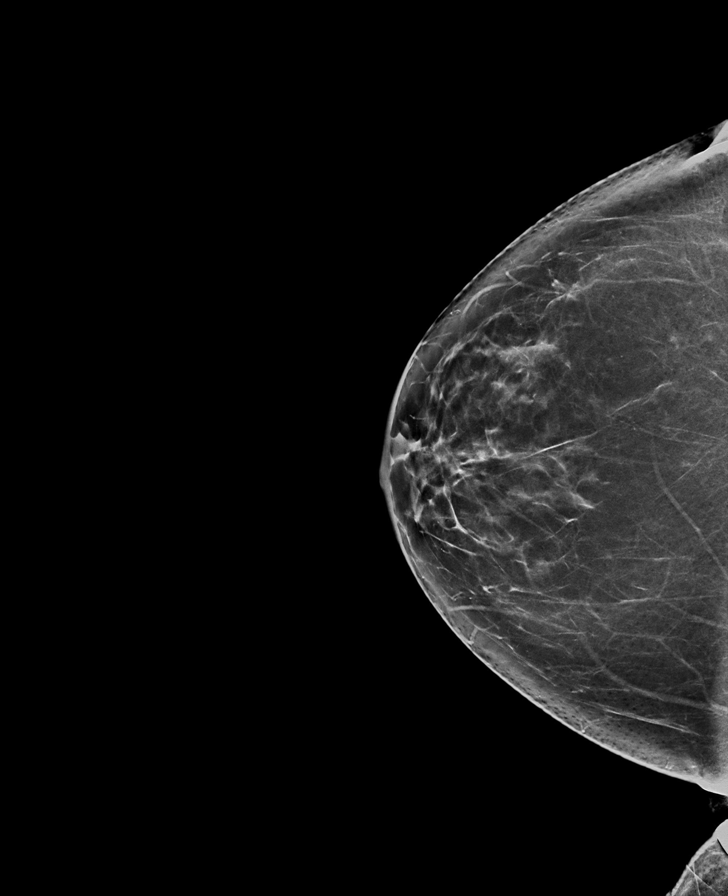

[R CC tomo · tomo slice 39/77.0]
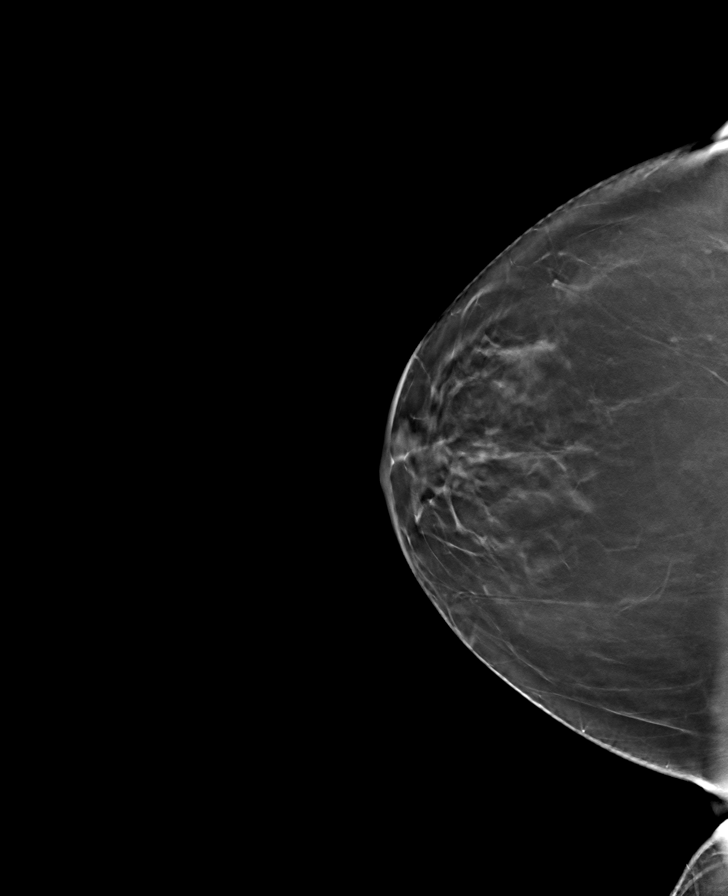

[L MLO tomo · tomo slice 41/82.0]
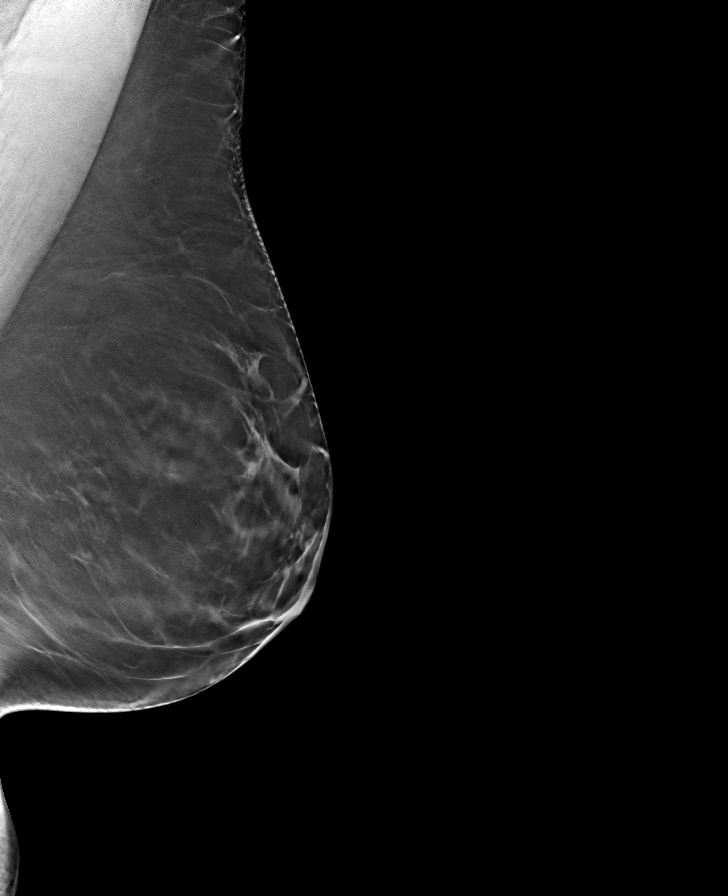

[L CC tomo · tomo slice 38/75.0]
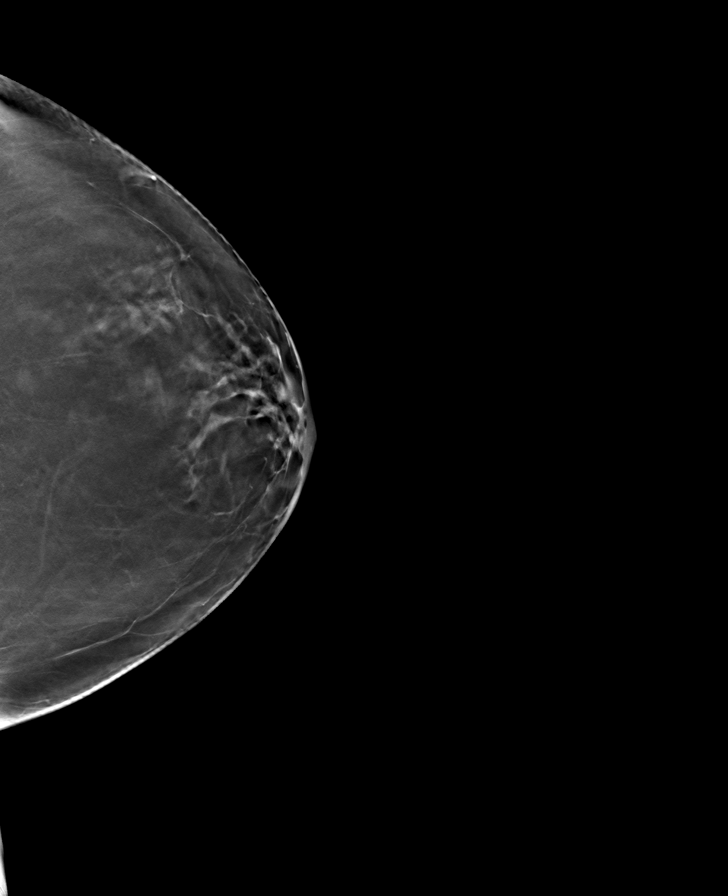

[R MLO tomo · tomo slice 42/83.0]
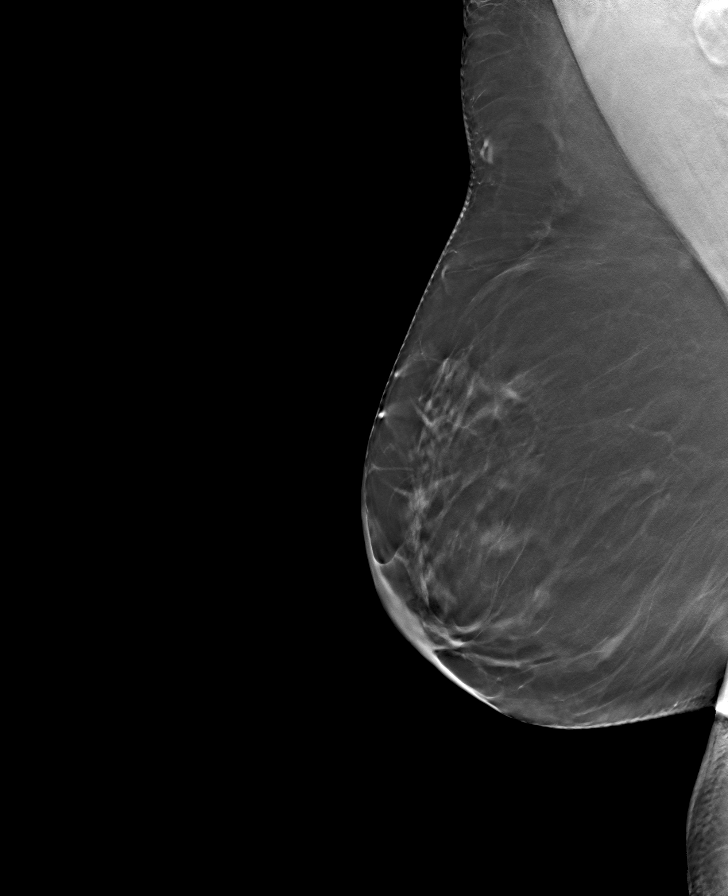

[8 of 24 positions shown; findings below may reference images not displayed]

ACR Breast Density Category b: There are scattered areas of
fibroglandular density.
FINDINGS: There are no findings suspicious for malignancy. The images were
evaluated with computer-aided detection.
IMPRESSION: No mammographic evidence of malignancy. A result letter of this
screening mammogram will be mailed directly to the patient.

RECOMMENDATION:
Screening mammogram in one year. (Code:WJ-[id number])

BI-RADS CATEGORY  1: Negative.

## 2021-09-15 DIAGNOSIS — M48062 Spinal stenosis, lumbar region with neurogenic claudication: Secondary | ICD-10-CM | POA: Diagnosis not present

## 2021-09-15 DIAGNOSIS — E2839 Other primary ovarian failure: Secondary | ICD-10-CM | POA: Diagnosis not present

## 2021-09-15 DIAGNOSIS — M4310 Spondylolisthesis, site unspecified: Secondary | ICD-10-CM | POA: Diagnosis not present

## 2021-09-15 DIAGNOSIS — M4306 Spondylolysis, lumbar region: Secondary | ICD-10-CM | POA: Diagnosis not present

## 2021-09-20 ENCOUNTER — Other Ambulatory Visit: Payer: Self-pay | Admitting: Neurosurgery

## 2021-09-22 ENCOUNTER — Ambulatory Visit
Payer: Federal, State, Local not specified - PPO | Admitting: Student in an Organized Health Care Education/Training Program

## 2021-09-27 ENCOUNTER — Other Ambulatory Visit: Payer: Self-pay

## 2021-09-27 ENCOUNTER — Encounter
Admission: RE | Admit: 2021-09-27 | Discharge: 2021-09-27 | Disposition: A | Discharge status: Home / Self Care | Payer: Federal, State, Local not specified - PPO | Source: Ambulatory Visit | Attending: Neurosurgery | Admitting: Neurosurgery

## 2021-09-27 DIAGNOSIS — Z01818 Encounter for other preprocedural examination: Secondary | ICD-10-CM | POA: Insufficient documentation

## 2021-09-27 DIAGNOSIS — I10 Essential (primary) hypertension: Secondary | ICD-10-CM | POA: Insufficient documentation

## 2021-09-27 DIAGNOSIS — Z01810 Encounter for preprocedural cardiovascular examination: Secondary | ICD-10-CM | POA: Diagnosis not present

## 2021-09-27 HISTORY — DX: Nausea with vomiting, unspecified: R11.2

## 2021-09-27 HISTORY — DX: Other specified postprocedural states: Z98.890

## 2021-09-27 HISTORY — DX: Prediabetes: R73.03

## 2021-09-27 HISTORY — DX: Anxiety disorder, unspecified: F41.9

## 2021-09-27 HISTORY — DX: Headache, unspecified: R51.9

## 2021-09-27 HISTORY — DX: Essential (primary) hypertension: I10

## 2021-09-27 HISTORY — DX: Family history of other specified conditions: Z84.89

## 2021-09-27 LAB — TYPE AND SCREEN
ABO/RH(D): O POS
Antibody Screen: NEGATIVE

## 2021-09-27 LAB — URINALYSIS, ROUTINE W REFLEX MICROSCOPIC
Bacteria, UA: NONE SEEN
Bilirubin Urine: NEGATIVE
Glucose, UA: NEGATIVE mg/dL
Ketones, ur: NEGATIVE mg/dL
Leukocytes,Ua: NEGATIVE
Nitrite: NEGATIVE
Protein, ur: NEGATIVE mg/dL
Specific Gravity, Urine: 1.01 (ref 1.005–1.030)
pH: 6 (ref 5.0–8.0)

## 2021-09-27 LAB — BASIC METABOLIC PANEL
Anion gap: 8 (ref 5–15)
BUN: 14 mg/dL (ref 8–23)
CO2: 29 mmol/L (ref 22–32)
Calcium: 9.2 mg/dL (ref 8.9–10.3)
Chloride: 104 mmol/L (ref 98–111)
Creatinine, Ser: 0.58 mg/dL (ref 0.44–1.00)
GFR, Estimated: >60 mL/min (ref >60)
Glucose, Bld: 97 mg/dL (ref 70–99)
Potassium: 3.7 mmol/L (ref 3.5–5.1)
Sodium: 141 mmol/L (ref 135–145)

## 2021-09-27 LAB — CBC
HCT: 40.5 % (ref 36.0–46.0)
Hemoglobin: 13.7 g/dL (ref 12.0–15.0)
MCH: 29.8 pg (ref 26.0–34.0)
MCHC: 33.8 g/dL (ref 30.0–36.0)
MCV: 88.2 fL (ref 80.0–100.0)
Platelets: 274 10*3/uL (ref 150–400)
RBC: 4.59 MIL/uL (ref 3.87–5.11)
RDW: 13.1 % (ref 11.5–15.5)
WBC: 5.8 10*3/uL (ref 4.0–10.5)
nRBC: 0 % (ref 0.0–0.2)

## 2021-09-27 LAB — APTT: aPTT: 34 seconds (ref 24–36)

## 2021-09-27 LAB — SURGICAL PCR SCREEN
MRSA, PCR: NEGATIVE
Staphylococcus aureus: NEGATIVE

## 2021-09-27 LAB — PROTIME-INR
INR: 1 (ref 0.8–1.2)
Prothrombin Time: 13.2 seconds (ref 11.4–15.2)

## 2021-09-27 NOTE — Patient Instructions (Addendum)
Your procedure is scheduled on: Wednesday 10/05/21 Report to the Registration Desk on the 1st floor of the [facility]. To find out your arrival time, please call [phone] between 1PM - 3PM on: Tuesday 10/04/21  REMEMBER: Instructions that are not followed completely may result in serious medical risk, up to and including death; or upon the discretion of your surgeon and anesthesiologist your surgery may need to be rescheduled.  Do not eat food after midnight the night before surgery.  No gum chewing, lozengers or hard candies.  You may however, drink CLEAR liquids up to 2 hours before you are scheduled to arrive for your surgery. Do not drink anything within 2 hours of your scheduled arrival time.  Clear liquids include: - water  - apple juice without pulp - gatorade (not RED, PURPLE, OR BLUE) - black coffee or tea (Do NOT add milk or creamers to the coffee or tea) Do NOT drink anything that is not on this list.  TAKE THESE MEDICATIONS THE MORNING OF SURGERY WITH A SIP OF WATER: clonazePAM only if needed gabapentin (NEURONTIN levothyroxine (SYNTHROID venlafaxine XR (EFFEXOR-XR  omeprazole (PRILOSEC) (take one the night before and one on the morning of surgery - helps to prevent nausea after surgery.)  One week prior to surgery: Stop Anti-inflammatories (NSAIDS) such as Advil, Aleve, Ibuprofen, Motrin, Naproxen, Naprosyn and Aspirin based products such as Excedrin, Goodys Powder, BC Powder. Stop ANY OVER THE COUNTER supplements until after surgery. You may however, continue to take Tylenol if needed for pain up until the day of surgery.  No Alcohol for 24 hours before or after surgery.  No Smoking including e-cigarettes for 24 hours prior to surgery.  No chewable tobacco products for at least 6 hours prior to surgery.  No nicotine patches on the day of surgery.  Do not use any "recreational" drugs for at least a week prior to your surgery.  Please be advised that the  combination of cocaine and anesthesia may have negative outcomes, up to and including death. If you test positive for cocaine, your surgery will be cancelled.  On the morning of surgery brush your teeth with toothpaste and water, you may rinse your mouth with mouthwash if you wish. Do not swallow any toothpaste or mouthwash.  Use CHG Soap or wipes as directed on instruction sheet.  Do not wear jewelry, make-up, hairpins, clips or nail polish.  Do not wear lotions, powders, or perfumes.   Do not shave body from the neck down 48 hours prior to surgery just in case you cut yourself which could leave a site for infection.  Also, freshly shaved skin may become irritated if using the CHG soap.  Do not bring valuables to the hospital. [facility] is not responsible for any missing/lost belongings or valuables.   Notify your doctor if there is any change in your medical condition (cold, fever, infection).  Wear comfortable clothing (specific to your surgery type) to the hospital.  After surgery, you can help prevent lung complications by doing breathing exercises.  Take deep breaths and cough every 1-2 hours. Your doctor may order a device called an Incentive Spirometer to help you take deep breaths. When coughing or sneezing, hold a pillow firmly against your incision with both hands. This is called "splinting." Doing this helps protect your incision. It also decreases belly discomfort.  If you are being admitted to the hospital overnight, leave your suitcase in the car. After surgery it may be brought to your room.  If you are taking public transportation, you will need to have a responsible adult (18 years or older) with you. Please confirm with your physician that it is acceptable to use public transportation.   Please call the Pre-admissions Testing Dept. at [phone] if you have any questions about these instructions.  Surgery Visitation Policy:  Patients undergoing a surgery or  procedure may have one family member or support person with them as long as that person is not COVID-19 positive or experiencing its symptoms.  That person may remain in the waiting area during the procedure and may rotate out with other people.  Inpatient Visitation:    Visiting hours are 7 a.m. to 8 p.m. Up to two visitors ages 16+ are allowed at one time in a patient room. The visitors may rotate out with other people during the day. Visitors must check out when they leave, or other visitors will not be allowed. One designated support person may remain overnight. The visitor must pass COVID-19 screenings, use hand sanitizer when entering and exiting the patient's room and wear a mask at all times, including in the patient's room. Patients must also wear a mask when staff or their visitor are in the room. Masking is required regardless of vaccination status.

## 2021-09-30 DIAGNOSIS — M48062 Spinal stenosis, lumbar region with neurogenic claudication: Secondary | ICD-10-CM | POA: Diagnosis not present

## 2021-09-30 DIAGNOSIS — M4306 Spondylolysis, lumbar region: Secondary | ICD-10-CM | POA: Diagnosis not present

## 2021-10-03 ENCOUNTER — Other Ambulatory Visit: Payer: Self-pay

## 2021-10-03 ENCOUNTER — Other Ambulatory Visit
Admission: RE | Admit: 2021-10-03 | Discharge: 2021-10-03 | Disposition: A | Discharge status: Home / Self Care | Payer: Federal, State, Local not specified - PPO | Source: Ambulatory Visit | Attending: Neurosurgery | Admitting: Neurosurgery

## 2021-10-03 DIAGNOSIS — Z79899 Other long term (current) drug therapy: Secondary | ICD-10-CM | POA: Diagnosis not present

## 2021-10-03 DIAGNOSIS — E669 Obesity, unspecified: Secondary | ICD-10-CM | POA: Diagnosis not present

## 2021-10-03 DIAGNOSIS — Z01812 Encounter for preprocedural laboratory examination: Secondary | ICD-10-CM | POA: Insufficient documentation

## 2021-10-03 DIAGNOSIS — I959 Hypotension, unspecified: Secondary | ICD-10-CM | POA: Diagnosis not present

## 2021-10-03 DIAGNOSIS — I10 Essential (primary) hypertension: Secondary | ICD-10-CM | POA: Diagnosis not present

## 2021-10-03 DIAGNOSIS — Z20822 Contact with and (suspected) exposure to covid-19: Secondary | ICD-10-CM

## 2021-10-03 DIAGNOSIS — Z87891 Personal history of nicotine dependence: Secondary | ICD-10-CM | POA: Diagnosis not present

## 2021-10-03 DIAGNOSIS — M4316 Spondylolisthesis, lumbar region: Secondary | ICD-10-CM | POA: Diagnosis not present

## 2021-10-03 DIAGNOSIS — M5416 Radiculopathy, lumbar region: Secondary | ICD-10-CM | POA: Diagnosis not present

## 2021-10-03 DIAGNOSIS — J45909 Unspecified asthma, uncomplicated: Secondary | ICD-10-CM | POA: Diagnosis not present

## 2021-10-03 DIAGNOSIS — Z13820 Encounter for screening for osteoporosis: Secondary | ICD-10-CM | POA: Diagnosis not present

## 2021-10-03 DIAGNOSIS — K219 Gastro-esophageal reflux disease without esophagitis: Secondary | ICD-10-CM | POA: Diagnosis not present

## 2021-10-03 DIAGNOSIS — Z96653 Presence of artificial knee joint, bilateral: Secondary | ICD-10-CM | POA: Diagnosis not present

## 2021-10-03 DIAGNOSIS — Z6836 Body mass index (BMI) 36.0-36.9, adult: Secondary | ICD-10-CM | POA: Diagnosis not present

## 2021-10-03 DIAGNOSIS — E039 Hypothyroidism, unspecified: Secondary | ICD-10-CM | POA: Diagnosis not present

## 2021-10-03 DIAGNOSIS — M48062 Spinal stenosis, lumbar region with neurogenic claudication: Secondary | ICD-10-CM | POA: Diagnosis not present

## 2021-10-03 DIAGNOSIS — E2839 Other primary ovarian failure: Secondary | ICD-10-CM | POA: Diagnosis not present

## 2021-10-04 LAB — SARS CORONAVIRUS 2 (TAT 6-24 HRS): SARS Coronavirus 2: NEGATIVE

## 2021-10-05 ENCOUNTER — Inpatient Hospital Stay
Admission: RE | Admit: 2021-10-05 | Discharge: 2021-10-09 | DRG: 455 | Disposition: A | Discharge status: Home / Self Care | Payer: Federal, State, Local not specified - PPO | Attending: Neurosurgery | Admitting: Neurosurgery

## 2021-10-05 ENCOUNTER — Other Ambulatory Visit: Payer: Self-pay

## 2021-10-05 ENCOUNTER — Inpatient Hospital Stay: Payer: Federal, State, Local not specified - PPO | Admitting: Anesthesiology

## 2021-10-05 ENCOUNTER — Encounter
Admission: RE | Disposition: A | Discharge status: Home / Self Care | Payer: Self-pay | Source: Home / Self Care | Attending: Neurosurgery

## 2021-10-05 ENCOUNTER — Inpatient Hospital Stay: Payer: Federal, State, Local not specified - PPO | Admitting: Urgent Care

## 2021-10-05 ENCOUNTER — Encounter: Payer: Self-pay | Admitting: Neurosurgery

## 2021-10-05 ENCOUNTER — Inpatient Hospital Stay: Payer: Federal, State, Local not specified - PPO

## 2021-10-05 DIAGNOSIS — K219 Gastro-esophageal reflux disease without esophagitis: Secondary | ICD-10-CM | POA: Diagnosis present

## 2021-10-05 DIAGNOSIS — E039 Hypothyroidism, unspecified: Secondary | ICD-10-CM | POA: Diagnosis present

## 2021-10-05 DIAGNOSIS — J45909 Unspecified asthma, uncomplicated: Secondary | ICD-10-CM | POA: Diagnosis present

## 2021-10-05 DIAGNOSIS — Z20822 Contact with and (suspected) exposure to covid-19: Secondary | ICD-10-CM | POA: Diagnosis present

## 2021-10-05 DIAGNOSIS — Z87891 Personal history of nicotine dependence: Secondary | ICD-10-CM | POA: Diagnosis not present

## 2021-10-05 DIAGNOSIS — M4306 Spondylolysis, lumbar region: Secondary | ICD-10-CM | POA: Diagnosis not present

## 2021-10-05 DIAGNOSIS — Z981 Arthrodesis status: Secondary | ICD-10-CM

## 2021-10-05 DIAGNOSIS — Z96653 Presence of artificial knee joint, bilateral: Secondary | ICD-10-CM | POA: Diagnosis present

## 2021-10-05 DIAGNOSIS — I959 Hypotension, unspecified: Secondary | ICD-10-CM | POA: Diagnosis not present

## 2021-10-05 DIAGNOSIS — M4316 Spondylolisthesis, lumbar region: Secondary | ICD-10-CM | POA: Diagnosis not present

## 2021-10-05 DIAGNOSIS — K6289 Other specified diseases of anus and rectum: Secondary | ICD-10-CM | POA: Diagnosis not present

## 2021-10-05 DIAGNOSIS — M791 Myalgia, unspecified site: Secondary | ICD-10-CM

## 2021-10-05 DIAGNOSIS — Z6836 Body mass index (BMI) 36.0-36.9, adult: Secondary | ICD-10-CM | POA: Diagnosis not present

## 2021-10-05 DIAGNOSIS — I10 Essential (primary) hypertension: Secondary | ICD-10-CM | POA: Diagnosis present

## 2021-10-05 DIAGNOSIS — Z79899 Other long term (current) drug therapy: Secondary | ICD-10-CM | POA: Diagnosis not present

## 2021-10-05 DIAGNOSIS — M5416 Radiculopathy, lumbar region: Secondary | ICD-10-CM | POA: Diagnosis present

## 2021-10-05 DIAGNOSIS — M48062 Spinal stenosis, lumbar region with neurogenic claudication: Secondary | ICD-10-CM | POA: Diagnosis not present

## 2021-10-05 DIAGNOSIS — M4310 Spondylolisthesis, site unspecified: Secondary | ICD-10-CM | POA: Diagnosis not present

## 2021-10-05 DIAGNOSIS — E669 Obesity, unspecified: Secondary | ICD-10-CM | POA: Diagnosis present

## 2021-10-05 DIAGNOSIS — M4326 Fusion of spine, lumbar region: Secondary | ICD-10-CM | POA: Diagnosis not present

## 2021-10-05 DIAGNOSIS — Z419 Encounter for procedure for purposes other than remedying health state, unspecified: Secondary | ICD-10-CM

## 2021-10-05 HISTORY — PX: LUMBAR LAMINECTOMY/DECOMPRESSION MICRODISCECTOMY: SHX5026

## 2021-10-05 HISTORY — PX: MAXIMUM ACCESS (MAS) TRANSFORAMINAL LUMBAR INTERBODY FUSION (TLIF) 1 LEVEL: SHX6392

## 2021-10-05 HISTORY — PX: APPLICATION OF WOUND VAC: SHX5189

## 2021-10-05 SURGERY — LUMBAR LAMINECTOMY/DECOMPRESSION MICRODISCECTOMY 2 LEVELS
Anesthesia: General | Site: Back

## 2021-10-05 MED ORDER — SENNA 8.6 MG PO TABS
1.0000 | ORAL_TABLET | Freq: Two times a day (BID) | ORAL | Status: DC
  Administered 2021-10-05 – 2021-10-09 (×8): 8.6 mg via ORAL
  Filled 2021-10-05 (×8): qty 1

## 2021-10-05 MED ORDER — PHENYLEPHRINE HCL-NACL 20-0.9 MG/250ML-% IV SOLN
INTRAVENOUS | Status: DC | PRN
  Administered 2021-10-05: 30 ug/min via INTRAVENOUS

## 2021-10-05 MED ORDER — SUCCINYLCHOLINE CHLORIDE 200 MG/10ML IV SOSY
PREFILLED_SYRINGE | INTRAVENOUS | Status: DC | PRN
  Administered 2021-10-05: 100 mg via INTRAVENOUS

## 2021-10-05 MED ORDER — DEXAMETHASONE SODIUM PHOSPHATE 10 MG/ML IJ SOLN
INTRAMUSCULAR | Status: DC | PRN
  Administered 2021-10-05: 10 mg via INTRAVENOUS

## 2021-10-05 MED ORDER — ONDANSETRON HCL 4 MG PO TABS: 4.0000 mg | ORAL_TABLET | Freq: Four times a day (QID) | ORAL | Status: DC | PRN

## 2021-10-05 MED ORDER — SODIUM CHLORIDE FLUSH 0.9 % IV SOLN
INTRAVENOUS | Status: AC
  Filled 2021-10-05: qty 10

## 2021-10-05 MED ORDER — VANCOMYCIN HCL 1500 MG/300ML IV SOLN
1500.0000 mg | Freq: Once | INTRAVENOUS | Status: AC
  Administered 2021-10-05: 1500 mg via INTRAVENOUS
  Filled 2021-10-05: qty 300

## 2021-10-05 MED ORDER — OXYCODONE HCL 5 MG/5ML PO SOLN: 5.0000 mg | Freq: Once | ORAL | Status: DC | PRN

## 2021-10-05 MED ORDER — GABAPENTIN 300 MG PO CAPS: 300.0000 mg | ORAL_CAPSULE | ORAL | Status: DC

## 2021-10-05 MED ORDER — REMIFENTANIL HCL 1 MG IV SOLR
INTRAVENOUS | Status: DC | PRN
  Administered 2021-10-05: .1 ug/kg/min via INTRAVENOUS

## 2021-10-05 MED ORDER — HYDROMORPHONE HCL 1 MG/ML IJ SOLN
INTRAMUSCULAR | Status: AC
  Filled 2021-10-05: qty 1

## 2021-10-05 MED ORDER — MORPHINE SULFATE (PF) 2 MG/ML IV SOLN
2.0000 mg | INTRAVENOUS | Status: DC | PRN
  Administered 2021-10-05: 2 mg via INTRAVENOUS
  Filled 2021-10-05: qty 1

## 2021-10-05 MED ORDER — HEPARIN SODIUM (PORCINE) 10000 UNIT/ML IJ SOLN
INTRAMUSCULAR | Status: DC | PRN
  Administered 2021-10-05: 10000 [IU]

## 2021-10-05 MED ORDER — 0.9 % SODIUM CHLORIDE (POUR BTL) OPTIME
TOPICAL | Status: DC | PRN
  Administered 2021-10-05 (×2): 1000 mL

## 2021-10-05 MED ORDER — ONDANSETRON HCL 4 MG/2ML IJ SOLN
INTRAMUSCULAR | Status: DC | PRN
  Administered 2021-10-05 (×2): 4 mg via INTRAVENOUS

## 2021-10-05 MED ORDER — OXYCODONE HCL 5 MG PO TABS
5.0000 mg | ORAL_TABLET | ORAL | Status: DC | PRN
  Administered 2021-10-05 – 2021-10-09 (×2): 5 mg via ORAL
  Filled 2021-10-05 (×2): qty 1

## 2021-10-05 MED ORDER — FENTANYL CITRATE (PF) 100 MCG/2ML IJ SOLN
INTRAMUSCULAR | Status: AC
  Filled 2021-10-05: qty 2

## 2021-10-05 MED ORDER — ACETAMINOPHEN 10 MG/ML IV SOLN
INTRAVENOUS | Status: AC
  Filled 2021-10-05: qty 100

## 2021-10-05 MED ORDER — SEVOFLURANE IN SOLN
RESPIRATORY_TRACT | Status: AC
  Filled 2021-10-05: qty 250

## 2021-10-05 MED ORDER — FENTANYL CITRATE (PF) 100 MCG/2ML IJ SOLN
INTRAMUSCULAR | Status: DC | PRN
  Administered 2021-10-05 (×4): 50 ug via INTRAVENOUS

## 2021-10-05 MED ORDER — FENTANYL CITRATE (PF) 100 MCG/2ML IJ SOLN
INTRAMUSCULAR | Status: AC
  Administered 2021-10-05: 25 ug via INTRAVENOUS
  Filled 2021-10-05: qty 2

## 2021-10-05 MED ORDER — LIDOCAINE HCL (CARDIAC) PF 100 MG/5ML IV SOSY
PREFILLED_SYRINGE | INTRAVENOUS | Status: DC | PRN
  Administered 2021-10-05: 100 mg via INTRAVENOUS

## 2021-10-05 MED ORDER — ACETAMINOPHEN 10 MG/ML IV SOLN: 1000.0000 mg | Freq: Once | INTRAVENOUS | Status: DC | PRN

## 2021-10-05 MED ORDER — MENTHOL 3 MG MT LOZG
1.0000 | LOZENGE | OROMUCOSAL | Status: DC | PRN
  Filled 2021-10-05: qty 9

## 2021-10-05 MED ORDER — CHLORHEXIDINE GLUCONATE 0.12 % MT SOLN
OROMUCOSAL | Status: AC
  Administered 2021-10-05: 15 mL via OROMUCOSAL
  Filled 2021-10-05: qty 15

## 2021-10-05 MED ORDER — ROCURONIUM BROMIDE 100 MG/10ML IV SOLN
INTRAVENOUS | Status: DC | PRN
  Administered 2021-10-05: 10 mg via INTRAVENOUS

## 2021-10-05 MED ORDER — CEFAZOLIN SODIUM-DEXTROSE 2-4 GM/100ML-% IV SOLN
2.0000 g | Freq: Once | INTRAVENOUS | Status: AC
  Administered 2021-10-05 (×2): 2 g via INTRAVENOUS

## 2021-10-05 MED ORDER — VENLAFAXINE HCL ER 150 MG PO CP24
150.0000 mg | ORAL_CAPSULE | Freq: Every day | ORAL | Status: DC
  Administered 2021-10-06 – 2021-10-09 (×4): 150 mg via ORAL
  Filled 2021-10-05 (×4): qty 1

## 2021-10-05 MED ORDER — GABAPENTIN 300 MG PO CAPS
300.0000 mg | ORAL_CAPSULE | Freq: Two times a day (BID) | ORAL | Status: DC
  Administered 2021-10-06 – 2021-10-09 (×7): 300 mg via ORAL
  Filled 2021-10-05 (×7): qty 1

## 2021-10-05 MED ORDER — CEFAZOLIN SODIUM-DEXTROSE 2-4 GM/100ML-% IV SOLN
INTRAVENOUS | Status: AC
  Filled 2021-10-05: qty 100

## 2021-10-05 MED ORDER — REMIFENTANIL HCL 1 MG IV SOLR
INTRAVENOUS | Status: AC
  Filled 2021-10-05: qty 1000

## 2021-10-05 MED ORDER — SODIUM CHLORIDE 0.9 % IV SOLN: 250.0000 mL | INTRAVENOUS | Status: DC

## 2021-10-05 MED ORDER — HYDROMORPHONE HCL 1 MG/ML IJ SOLN
INTRAMUSCULAR | Status: DC | PRN
  Administered 2021-10-05: 1 mg via INTRAVENOUS

## 2021-10-05 MED ORDER — ACETAMINOPHEN 10 MG/ML IV SOLN
INTRAVENOUS | Status: DC | PRN
  Administered 2021-10-05: 1000 mg via INTRAVENOUS

## 2021-10-05 MED ORDER — SODIUM CHLORIDE 0.9 % IV SOLN: INTRAVENOUS | Status: DC

## 2021-10-05 MED ORDER — PROPOFOL 10 MG/ML IV BOLUS
INTRAVENOUS | Status: DC | PRN
  Administered 2021-10-05: 200 mg via INTRAVENOUS

## 2021-10-05 MED ORDER — EPHEDRINE SULFATE 50 MG/ML IJ SOLN
INTRAMUSCULAR | Status: DC | PRN
  Administered 2021-10-05: 10 mg via INTRAVENOUS
  Administered 2021-10-05 (×2): 5 mg via INTRAVENOUS

## 2021-10-05 MED ORDER — PRONTOSAN WOUND IRRIGATION OPTIME
TOPICAL | Status: DC | PRN
  Administered 2021-10-05: 1 via TOPICAL

## 2021-10-05 MED ORDER — DEXMEDETOMIDINE HCL IN NACL 400 MCG/100ML IV SOLN
INTRAVENOUS | Status: DC | PRN
  Administered 2021-10-05 (×2): 8 ug via INTRAVENOUS
  Administered 2021-10-05: 12 ug via INTRAVENOUS

## 2021-10-05 MED ORDER — APREPITANT 40 MG PO CAPS
ORAL_CAPSULE | ORAL | Status: AC
  Administered 2021-10-05: 40 mg via ORAL
  Filled 2021-10-05: qty 1

## 2021-10-05 MED ORDER — OXYCODONE HCL 5 MG PO TABS: 5.0000 mg | ORAL_TABLET | Freq: Once | ORAL | Status: DC | PRN

## 2021-10-05 MED ORDER — BISACODYL 10 MG RE SUPP: 10.0000 mg | Freq: Every day | RECTAL | Status: DC | PRN

## 2021-10-05 MED ORDER — BUPIVACAINE-EPINEPHRINE (PF) 0.5% -1:200000 IJ SOLN
INTRAMUSCULAR | Status: DC | PRN
  Administered 2021-10-05: 10 mL

## 2021-10-05 MED ORDER — ONDANSETRON HCL 4 MG/2ML IJ SOLN: 4.0000 mg | Freq: Once | INTRAMUSCULAR | Status: DC | PRN

## 2021-10-05 MED ORDER — LEVOTHYROXINE SODIUM 112 MCG PO TABS
112.0000 ug | ORAL_TABLET | Freq: Every day | ORAL | Status: DC
  Administered 2021-10-06 – 2021-10-09 (×4): 112 ug via ORAL
  Filled 2021-10-05 (×4): qty 1

## 2021-10-05 MED ORDER — FLEET ENEMA 7-19 GM/118ML RE ENEM: 1.0000 | ENEMA | Freq: Once | RECTAL | Status: DC | PRN

## 2021-10-05 MED ORDER — OXYCODONE HCL 5 MG PO TABS
10.0000 mg | ORAL_TABLET | ORAL | Status: DC | PRN
  Administered 2021-10-06 – 2021-10-08 (×10): 10 mg via ORAL
  Filled 2021-10-05 (×10): qty 2

## 2021-10-05 MED ORDER — GABAPENTIN 300 MG PO CAPS
600.0000 mg | ORAL_CAPSULE | Freq: Every day | ORAL | Status: DC
  Administered 2021-10-05 – 2021-10-08 (×4): 600 mg via ORAL
  Filled 2021-10-05 (×4): qty 2

## 2021-10-05 MED ORDER — ONDANSETRON HCL 4 MG/2ML IJ SOLN
4.0000 mg | Freq: Four times a day (QID) | INTRAMUSCULAR | Status: DC | PRN
  Administered 2021-10-05: 4 mg via INTRAVENOUS
  Filled 2021-10-05: qty 2

## 2021-10-05 MED ORDER — SURGIFLO WITH THROMBIN (HEMOSTATIC MATRIX KIT) OPTIME
TOPICAL | Status: DC | PRN
  Administered 2021-10-05 (×2): 1 via TOPICAL

## 2021-10-05 MED ORDER — FENTANYL CITRATE (PF) 100 MCG/2ML IJ SOLN
25.0000 ug | INTRAMUSCULAR | Status: DC | PRN
  Administered 2021-10-05 (×3): 25 ug via INTRAVENOUS

## 2021-10-05 MED ORDER — LOSARTAN POTASSIUM 50 MG PO TABS
100.0000 mg | ORAL_TABLET | Freq: Every day | ORAL | Status: DC
  Administered 2021-10-07 – 2021-10-09 (×3): 100 mg via ORAL
  Filled 2021-10-05 (×3): qty 2

## 2021-10-05 MED ORDER — GLYCOPYRROLATE 0.2 MG/ML IJ SOLN
INTRAMUSCULAR | Status: DC | PRN
  Administered 2021-10-05: .2 mg via INTRAVENOUS

## 2021-10-05 MED ORDER — MONTELUKAST SODIUM 10 MG PO TABS
10.0000 mg | ORAL_TABLET | Freq: Every day | ORAL | Status: DC
  Administered 2021-10-05 – 2021-10-08 (×4): 10 mg via ORAL
  Filled 2021-10-05 (×5): qty 1

## 2021-10-05 MED ORDER — CYCLOBENZAPRINE HCL 10 MG PO TABS
10.0000 mg | ORAL_TABLET | Freq: Three times a day (TID) | ORAL | Status: DC
  Administered 2021-10-05 – 2021-10-06 (×3): 10 mg via ORAL
  Filled 2021-10-05 (×2): qty 1

## 2021-10-05 MED ORDER — SODIUM CHLORIDE (PF) 0.9 % IJ SOLN
INTRAMUSCULAR | Status: DC | PRN
  Administered 2021-10-05: 60 mL

## 2021-10-05 MED ORDER — SODIUM CHLORIDE 0.9 % IV SOLN: Freq: Once | INTRAVENOUS | Status: AC

## 2021-10-05 MED ORDER — ACETAMINOPHEN 500 MG PO TABS
1000.0000 mg | ORAL_TABLET | Freq: Four times a day (QID) | ORAL | Status: AC
  Administered 2021-10-05 – 2021-10-06 (×3): 1000 mg via ORAL
  Filled 2021-10-05 (×4): qty 2

## 2021-10-05 MED ORDER — SODIUM CHLORIDE 0.9% FLUSH: 3.0000 mL | INTRAVENOUS | Status: DC | PRN

## 2021-10-05 MED ORDER — CLONAZEPAM 0.25 MG PO TBDP: 0.2500 mg | ORAL_TABLET | Freq: Two times a day (BID) | ORAL | Status: DC | PRN

## 2021-10-05 MED ORDER — ORAL CARE MOUTH RINSE: 15.0000 mL | Freq: Once | OROMUCOSAL | Status: AC

## 2021-10-05 MED ORDER — LACTATED RINGERS IV SOLN: INTRAVENOUS | Status: DC

## 2021-10-05 MED ORDER — MIDAZOLAM HCL 2 MG/2ML IJ SOLN
INTRAMUSCULAR | Status: DC | PRN
  Administered 2021-10-05: 2 mg via INTRAVENOUS

## 2021-10-05 MED ORDER — VANCOMYCIN HCL 1000 MG IV SOLR
INTRAVENOUS | Status: DC | PRN
  Administered 2021-10-05: 1000 mg via TOPICAL

## 2021-10-05 MED ORDER — PANTOPRAZOLE SODIUM 40 MG PO TBEC
40.0000 mg | DELAYED_RELEASE_TABLET | Freq: Every day | ORAL | Status: DC
  Administered 2021-10-06 – 2021-10-09 (×4): 40 mg via ORAL
  Filled 2021-10-05 (×4): qty 1

## 2021-10-05 MED ORDER — VANCOMYCIN HCL IN DEXTROSE 1-5 GM/200ML-% IV SOLN
INTRAVENOUS | Status: AC
  Filled 2021-10-05: qty 200

## 2021-10-05 MED ORDER — POLYETHYLENE GLYCOL 3350 17 G PO PACK: 17.0000 g | PACK | Freq: Every day | ORAL | Status: DC | PRN

## 2021-10-05 MED ORDER — PHENOL 1.4 % MT LIQD
1.0000 | OROMUCOSAL | Status: DC | PRN
  Filled 2021-10-05: qty 177

## 2021-10-05 MED ORDER — CHLORHEXIDINE GLUCONATE 0.12 % MT SOLN: 15.0000 mL | Freq: Once | OROMUCOSAL | Status: AC

## 2021-10-05 MED ORDER — APREPITANT 40 MG PO CAPS: 40.0000 mg | ORAL_CAPSULE | Freq: Once | ORAL | Status: AC

## 2021-10-05 MED ORDER — SODIUM CHLORIDE 0.9% FLUSH
3.0000 mL | Freq: Two times a day (BID) | INTRAVENOUS | Status: DC
  Administered 2021-10-05 – 2021-10-09 (×7): 3 mL via INTRAVENOUS

## 2021-10-05 SURGICAL SUPPLY — 77 items
ADH SKN CLS APL DERMABOND .7 (GAUZE/BANDAGES/DRESSINGS) ×2
AGENT HMST KT MTR STRL THRMB (HEMOSTASIS) ×2
APL PRP STRL LF DISP 70% ISPRP (MISCELLANEOUS) ×4
BONE CANC CHIPS 40CC CAN1/2 (Bone Implant) ×3 IMPLANT
BULB RESERV EVAC DRAIN JP 100C (MISCELLANEOUS) ×3 IMPLANT
BUR NEURO DRILL SOFT 3.0X3.8M (BURR) ×3 IMPLANT
CAGE SABLE 10X22 7-13 15D (Cage) ×3 IMPLANT
CAP LOCKING THREADED (Cap) ×24 IMPLANT
CHIPS CANC BONE 40CC CAN1/2 (Bone Implant) ×2 IMPLANT
CHLORAPREP W/TINT 26 (MISCELLANEOUS) ×6 IMPLANT
CNTNR SPEC 2.5X3XGRAD LEK (MISCELLANEOUS) ×6
CONT SPEC 4OZ STER OR WHT (MISCELLANEOUS) ×3
CONT SPEC 4OZ STRL OR WHT (MISCELLANEOUS) ×6
CONTAINER SPEC 2.5X3XGRAD LEK (MISCELLANEOUS) ×6 IMPLANT
CORD BIPOLAR FORCEPS 12FT (ELECTRODE) ×3 IMPLANT
COUNTER NEEDLE 20/40 LG (NEEDLE) ×3 IMPLANT
CUP MEDICINE 2OZ PLAST GRAD ST (MISCELLANEOUS) ×3 IMPLANT
DERMABOND ADVANCED (GAUZE/BANDAGES/DRESSINGS) ×1
DERMABOND ADVANCED .7 DNX12 (GAUZE/BANDAGES/DRESSINGS) ×2 IMPLANT
DRAIN CHANNEL JP 10F RND 20C F (MISCELLANEOUS) ×3 IMPLANT
DRAPE C ARM PK CFD 31 SPINE (DRAPES) ×3 IMPLANT
DRAPE LAPAROTOMY 100X77 ABD (DRAPES) ×3 IMPLANT
DRAPE MICROSCOPE SPINE 48X150 (DRAPES) IMPLANT
DRAPE SURG 17X11 SM STRL (DRAPES) ×6 IMPLANT
DRSG TEGADERM 4X4.75 (GAUZE/BANDAGES/DRESSINGS) ×9 IMPLANT
ELECT CAUTERY BLADE TIP 2.5 (TIP)
ELECT COATED BLADE 2.86 ST (ELECTRODE) ×3 IMPLANT
ELECT EZSTD 165MM 6.5IN (MISCELLANEOUS) ×3
ELECT REM PT RETURN 9FT ADLT (ELECTROSURGICAL) ×3
ELECTRODE CAUTERY BLDE TIP 2.5 (TIP) IMPLANT
ELECTRODE EZSTD 165MM 6.5IN (MISCELLANEOUS) ×2 IMPLANT
ELECTRODE REM PT RTRN 9FT ADLT (ELECTROSURGICAL) ×2 IMPLANT
GAUZE 4X4 16PLY ~~LOC~~+RFID DBL (SPONGE) ×3 IMPLANT
GLOVE SURG SYN 6.5 ES PF (GLOVE) ×9 IMPLANT
GLOVE SURG SYN 8.5  E (GLOVE) ×9
GLOVE SURG SYN 8.5 E (GLOVE) ×6 IMPLANT
GLOVE SURG UNDER POLY LF SZ6.5 (GLOVE) ×3 IMPLANT
GOWN SRG LRG LVL 4 IMPRV REINF (GOWNS) ×2 IMPLANT
GOWN SRG XL LVL 3 NONREINFORCE (GOWNS) ×2 IMPLANT
GOWN STRL NON-REIN TWL XL LVL3 (GOWNS) ×3
GOWN STRL REIN LRG LVL4 (GOWNS) ×3
GRADUATE 1200CC STRL 31836 (MISCELLANEOUS) ×3 IMPLANT
HEMOVAC 400CC 10FR (MISCELLANEOUS) ×6 IMPLANT
HOLDER FOLEY CATH W/STRAP (MISCELLANEOUS) ×3 IMPLANT
KIT ASP BONE MRW 120CC (KITS) ×3 IMPLANT
KIT SPINAL PRONEVIEW (KITS) ×3 IMPLANT
MANIFOLD NEPTUNE II (INSTRUMENTS) ×3 IMPLANT
MARKER SKIN DUAL TIP RULER LAB (MISCELLANEOUS) ×6 IMPLANT
NDL SAFETY ECLIPSE 18X1.5 (NEEDLE) ×2 IMPLANT
NEEDLE HYPO 18GX1.5 SHARP (NEEDLE) ×3
NEEDLE HYPO 22GX1.5 SAFETY (NEEDLE) ×3 IMPLANT
NS IRRIG 1000ML POUR BTL (IV SOLUTION) ×3 IMPLANT
PACK LAMINECTOMY NEURO (CUSTOM PROCEDURE TRAY) ×3 IMPLANT
PREVENA INCISION MGT 90 150 (MISCELLANEOUS) ×3 IMPLANT
PUTTY DBX 10CC (Bone Implant) ×6 IMPLANT
ROD TI CVD 5.5MMX90MM (Rod) ×6 IMPLANT
SCREW CREO SPINAL 6.5X45 (Screw) ×18 IMPLANT
SCREW CREO SPINAL 7.5X40 (Screw) ×6 IMPLANT
SOLUTION PRONTOSAN WOUND 350ML (IRRIGATION / IRRIGATOR) ×3 IMPLANT
SPONGE GAUZE 2X2 8PLY STRL LF (GAUZE/BANDAGES/DRESSINGS) ×6 IMPLANT
SURGIFLO W/THROMBIN 8M KIT (HEMOSTASIS) ×3 IMPLANT
SUT DVC VLOC 3-0 CL 6 P-12 (SUTURE) ×3 IMPLANT
SUT ETHILON 3-0 FS-10 30 BLK (SUTURE) ×6
SUT V-LOC 90 ABS DVC 3-0 CL (SUTURE) ×6 IMPLANT
SUT VIC AB 0 CT1 18XCR BRD 8 (SUTURE) ×2 IMPLANT
SUT VIC AB 0 CT1 27 (SUTURE) ×6
SUT VIC AB 0 CT1 27XCR 8 STRN (SUTURE) ×4 IMPLANT
SUT VIC AB 0 CT1 8-18 (SUTURE) ×3
SUT VIC AB 2-0 CT1 18 (SUTURE) ×9 IMPLANT
SUT VIC AB 3-0 SH 8-18 (SUTURE) ×3 IMPLANT
SUTURE EHLN 3-0 FS-10 30 BLK (SUTURE) ×4 IMPLANT
SYR 30ML LL (SYRINGE) ×6 IMPLANT
SYR 3ML LL SCALE MARK (SYRINGE) ×3 IMPLANT
TOWEL OR 17X26 4PK STRL BLUE (TOWEL DISPOSABLE) ×9 IMPLANT
TRAY FOLEY MTR SLVR 16FR STAT (SET/KITS/TRAYS/PACK) ×3 IMPLANT
TUBING CONNECTING 10 (TUBING) ×6 IMPLANT
WATER STERILE IRR 500ML POUR (IV SOLUTION) IMPLANT

## 2021-10-05 NOTE — H&P (Signed)
I have reviewed and confirmed my history and physical from 09/15/21 with no additions or changes. Plan for L3-S1 posterior fusion with L3-5 decompression and L5-S1 TLIF.  Risks and benefits reviewed.  Heart sounds normal no MRG. Chest Clear to Auscultation Bilaterally.

## 2021-10-05 NOTE — Anesthesia Procedure Notes (Signed)
Procedure Name: Intubation Date/Time: 10/05/2021 11:29 AM Performed by: Mohammed Kindle, CRNA Pre-anesthesia Checklist: Patient identified, Emergency Drugs available, Suction available and Patient being monitored Patient Re-evaluated:Patient Re-evaluated prior to induction Oxygen Delivery Method: Circle system utilized Preoxygenation: Pre-oxygenation with 100% oxygen Induction Type: IV induction Ventilation: Mask ventilation without difficulty Laryngoscope Size: McGraph and 3 Grade View: Grade I Tube type: Oral Tube size: 7.0 mm Number of attempts: 1 Airway Equipment and Method: Stylet and Oral airway Placement Confirmation: ETT inserted through vocal cords under direct vision, positive ETCO2, breath sounds checked- equal and bilateral and CO2 detector Secured at: 21 cm Tube secured with: Tape Dental Injury: Teeth and Oropharynx as per pre-operative assessment

## 2021-10-05 NOTE — Transfer of Care (Signed)
Immediate Anesthesia Transfer of Care Note  Patient: Jane [name]  Procedure(s) Performed: L3-5 DECOMPRESSION L3-S1 POSTERIOR LUMBAR FUSION L5-S1 TRANSFORAMINAL LUMBAR INTERBODY FUSION (TLIF) BONE MARROW ASPIRATION FOR SPINE FUSION ONLY (BMAC) APPLICATION OF WOUND VAC (Back)  Patient Location: PACU  Anesthesia Type:General  Level of Consciousness: awake  Airway & Oxygen Therapy: Patient Spontanous Breathing  Post-op Assessment: Report given to RN  Post vital signs: stable  Last Vitals:  Vitals Value Taken Time  BP    Temp    Pulse 95 10/05/21 1559  Resp 10 10/05/21 1559  SpO2 98 % 10/05/21 1559  Vitals shown include unvalidated device data.  Last Pain:  Vitals:   10/05/21 1023  TempSrc: Oral  PainSc: 5          Complications: No notable events documented.

## 2021-10-05 NOTE — Op Note (Addendum)
Indications: Ms. 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  Findings: correction of anterolisthesis, pars defect  Preoperative Diagnosis: Lumbar Stenosis with neurogenic claudication, Pars defect of the lumbar spine, anterolisthesis Postoperative Diagnosis: same   EBL: 600 ml IVF: 1500 ml Drains: 3 placed Disposition: Extubated and Stable to PACU Complications: none  A foley catheter was placed.   Preoperative Note:   Risks of surgery discussed include: infection, bleeding, stroke, coma, death, paralysis, CSF leak, nerve/spinal cord injury, numbness, tingling, weakness, complex regional pain syndrome, recurrent stenosis and/or disc herniation, vascular injury, development of instability, neck/back pain, need for further surgery, persistent symptoms, development of deformity, and the risks of anesthesia. The patient understood these risks and agreed to proceed.  Operative Note:  1. Transforaminal Lumbar Interbody Fusion L5/S1 2. Posterolateral arthrodesis L3 to S1 3. Posterior segmental instrumentation L3 to S1 using Globus Creo 4. Lumbar decompression including central decompression, bilateral medial facetectomies, and bilateral foraminotomies at L3/4 and L4/5 5. Harvesting of autograft via the same incision 6. Placement of a biomechanical device (Globus Sable) at L5/S1 for anterior arthrodesis    The patient was brought to the Operating Room, intubated and turned into the prone position. All pressure points were checked and double checked. Flouroscopy was used to mark the incision. The patient was prepped and draped in the standard fashion. A full timeout was performed. Preoperative antibiotics were given. The incision was injected with local anesthetic.  The incision was opened with a  scalpel, then the soft tissues divided with the Bovie. Self-retaining retractors were placed. The paraspinus muscles were reflected laterally in subperiosteal fashion until the transverse processes were visible. Flouroscopy was used to confirm our localization.  Via a separate incision, 120 ml of bone marrow were aspirated from the R iliac crest for use in arthrodesis later in the case.   The self-retaining retractors were repositioned. We then placed pedicle screws.   At L3 on one side, a starting point was chosen based on anatomic landmarks, then breached with a high speed drill. A pedicle finder probe was used to cannulate the pedicle, then the balltip probe used to confirm lack of breach. The tract was tapped, re-checked with the balltip probe, then a 6.5 x 45 mm pedicle screw was placed. The procedure was then repeated contralaterally and the same size screw placed.  At L4 on one side, a starting point was chosen based on anatomic landmarks, then breached with a high speed drill. A pedicle finder probe was used to cannulate the pedicle, then the balltip probe used to confirm lack of breach. The tract was tapped, re-checked with the balltip probe, then a 6.5 x 45 mm pedicle screw was placed. The procedure was then repeated contralaterally and the same size screw placed.  At L5 on one side, a starting point was chosen based on anatomic landmarks, then breached with a high speed drill. A pedicle finder probe was used to cannulate the pedicle, then the balltip probe used to confirm lack of breach. The tract was tapped, re-checked with the balltip probe, then a 6.5 x 45 mm pedicle screw was placed. The procedure was then repeated contralaterally and the same size screw placed.  At S1 on one side, a starting point was chosen based on anatomic landmarks, then breached with a high speed drill. A pedicle finder probe was used  to cannulate the pedicle, then the balltip probe used to confirm lack of breach.  The tract was tapped, re-checked with the balltip probe, then a 7.5 x 40 mm pedicle screw was placed. The procedure was then repeated contralaterally and the same size screw placed.  The screws were checked with radiographs, where the right L3 screw was noted to be slightly superiorly placed with a small breach of the superior endplate.  This screw was removed and repositioned.  After placement of pedicle screws, a screw-to-screw distractor was placed to distract the disc space. We then turned attention to performing the transforaminal decompression and interbody fusion. The gill fragment was disconnected from the pars defect and the L5-S1 facet joints bilaterally.  The L5 Gill fragment was then removed.   The traversing and exiting nerve roots on each side were identified and protected. The disc was opened using a scalpel. After incising the disc space, we took a combination of pituitary rongeurs, Kerrison rongeurs, disc scrapers, and curettes to remove a majority of the disc material.  We prepared the end plates for accepting the interbody fusion.  We removed the cartilaginous plate, preserved the cortical endplate if possible during this procedure.  We serially dilated up in order to increase the size of the disc space, while protecting the traversing and exiting nerve roots, until we had sized up to a trial.    The trial was removed, and the disc space packed with autograft and allograft. The Globus Sable TLIF biomechanical device was inserted, expanded, then packed with a mixture of allograft and autograft with care taken to protect the nerve roots and thecal sac. After placement of the device, the screw-screw distractor was removed.  After placement of the biomechanical device, additional compression of the neural elements was noted. To decompress the neural elements at L3/4, a foraminotomy was made on the right and then extended until the base of the spinous process was removed.  The ligamentum was  identified and removed.  A balltip probe was used to confirm decompression.  The drill was used to perform L4 laminectomy, which was completed with 3 and 4 mm Kerrison punches.  At the end of decompression, L3-4 and L4-5 laminectomy and medial facetectomies and foraminotomies had been performed.  The balltip feeler was used to confirm decompression of the L4, L5, and S1 nerve roots bilaterally, as well as the central canal.  Rods were measured to length, cut, and shaped. The rods were secured using locking caps to manufacturer's specifications. Final AP and lateral radiographs were taken to confirm placement of instrumentation and appropriate alignment. The wound was copiously irrigated, then the external surfaces of the remaining lamina, facet, and transverse processes from L3 to S1 were decorticated. A mixture of allograft and autograft was placed over the decorticated surfaces for arthrodesis.  2 drains were placed subfascially. 1 was placed above the fascia.  After hemostasis, the wound was closed in layers with 0 and 2-0 vicryl. 3-0 monocryl and a wound vac  were applied to the incision.  The patient was then flipped supine and extubated with incident. All counts were correct times 2 at the end of the case. No immediate complications were noted.  Manning Charity PA assisted in the entire procedure.  Venetia Night MD

## 2021-10-05 NOTE — Anesthesia Preprocedure Evaluation (Addendum)
Anesthesia Evaluation  Patient identified by MRN, date of birth, ID band Patient awake    Reviewed: Allergy & Precautions, NPO status , Patient's Chart, lab work & pertinent test results  History of Anesthesia Complications (+) PONV and history of anesthetic complications  Airway Mallampati: III   Neck ROM: Full    Dental   Missing lower right molars:   Pulmonary asthma , former smoker (quit 1995),    Pulmonary exam normal breath sounds clear to auscultation       Cardiovascular hypertension, Normal cardiovascular exam+ Valvular Problems/Murmurs MR and MVP  Rhythm:Regular Rate:Normal  ECG 09/27/21:  Normal sinus rhythm Normal ECG   Neuro/Psych PSYCHIATRIC DISORDERS Anxiety negative neurological ROS     GI/Hepatic GERD  ,  Endo/Other  Hypothyroidism Obesity   Renal/GU negative Renal ROS     Musculoskeletal  (+) Arthritis , Fibromyalgia -  Abdominal   Peds  Hematology negative hematology ROS (+)   Anesthesia Other Findings   Reproductive/Obstetrics                            Anesthesia Physical Anesthesia Plan  ASA: 3  Anesthesia Plan: General   Post-op Pain Management:    Induction: Intravenous  PONV Risk Score and Plan: 4 or greater and Ondansetron, Dexamethasone and Treatment may vary due to age or medical condition  Airway Management Planned: Oral ETT  Additional Equipment:   Intra-op Plan:   Post-operative Plan: Extubation in OR  Informed Consent: I have reviewed the patients History and Physical, chart, labs and discussed the procedure including the risks, benefits and alternatives for the proposed anesthesia with the patient or authorized representative who has indicated his/her understanding and acceptance.     Dental advisory given  Plan Discussed with: CRNA  Anesthesia Plan Comments: (Patient consented for risks of anesthesia including but not limited to:  -  adverse reactions to medications - damage to eyes, teeth, lips or other oral mucosa - nerve damage due to positioning  - sore throat or hoarseness - damage to heart, brain, nerves, lungs, other parts of body or loss of life  Patient voiced understanding.)        Anesthesia Quick Evaluation

## 2021-10-06 ENCOUNTER — Encounter: Payer: Self-pay | Admitting: Neurosurgery

## 2021-10-06 LAB — BASIC METABOLIC PANEL
Anion gap: 6 (ref 5–15)
BUN: 19 mg/dL (ref 8–23)
CO2: 26 mmol/L (ref 22–32)
Calcium: 8.2 mg/dL — ABNORMAL LOW (ref 8.9–10.3)
Chloride: 106 mmol/L (ref 98–111)
Creatinine, Ser: 0.68 mg/dL (ref 0.44–1.00)
GFR, Estimated: >60 mL/min (ref >60)
Glucose, Bld: 132 mg/dL — ABNORMAL HIGH (ref 70–99)
Potassium: 4.5 mmol/L (ref 3.5–5.1)
Sodium: 138 mmol/L (ref 135–145)

## 2021-10-06 LAB — CBC
HCT: 34.2 % — ABNORMAL LOW (ref 36.0–46.0)
Hemoglobin: 10.8 g/dL — ABNORMAL LOW (ref 12.0–15.0)
MCH: 28.3 pg (ref 26.0–34.0)
MCHC: 31.6 g/dL (ref 30.0–36.0)
MCV: 89.8 fL (ref 80.0–100.0)
Platelets: 255 10*3/uL (ref 150–400)
RBC: 3.81 MIL/uL — ABNORMAL LOW (ref 3.87–5.11)
RDW: 13.2 % (ref 11.5–15.5)
WBC: 15.7 10*3/uL — ABNORMAL HIGH (ref 4.0–10.5)
nRBC: 0 % (ref 0.0–0.2)

## 2021-10-06 MED ORDER — ENOXAPARIN SODIUM 40 MG/0.4ML IJ SOSY
40.0000 mg | PREFILLED_SYRINGE | INTRAMUSCULAR | Status: DC
  Administered 2021-10-06 – 2021-10-09 (×4): 40 mg via SUBCUTANEOUS
  Filled 2021-10-06 (×4): qty 0.4

## 2021-10-06 MED ORDER — MORPHINE SULFATE (PF) 2 MG/ML IV SOLN: 2.0000 mg | INTRAVENOUS | Status: AC | PRN

## 2021-10-06 MED ORDER — PROPOFOL 500 MG/50ML IV EMUL
INTRAVENOUS | Status: AC
  Filled 2021-10-06: qty 50

## 2021-10-06 MED ORDER — CYCLOBENZAPRINE HCL 10 MG PO TABS
10.0000 mg | ORAL_TABLET | Freq: Three times a day (TID) | ORAL | Status: DC | PRN
  Administered 2021-10-06 – 2021-10-09 (×4): 10 mg via ORAL
  Filled 2021-10-06 (×4): qty 1

## 2021-10-06 NOTE — Progress Notes (Addendum)
Physical Therapy Treatment Patient Details Name: Jane [name] MRN: [id number] DOB: May 26, 1961 Today's Date: 10/06/2021   History of Present Illness Patient is a  60 year old female with anterolisthesis, pars defect. s/p TLIF L5/S1 on 10/05/21. PMHx includes HTN, asthma, anxiety, hypothyroidism, GERD, and arthritis.    PT Comments    Patient agreeable to PT. She is somewhat groggy and reports taking pain medication recently. Blood pressure 125/54 in sitting position with no dizziness reported with upright activity. Patient able to get out bed and walk a short distance in room with brace in place using rolling walker. Occasional cues for safety with mobility, education on general spine precautions and logroll technique provided.  Recommend to continue PT to maximize independence and progress activity in preparation for home discharge.    Recommendations for follow up therapy are one component of a multi-disciplinary discharge planning process, led by the attending physician.  Recommendations may be updated based on patient status, additional functional criteria and insurance authorization.  Follow Up Recommendations  Home health PT     Assistance Recommended at Discharge Intermittent Supervision/Assistance  Equipment Recommendations  None recommended by PT    Recommendations for Other Services       Precautions / Restrictions Precautions Precautions: Back;Fall Required Braces or Orthoses: Spinal Brace Spinal Brace: Applied in sitting position;Lumbar corset Restrictions Weight Bearing Restrictions: No     Mobility  Bed Mobility Overal bed mobility: Needs Assistance Bed Mobility: Rolling;Sidelying to Sit;Sit to Supine Rolling: Supervision Sidelying to sit: Supervision   Sit to supine: Min assist   General bed mobility comments: assistance for LE support to return to bed. educated patient on logroll technique    Transfers Overall transfer level: Needs assistance Equipment  used: Rolling walker (2 wheels) Transfers: Sit to/from Stand Sit to Stand: Min guard           General transfer comment: Min guard for safety. brace donned in sitting position prior to standing mobility    Ambulation/Gait Ambulation/Gait assistance: Min guard Gait Distance (Feet): 30 Feet Assistive device: Rolling walker (2 wheels) Gait Pattern/deviations: Step-through pattern;Decreased stride length Gait velocity: decreased   General Gait Details: verbal cues for safety and technique using rolling walker. blood pressure 125/54 prior to ambulating and patient does not report dizziness with ambulation this session. further gait distance limited by grogginess   Stairs             Wheelchair Mobility    Modified Rankin (Stroke Patients Only)       Balance Overall balance assessment: Needs assistance Sitting-balance support: No upper extremity supported;Feet supported Sitting balance-Leahy Scale: Good     Standing balance support: Bilateral upper extremity supported;Reliant on assistive device for balance Standing balance-Leahy Scale: Fair Standing balance comment: no loss of balance while standing with UE supported on rolling walker. SBA provided for safety                            Cognition Arousal/Alertness: Lethargic;Suspect due to medications Behavior During Therapy: [facility] for tasks assessed/performed Overall Cognitive Status: Within Functional Limits for tasks assessed                                 General Comments: initially patient is groggy, just waking up from a nap and had pain medication recently. she is able to follow commands consistently without difficulty  Exercises      General Comments General comments (skin integrity, edema, etc.): patient educated on logroll technique and general spine precuations      Pertinent Vitals/Pain Pain Assessment: 0-10 Pain Score: 4  Pain Location: lower back Pain  Descriptors / Indicators: Sore Pain Intervention(s): Limited activity within patient's tolerance;Premedicated before session    Home Living                          Prior Function            PT Goals (current goals can now be found in the care plan section) Acute Rehab PT Goals Patient Stated Goal: to go home PT Goal Formulation: With patient Time For Goal Achievement: 10/20/21 Potential to Achieve Goals: Good Progress towards PT goals: Progressing toward goals    Frequency    BID      PT Plan Current plan remains appropriate    Co-evaluation              AM-PAC PT "6 Clicks" Mobility   Outcome Measure  Help needed turning from your back to your side while in a flat bed without using bedrails?: A Little Help needed moving from lying on your back to sitting on the side of a flat bed without using bedrails?: A Little Help needed moving to and from a bed to a chair (including a wheelchair)?: A Little Help needed standing up from a chair using your arms (e.g., wheelchair or bedside chair)?: A Little Help needed to walk in hospital room?: A Little Help needed climbing 3-5 steps with a railing? : A Little 6 Click Score: 18    End of Session Equipment Utilized During Treatment: Gait belt;Back brace Activity Tolerance: Patient tolerated treatment well Patient left: in bed;with call bell/phone within reach;with bed alarm set;with SCD's reapplied   PT Visit Diagnosis: Other abnormalities of gait and mobility (R26.89);Pain Pain - Right/Left:  (middle) Pain - part of body:  (lower back)     Time: 4356-8616 PT Time Calculation (min) (ACUTE ONLY): 31 min  Charges:  $Therapeutic Activity: 23-37 mins                     Donna Bernard, PT, MPT    Ina Homes 10/06/2021, 3:07 PM

## 2021-10-06 NOTE — Progress Notes (Signed)
    Attending Progress Note  History: Jane [name] is a 60 y.o presenting with lumbar spondylolisthesis, stenosis and radiculopathy s/p L3-S1 PSF, L3-5 decompression,  L5- S1 TLIF on 10/06/21.  POD#1: some hypotension overnight, improved with fluid bolus. Pressures continue to be soft. Pt tolerating pain well with current medication regimen.   Physical Exam: Vitals:   10/06/21 0433 10/06/21 0836  BP: (!) 102/59 (!) 107/58  Pulse: 89 75  Resp: 18 18  Temp: 97.8 F (36.6 C) 98.2 F (36.8 C)  SpO2: 96% 99%    AA Ox3 CNI  Strength:5/5 throughout BLE  Incision: wound vac in place without complications. HV output: HV #1 (deep): 50 HV #2 (deep): 30 HV # 3 (superficial): 15  Data:  Recent Labs  Lab 10/06/21 0638  NA 138  K 4.5  CL 106  CO2 26  BUN 19  CREATININE 0.68  GLUCOSE 132*  CALCIUM 8.2*   No results for input(s): AST, ALT, ALKPHOS in the last 168 hours.  Invalid input(s): TBILI   Recent Labs  Lab 10/06/21 0638  WBC 15.7*  HGB 10.8*  HCT 34.2*  PLT 255   No results for input(s): APTT, INR in the last 168 hours.       Other tests/results: none   Assessment/Plan:  Jane [name] is 60 y.o female presenting with lumbar spondylolisthesis, stenosis and radiculopathy s/p L3-S1 PSF, L3-5 decompression,  L5- S1 TLIF on 10/06/21.  - mobilize - pain control - hold BP medications for hypotension  - DVT prophylaxis - PT/OT  Manning Charity PA-C Department of Neurosurgery

## 2021-10-06 NOTE — TOC Progression Note (Signed)
Transition of Care [facility]) - Progression Note    Patient Details  Name: Jane [name] MRN: [id number] Date of Birth: 1961/02/23  Transition of [facility]) CM/SW Contact  Marlowe Sax, RN Phone Number: 10/06/2021, 11:22 AM  Clinical Narrative:   Spoke with the patient and her husband at the bedside She thinks she may have a walker at home, her husband will check to make sure and let me know, she has a raised toilet at home and does not need a 3 in1, She stated that she does not think she will need PT but if she does she is willing to go to Outpatient as I have not been able to find a HH agency to accept her Insurance,, She stated that if she will let me know if she plans to go to outpatient PT [facility]OC to continue to follow for needs        Expected Discharge Plan and Services                                                 Social Determinants of Health (SDOH) Interventions    Readmission Risk Interventions No flowsheet data found.

## 2021-10-06 NOTE — Evaluation (Signed)
Physical Therapy Evaluation Patient Details Name: Jane [name] MRN: [id number] DOB: 13-Aug-1961 Today's Date: 10/06/2021  History of Present Illness  Patient is a  60 year old female with anterolisthesis, pars defect. s/p TLIF L5/S1 on 10/05/21. PMHx includes HTN, asthma, anxiety, hypothyroidism, GERD, and arthritis.   Clinical Impression  Patient agreeable to PT and was already sitting up on edge of bed with OT present in the room. Patient required minimal assistance for lifting to stand. Mild dizziness is reported with upright activity that does not worsen with increased standing time. Patient ambulated in the hallway with brace in place using rolling walker with occasional cues for rolling walker use and Min guard assistance. Patient reported no increased pain with ambulation. Patient is not at her baseline level of functional mobility. Recommend PT follow up to maximize independence and progress activity in preparation for discharge home with family support.      Recommendations for follow up therapy are one component of a multi-disciplinary discharge planning process, led by the attending physician.  Recommendations may be updated based on patient status, additional functional criteria and insurance authorization.  Follow Up Recommendations Home health PT    Assistance Recommended at Discharge Intermittent Supervision/Assistance  Functional Status Assessment Patient has had a recent decline in their functional status and demonstrates the ability to make significant improvements in function in a reasonable and predictable amount of time.  Equipment Recommendations  None recommended by PT    Recommendations for Other Services       Precautions / Restrictions Precautions Precautions: Back Precaution Booklet Issued: Yes (comment) Precaution Comments: standard back precautions Required Braces or Orthoses: Spinal Brace Spinal Brace: Applied in sitting position;Lumbar corset (don/doff in  sitting, ok to remove for bed, amb to bathroom, and for showering) Restrictions Weight Bearing Restrictions: No      Mobility  Bed Mobility Overal bed mobility: Needs Assistance Bed Mobility: Rolling;Sidelying to Sit Rolling: Supervision Sidelying to sit: Supervision       General bed mobility comments: use of bed rails, increased effort, pain, but no direct assist required    Transfers Overall transfer level: Needs assistance Equipment used: Rolling walker (2 wheels) Transfers: Sit to/from Stand Sit to Stand: Min assist           General transfer comment: NT, seated EOB with PT for additional assessment    Ambulation/Gait Ambulation/Gait assistance: Min guard Gait Distance (Feet): 60 Feet Assistive device: Rolling walker (2 wheels) Gait Pattern/deviations: Decreased stride length Gait velocity: decreased   General Gait Details: verbal cues to stand closer to rolling walker for support and cues to roll walker instead of picking up for safety. mild dizziness in standing that does not worsen with activity  Stairs            Wheelchair Mobility    Modified Rankin (Stroke Patients Only)       Balance Overall balance assessment: Needs assistance Sitting-balance support: No upper extremity supported;Feet supported Sitting balance-Leahy Scale: Good     Standing balance support: Bilateral upper extremity supported;Reliant on assistive device for balance Standing balance-Leahy Scale: Fair Standing balance comment: no loss of balance while standing with UE supported on rolling walker. SBA provided for safety                             Pertinent Vitals/Pain Pain Assessment: 0-10 Pain Score: 8  Pain Location: with bed mobility, 3 at rest Pain Descriptors / Indicators: Aching  Pain Intervention(s): Limited activity within patient's tolerance;Monitored during session;Premedicated before session;Repositioned    Home Living Family/patient expects  to be discharged to:: Private residence Living Arrangements: Spouse/significant other Available Help at Discharge: Family;Available 24 hours/day Type of Home: House Home Access: Stairs to enter Entrance Stairs-Rails: Left Entrance Stairs-Number of Steps: 3   Home Layout: One level Home Equipment: Agricultural consultant (2 wheels);Cane - single point;BSC;Adaptive equipment      Prior Function Prior Level of Function : Independent/Modified Independent;Driving                     Hand Dominance   Dominant Hand: Right    Extremity/Trunk Assessment   Upper Extremity Assessment Upper Extremity Assessment: Overall WFL for tasks assessed    Lower Extremity Assessment Lower Extremity Assessment: Defer to PT evaluation    Cervical / Trunk Assessment Cervical / Trunk Assessment: Back Surgery (bulb drain, 2 hemovacs, 1 wound vac)  Communication   Communication: No difficulties  Cognition Arousal/Alertness: Awake/alert Behavior During Therapy: WFL for tasks assessed/performed Overall Cognitive Status: Within Functional Limits for tasks assessed                                          General Comments      Exercises    Assessment/Plan    PT Assessment Patient needs continued PT services  PT Problem List Decreased strength;Decreased range of motion;Decreased activity tolerance;Decreased balance;Decreased mobility;Decreased safety awareness;Pain;Decreased knowledge of use of DME       PT Treatment Interventions DME instruction;Gait training;Stair training;Therapeutic activities;Therapeutic exercise;Balance training;Neuromuscular re-education;Functional mobility training;Patient/family education    PT Goals (Current goals can be found in the Care Plan section)  Acute Rehab PT Goals Patient Stated Goal: to go home PT Goal Formulation: With patient Time For Goal Achievement: 10/20/21 Potential to Achieve Goals: Good    Frequency BID   Barriers to  discharge        Co-evaluation               AM-PAC PT "6 Clicks" Mobility  Outcome Measure Help needed turning from your back to your side while in a flat bed without using bedrails?: A Little Help needed moving from lying on your back to sitting on the side of a flat bed without using bedrails?: A Little Help needed moving to and from a bed to a chair (including a wheelchair)?: A Little Help needed standing up from a chair using your arms (e.g., wheelchair or bedside chair)?: A Little Help needed to walk in hospital room?: A Little Help needed climbing 3-5 steps with a railing? : A Little 6 Click Score: 18    End of Session Equipment Utilized During Treatment: Gait belt;Back brace Activity Tolerance: Patient tolerated treatment well Patient left: in chair;with call bell/phone within reach;with chair alarm set;with SCD's reapplied;with family/visitor present (spouse present at end of session)   PT Visit Diagnosis: Other abnormalities of gait and mobility (R26.89);Pain Pain - Right/Left:  (middle) Pain - part of body:  (lower back)    Time: 1173-5670 PT Time Calculation (min) (ACUTE ONLY): 25 min   Charges:   PT Evaluation $PT Eval Low Complexity: 1 Low PT Treatments $Gait Training: 8-22 mins        Donna Bernard, PT, MPT   Ina Homes 10/06/2021, 9:54 AM

## 2021-10-06 NOTE — Evaluation (Signed)
Occupational Therapy Evaluation Patient Details Name: Jane [name] MRN: [id number] DOB: [birth year] Today's Date: 10/06/2021   History of Present Illness 60yo female s/p TLIF L5/S1 on 10/05/21. PMHx includes HTN, asthma, anxiety, hypothyroidism, GERD, and arthritis.   Clinical Impression   Pt seen for OT evaluation this date, POD#1 from lumbar surgery above. Prior to hospital admission, pt was independent with mobility, ADL, and IADL. No falls in past 6 months. However, has been having increased difficulty with ADL/IADL due to back pain. Pt lives with spouse in a single family home with 3 steps to enter and L handrail. Spouse able to provide 24/7 assist/support as needed for pt. Currently pt requires sueprvision + VC for sequencing for log roll bed mobility. Pt tolerated sitting EOB and after initial instruction able to return demo proper application of lumbar corset brace. Pt instructed in back precautions and introduced how to maintain during ADL/mobility. Pt verbalized understanding of all education/training provided. Handout provided to support recall and carry over of learned precautions/techniques for bed mobility, functional transfers, and self care skills. Pt will benefit from additional skilled OT services while hospitalized to maximize return to PLOF and minimize risk of falls. Upon hospital discharge, do not anticipate skilled OT needs. Will follow up next date around 10am per pt request for OT treatment with spouse present.    Recommendations for follow up therapy are one component of a multi-disciplinary discharge planning process, led by the attending physician.  Recommendations may be updated based on patient status, additional functional criteria and insurance authorization.   Follow Up Recommendations  No OT follow up    Assistance Recommended at Discharge PRN  Functional Status Assessment  Patient has had a recent decline in their functional status and demonstrates the ability  to make significant improvements in function in a reasonable and predictable amount of time.  Equipment Recommendations  Tub/shower seat    Recommendations for Other Services       Precautions / Restrictions Precautions Precautions: Back Precaution Booklet Issued: Yes (comment) Precaution Comments: standard back precautions Required Braces or Orthoses: Spinal Brace Spinal Brace: Applied in sitting position;Lumbar corset (don/doff in sitting, ok to remove for bed, amb to bathroom, and for showering) Restrictions Weight Bearing Restrictions: No      Mobility Bed Mobility Overal bed mobility: Needs Assistance Bed Mobility: Rolling;Sidelying to Sit Rolling: Supervision Sidelying to sit: Supervision       General bed mobility comments: use of bed rails, increased effort, pain, but no direct assist required    Transfers                   General transfer comment: NT, seated EOB with PT for additional assessment      Balance Overall balance assessment: Needs assistance Sitting-balance support: No upper extremity supported;Feet supported Sitting balance-Leahy Scale: Good                                     ADL either performed or assessed with clinical judgement   ADL                                         General ADL Comments: Pt currently requires MIN A for LB ADL 2/2 back precautions and pain. Indep with UB ADL from seated position. Pt reports spouse will be  able to assist.     Vision         Perception     Praxis      Pertinent Vitals/Pain Pain Assessment: 0-10 Pain Score: 8  Pain Location: with bed mobility, 3 at rest Pain Descriptors / Indicators: Aching Pain Intervention(s): Limited activity within patient's tolerance;Monitored during session;Premedicated before session;Repositioned     Hand Dominance Right   Extremity/Trunk Assessment Upper Extremity Assessment Upper Extremity Assessment: Overall WFL for  tasks assessed   Lower Extremity Assessment Lower Extremity Assessment: Defer to PT evaluation   Cervical / Trunk Assessment Cervical / Trunk Assessment: Back Surgery (bulb drain, 2 hemovacs, 1 wound vac)   Communication Communication Communication: No difficulties   Cognition Arousal/Alertness: Awake/alert Behavior During Therapy: WFL for tasks assessed/performed Overall Cognitive Status: Within Functional Limits for tasks assessed                                       General Comments       Exercises Other Exercises Other Exercises: Pt instructed in lumbar corset brace and pt able to return demo understanding to apply the brace in sitting EOB. Other Exercises: Pt instructed in back precautions and introduced how to maintain during ADL/mobility. Handout provided.   Shoulder Instructions      Home Living Family/patient expects to be discharged to:: Private residence Living Arrangements: Spouse/significant other Available Help at Discharge: Family;Available 24 hours/day Type of Home: House Home Access: Stairs to enter [employer] of Steps: 3 Entrance Stairs-Rails: Left Home Layout: One level     Bathroom Shower/Tub: Tub/shower unit;Walk-in shower   Bathroom Toilet: Handicapped height (has both)     Home Equipment: Agricultural consultant (2 wheels);Cane - single Psychiatrist: Reacher        Prior Functioning/Environment Prior Level of Function : Independent/Modified Independent;Driving                        OT Problem List: Decreased range of motion;Pain;Decreased knowledge of use of DME or AE;Impaired balance (sitting and/or standing);Decreased knowledge of precautions      OT Treatment/Interventions: Self-care/ADL training;Therapeutic exercise;Therapeutic activities;DME and/or AE instruction;Patient/family education;Balance training    OT Goals(Current goals can be found in the care plan section)  Acute Rehab OT Goals Patient Stated Goal[place] Goal Formulation: With patient Time For Goal Achievement: 10/20/21 Potential to Achieve Goals: Good ADL Goals Pt Will Perform Lower Body Dressing: with adaptive equipment;sit to/from stand (maintaining back preacutions) Pt Will Transfer to Toilet: with modified independence;ambulating (elevated commode, LRAD for amb, back brace in place, maintaining back precautions) Additional ADL Goal #1: Pt will independently instruct family in back brace mgt Additional ADL Goal #2: Pt will verbalize 100% back precautions and demonstrate understanding 5/5 opportunities during ADL.  OT Frequency: Min 2X/week   Barriers to D/C:            Co-evaluation              AM-PAC OT "6 Clicks" Daily Activity     Outcome Measure Help from another person eating meals?: None Help from another person taking care of personal grooming?: None Help from another person toileting, which includes using toliet, bedpan, or urinal?: A Little Help from another person bathing (including washing, rinsing, drying)?: A Little Help from another person to put on  and taking off regular upper body clothing?: None Help from another person to put on and taking off regular lower body clothing?: A Little 6 Click Score: 21   End of Session    Activity Tolerance: Patient tolerated treatment well Patient left: Other (comment) (seated EOB with PT)  OT Visit Diagnosis: Other abnormalities of gait and mobility (R26.89);Pain Pain - part of body:  (lumbar back)                Time: 2010-0712 OT Time Calculation (min): 31 min Charges:  OT General Charges $OT Visit: 1 Visit OT Evaluation $OT Eval Moderate Complexity: 1 Mod OT Treatments $Self Care/Home Management : 8-22 mins  Arman Filter., MPH, MS, OTR/L ascom [phone] 10/06/21, 9:41 AM

## 2021-10-07 MED ORDER — SIMETHICONE 80 MG PO CHEW
80.0000 mg | CHEWABLE_TABLET | Freq: Four times a day (QID) | ORAL | Status: DC | PRN
  Administered 2021-10-07 – 2021-10-08 (×2): 80 mg via ORAL
  Filled 2021-10-07 (×4): qty 1

## 2021-10-07 NOTE — Anesthesia Postprocedure Evaluation (Signed)
Anesthesia Post Note  Patient: Jane [name]  Procedure(s) Performed: L3-5 DECOMPRESSION L3-S1 POSTERIOR LUMBAR FUSION L5-S1 TRANSFORAMINAL LUMBAR INTERBODY FUSION (TLIF) BONE MARROW ASPIRATION FOR SPINE FUSION ONLY (BMAC) APPLICATION OF WOUND VAC (Back)  Patient location during evaluation: PACU Anesthesia Type: General Level of consciousness: awake and alert Pain management: pain level controlled Vital Signs Assessment: post-procedure vital signs reviewed and stable Respiratory status: spontaneous breathing, nonlabored ventilation, respiratory function stable and patient connected to nasal cannula oxygen Cardiovascular status: blood pressure returned to baseline and stable Postop Assessment: no apparent nausea or vomiting Anesthetic complications: no   No notable events documented.   Last Vitals:  Vitals:   10/07/21 1137 10/07/21 1531  BP: (!) 113/59 (!) 117/55  Pulse: 90 98  Resp: 18 16  Temp: 37.1 C 37.3 C  SpO2: 97% 99%    Last Pain:  Vitals:   10/07/21 1800  TempSrc:   PainSc: 2                  Lenard Simmer

## 2021-10-07 NOTE — Progress Notes (Signed)
Occupational Therapy Treatment Patient Details Name: Jane [name] MRN: [id number] DOB: Jan 02, 1961 Today's Date: 10/07/2021   History of present illness Patient is a  60 year old female with anterolisthesis, pars defect. s/p TLIF L5/S1 on 10/05/21. PMHx includes HTN, asthma, anxiety, hypothyroidism, GERD, and arthritis.   OT comments  Pt seen for OT tx this date. Pt received up in recliner, recently took pain meds, and endorsing 7/10 back pain. Repositioned pillows to help. Pt instructed further in back precautions and how to maintain during ADL/mobility, AE/DME, falls prevention, and home/routines modifications for ADL/mobility. Pt able to recall 3/4 back precautions at start of session, 4/4 at end of session. Pt progressing towards goals, continues to benefit from skilled OT while at the hospital.    Recommendations for follow up therapy are one component of a multi-disciplinary discharge planning process, led by the attending physician.  Recommendations may be updated based on patient status, additional functional criteria and insurance authorization.    Follow Up Recommendations  No OT follow up    Assistance Recommended at Discharge PRN  Equipment Recommendations  Tub/shower seat;BSC    Recommendations for Other Services      Precautions / Restrictions Precautions Precautions: Back;Fall Precaution Booklet Issued: Yes (comment) Precaution Comments: standard back precautions Required Braces or Orthoses: Spinal Brace Spinal Brace: Applied in sitting position;Lumbar corset Restrictions Weight Bearing Restrictions: No       Mobility Bed Mobility               General bed mobility comments: NT up in recliner    Transfers                   General transfer comment: delined 2/2 pain     Balance                                           ADL either performed or assessed with clinical judgement   ADL                                          General ADL Comments: Pt continues to require MIN A for LB ADL 2/2 back precautions and pain. Indep with UB ADL from seated position. Pt reports spouse will be able to assist.     Vision       Perception     Praxis      Cognition Arousal/Alertness: Awake/alert Behavior During Therapy: WFL for tasks assessed/performed Overall Cognitive Status: Within Functional Limits for tasks assessed                                            Exercises Other Exercises Other Exercises: Pt instructed further in back precautions and how to maintain during ADL/mobility, AE/DME, falls prevention, and home/routines modifications for ADL/mobility. Pt able to recall 3/4 back precautions at start of session, 4/4 at end of session.   Shoulder Instructions       General Comments      Pertinent Vitals/ Pain       Pain Assessment: 0-10 Pain Score: 7  Pain Location: lower back Pain Descriptors / Indicators: Sore;Aching Pain Intervention(s): Limited activity within patient's tolerance;Monitored  during session;Premedicated before session;Repositioned  Home Living                                          Prior Functioning/Environment              Frequency  Min 2X/week        Progress Toward Goals  OT Goals(current goals can now be found in the care plan section)     Acute Rehab OT Goals Patient Stated Goal[place] Goal Formulation: With patient Time For Goal Achievement: 10/20/21 Potential to Achieve Goals: Good  Plan Discharge plan remains appropriate;Frequency remains appropriate    Co-evaluation                 AM-PAC OT "6 Clicks" Daily Activity     Outcome Measure   Help from another person eating meals?: None Help from another person taking care of personal grooming?: None Help from another person toileting, which includes using toliet, bedpan, or urinal?: A Little Help from another  person bathing (including washing, rinsing, drying)?: A Little Help from another person to put on and taking off regular upper body clothing?: None Help from another person to put on and taking off regular lower body clothing?: A Little 6 Click Score: 21    End of Session    OT Visit Diagnosis: Other abnormalities of gait and mobility (R26.89);Pain Pain - part of body:  (back)   Activity Tolerance Patient tolerated treatment well   Patient Left in chair;with call bell/phone within reach;with chair alarm set;Other (comment) (back brace on)   Nurse Communication          Time: 1601-0932 OT Time Calculation (min): 18 min  Charges: OT General Charges $OT Visit: 1 Visit OT Treatments $Self Care/Home Management : 8-22 mins  Arman Filter., MPH, MS, OTR/L ascom [phone] 10/07/21, 11:42 AM

## 2021-10-07 NOTE — Progress Notes (Signed)
    Attending Progress Note  History: Jane [name] is a 60 y.o presenting with lumbar spondylolisthesis, stenosis and radiculopathy s/p L3-S1 PSF, L3-5 decompression,  L5- S1 TLIF on 10/06/21.  POD#2: NAEO. Improved BP. Tolerated short walks with PT yesterday.  POD#1: some hypotension overnight, improved with fluid bolus. Pressures continue to be soft. Pt tolerating pain well with current medication regimen.   Physical Exam: Vitals:   10/06/21 2322 10/07/21 0455  BP: (!) 110/57 (!) 117/47  Pulse: 83 86  Resp: 17 16  Temp: 100 F (37.8 C) 100 F (37.8 C)  SpO2: 96% 100%    AA Ox3 CNI  Strength:5/5 throughout BLE  Incision: wound vac in place without complications. HV output: HV #1 (deep): 70 HV #2 (deep): 90 HV # 3 (superficial): 110  Data:  Recent Labs  Lab 10/06/21 0638  NA 138  K 4.5  CL 106  CO2 26  BUN 19  CREATININE 0.68  GLUCOSE 132*  CALCIUM 8.2*    No results for input(s): AST, ALT, ALKPHOS in the last 168 hours.  Invalid input(s): TBILI   Recent Labs  Lab 10/06/21 0638  WBC 15.7*  HGB 10.8*  HCT 34.2*  PLT 255    No results for input(s): APTT, INR in the last 168 hours.       Other tests/results: none   Assessment/Plan:  Jane [name] is 60 y.o female presenting with lumbar spondylolisthesis, stenosis and radiculopathy s/p L3-S1 PSF, L3-5 decompression,  L5- S1 TLIF on 10/06/21.  - mobilize - pain control - hold BP medications for hypotension  - DVT prophylaxis - removed HV #1 on 10/07/21. Continue to monitor HV #2 and #3 for decreased output - PT/OT  Manning Charity PA-C Department of Neurosurgery

## 2021-10-07 NOTE — Progress Notes (Signed)
Physical Therapy Treatment Patient Details Name: Jane [name] MRN: [id number] DOB: 16-Mar-1961 Today's Date: 10/07/2021   History of Present Illness Patient is a  60 year old female with anterolisthesis, pars defect. s/p TLIF L5/S1 on 10/05/21. PMHx includes HTN, asthma, anxiety, hypothyroidism, GERD, and arthritis.    PT Comments    Pt was sitting in recliner upon arriving. She is a little groggy form medicine however willing to participate and motivated throughout. Easily able to stand to RW and ambulate 200 ft with supervision. She will have assistance at home from supportive spouse when DC'd. Author did demonstrate proper application of LSO brace prior to standing. She stated understanding and was able to perform I'ly. Will address stair training this afternoon session and continue to progress pt to PLOF.    Recommendations for follow up therapy are one component of a multi-disciplinary discharge planning process, led by the attending physician.  Recommendations may be updated based on patient status, additional functional criteria and insurance authorization.  Follow Up Recommendations  Home health PT     Assistance Recommended at Discharge Intermittent Supervision/Assistance  Equipment Recommendations  None recommended by PT       Precautions / Restrictions Precautions Precautions: Back;Fall Precaution Booklet Issued: Yes (comment) Precaution Comments: standard back precautions Required Braces or Orthoses: Spinal Brace Spinal Brace: Other (comment);Applied in sitting position (LSO) Restrictions Weight Bearing Restrictions: No     Mobility  Bed Mobility      General bed mobility comments: in recliner pre/post session    Transfers Overall transfer level: Needs assistance Equipment used: Rolling walker (2 wheels) Transfers: Sit to/from Stand Sit to Stand: Min guard;Supervision      General transfer comment: delined 2/2 pain    Ambulation/Gait Ambulation/Gait  assistance: Supervision Gait Distance (Feet): 200 Feet Assistive device: Rolling walker (2 wheels) Gait Pattern/deviations: Step-through pattern Gait velocity: WNL   General Gait Details: no LOB or unsteadiness      Balance Overall balance assessment: Needs assistance Sitting-balance support: No upper extremity supported;Feet supported Sitting balance-Leahy Scale: Good     Standing balance support: Bilateral upper extremity supported;Reliant on assistive device for balance Standing balance-Leahy Scale: Good         Cognition Arousal/Alertness: Awake/alert Behavior During Therapy: WFL for tasks assessed/performed Overall Cognitive Status: Within Functional Limits for tasks assessed           Exercises Other Exercises Other Exercises: Pt instructed further in back precautions and how to maintain during ADL/mobility, AE/DME, falls prevention, and home/routines modifications for ADL/mobility. Pt able to recall 3/4 back precautions at start of session, 4/4 at end of session.        Pertinent Vitals/Pain Pain Assessment: 0-10 Pain Score: 7  Pain Location: lower back Pain Descriptors / Indicators: Sore;Aching Pain Intervention(s): Limited activity within patient's tolerance;Premedicated before session;Monitored during session     PT Goals (current goals can now be found in the care plan section) Acute Rehab PT Goals Patient Stated Goal: to go home Progress towards PT goals: Progressing toward goals    Frequency    BID      PT Plan Current plan remains appropriate       AM-PAC PT "6 Clicks" Mobility   Outcome Measure  Help needed turning from your back to your side while in a flat bed without using bedrails?: A Little Help needed moving from lying on your back to sitting on the side of a flat bed without using bedrails?: A Little Help needed moving to and  from a bed to a chair (including a wheelchair)?: A Little Help needed standing up from a chair using your  arms (e.g., wheelchair or bedside chair)?: A Little Help needed to walk in hospital room?: A Little Help needed climbing 3-5 steps with a railing? : A Little 6 Click Score: 18    End of Session Equipment Utilized During Treatment: Gait belt;Back brace Activity Tolerance: Patient tolerated treatment well Patient left: in chair;with call bell/phone within reach;with chair alarm set Nurse Communication: Mobility status PT Visit Diagnosis: Other abnormalities of gait and mobility (R26.89);Pain     Time: 9169-4503 PT Time Calculation (min) (ACUTE ONLY): 13 min  Charges:  $Gait Training: 8-22 mins                     Jetta Lout PTA 10/07/21, 12:58 PM

## 2021-10-07 NOTE — Progress Notes (Signed)
Physical Therapy Treatment Patient Details Name: Jane [name] MRN: [id number] DOB: Feb 11, 1961 Today's Date: 10/07/2021   History of Present Illness Patient is a  60 year old female with anterolisthesis, pars defect. s/p TLIF L5/S1 on 10/05/21. PMHx includes HTN, asthma, anxiety, hypothyroidism, GERD, and arthritis.    PT Comments    Pt was sitting in recliner upon arriving. Does endorse pain however states its "not too bad right now." She was able to stand from recliner with CGA only and ambulate without LOB or safety concern. LSO brace applied throughout. Session progressed to going up/down step to simulate home entry. Did not have any safety concerns or LOB. She overall is progressing well. Acute PT will continue to follow per POC.    Recommendations for follow up therapy are one component of a multi-disciplinary discharge planning process, led by the attending physician.  Recommendations may be updated based on patient status, additional functional criteria and insurance authorization.  Follow Up Recommendations  Follow physician's recommendations for discharge plan and follow up therapies (pt is progressing well. follow MDs recs for follow up PT)     Assistance Recommended at Discharge PRN  Equipment Recommendations  None recommended by PT       Precautions / Restrictions Precautions Precautions: Back;Fall Precaution Booklet Issued: Yes (comment) Precaution Comments: standard back precautions Required Braces or Orthoses: Spinal Brace Spinal Brace: Other (comment) (LSO) Restrictions Weight Bearing Restrictions: No     Mobility  Bed Mobility    General bed mobility comments: Pt was in recliner pre/post session. did not want to perform in/out of bed. will address tomorrow AM session    Transfers Overall transfer level: Needs assistance Equipment used: Rolling walker (2 wheels) Transfers: Sit to/from Stand Sit to Stand: Supervision          General transfer comment:  delined 2/2 pain    Ambulation/Gait Ambulation/Gait assistance: Supervision Gait Distance (Feet): 100 Feet Assistive device: Rolling walker (2 wheels) Gait Pattern/deviations: Step-through pattern Gait velocity: WNL   General Gait Details: no LOB or unsteadiness   Stairs Stairs: Yes Stairs assistance: Min guard Stair Management: No rails;With walker Number of Stairs: 1 General stair comments: pt was able to ascend/descend a step 3 x without difficulty or LOB      Balance Overall balance assessment: Needs assistance Sitting-balance support: No upper extremity supported;Feet supported Sitting balance-Leahy Scale: Good     Standing balance support: Bilateral upper extremity supported;Reliant on assistive device for balance Standing balance-Leahy Scale: Good         Cognition Arousal/Alertness: Awake/alert Behavior During Therapy: WFL for tasks assessed/performed Overall Cognitive Status: Within Functional Limits for tasks assessed          General Comments: Pt is A and O x 4        Exercises Other Exercises Other Exercises: Pt instructed further in back precautions and how to maintain during ADL/mobility, AE/DME, falls prevention, and home/routines modifications for ADL/mobility. Pt able to recall 3/4 back precautions at start of session, 4/4 at end of session.        Pertinent Vitals/Pain Pain Assessment: 0-10 Pain Score: 6  Pain Location: lower back Pain Descriptors / Indicators: Sore;Aching Pain Intervention(s): Limited activity within patient's tolerance;Monitored during session;Premedicated before session;Repositioned     PT Goals (current goals can now be found in the care plan section) Acute Rehab PT Goals Patient Stated Goal: to go home when I'm safe to go Progress towards PT goals: Progressing toward goals    Frequency  BID      PT Plan Current plan remains appropriate       AM-PAC PT "6 Clicks" Mobility   Outcome Measure  Help  needed turning from your back to your side while in a flat bed without using bedrails?: A Little Help needed moving from lying on your back to sitting on the side of a flat bed without using bedrails?: A Little Help needed moving to and from a bed to a chair (including a wheelchair)?: A Little Help needed standing up from a chair using your arms (e.g., wheelchair or bedside chair)?: A Little Help needed to walk in hospital room?: A Little Help needed climbing 3-5 steps with a railing? : A Little 6 Click Score: 18    End of Session Equipment Utilized During Treatment: Gait belt;Back brace Activity Tolerance: Patient tolerated treatment well Patient left: in chair;with call bell/phone within reach;with chair alarm set Nurse Communication: Mobility status PT Visit Diagnosis: Other abnormalities of gait and mobility (R26.89);Pain     Time: 1500-1516 PT Time Calculation (min) (ACUTE ONLY): 16 min  Charges:  $Gait Training: 8-22 mins                    Jetta Lout PTA 10/07/21, 3:44 PM

## 2021-10-07 NOTE — TOC Progression Note (Addendum)
Transition of Care [facility]) - Progression Note    Patient Details  Name: Jane [name] MRN: [id number] Date of Birth: Feb 25, 1961  [facility] CM/SW [place], RN Phone Number: 10/07/2021, 9:52 AM  Clinical Narrative:    Met with the patient at the bedside, She let me know that she will need a RW and a 3 in 1, I notified [employer], it will be delivered to her room prior to DC She stated that she will not need HH PT, she has transportation with her husband and family, non additional needs        Expected Discharge Plan and Services                                                 Social Determinants of Health (SDOH) Interventions    Readmission Risk Interventions No flowsheet data found.

## 2021-10-08 NOTE — Progress Notes (Signed)
Physical Therapy Treatment Patient Details Name: Jane [name] MRN: [id number] DOB: Apr 11, 1961 Today's Date: 10/08/2021   History of Present Illness Patient is a  60 year old female with anterolisthesis, pars defect. s/p TLIF L5/S1 on 10/05/21. PMHx includes HTN, asthma, anxiety, hypothyroidism, GERD, and arthritis.    PT Comments    Pt was sitting in recliner upon arriving. She is A and O and cooperative and motivated. Was able to independently apply LSO back brace without assistance. She stood to RW and ambulated 200 ft without LOB or safety concern. Demonstrated safe ability to go up/down stairs to simulate home environment. Overall pt is doing well. Continued to reinforce importance of getting up and ambulating frequently. PT recommends following surgeons recs for follow up therapy at DC. Author will return later this afternoon to ambulate pt again.    Recommendations for follow up therapy are one component of a multi-disciplinary discharge planning process, led by the attending physician.  Recommendations may be updated based on patient status, additional functional criteria and insurance authorization.  Follow Up Recommendations  Follow physician's recommendations for discharge plan and follow up therapies (pt is progressing well with mobility, transfers, and gait)     Assistance Recommended at Discharge PRN  Equipment Recommendations  None recommended by PT (pt has recieved personal equipment already)       Precautions / Restrictions Precautions Precautions: Back;Fall Precaution Booklet Issued: Yes (comment) Precaution Comments: standard back precautions Required Braces or Orthoses: Spinal Brace Spinal Brace: Other (comment);Applied in sitting position (LSO) Spinal Brace Comments: pt demonstrated ability to independently place back brace without assistance. did c/o discomfort from drain site but "its not terrible." Restrictions Weight Bearing Restrictions: No     Mobility   Bed Mobility    General bed mobility comments: pt was already in recliner upon arriving. did discuss proper log roll technique for getting in/out of bed. she states understanding    Transfers Overall transfer level: Needs assistance Equipment used: Rolling walker (2 wheels) Transfers: Sit to/from Stand Sit to Stand: Supervision     Ambulation/Gait Ambulation/Gait assistance: Supervision Gait Distance (Feet): 200 Feet Assistive device: Rolling walker (2 wheels) Gait Pattern/deviations: Step-through pattern Gait velocity: WNL   General Gait Details: no LOB or unsteadiness   Stairs Stairs: Yes Stairs assistance: Min guard Stair Management: One rail Left Number of Stairs: 4 General stair comments: pt was able to ascend/descend 4 stair with LUE rail only. no safety concerns with performing stairs    Balance Overall balance assessment: Needs assistance Sitting-balance support: No upper extremity supported;Feet supported Sitting balance-Leahy Scale: Good     Standing balance support: Bilateral upper extremity supported;Reliant on assistive device for balance Standing balance-Leahy Scale: Good        Cognition Arousal/Alertness: Awake/alert Behavior During Therapy: WFL for tasks assessed/performed Overall Cognitive Status: Within Functional Limits for tasks assessed        General Comments: Pt is A and O x 4               Pertinent Vitals/Pain Pain Assessment: 0-10 Pain Score: 4  Pain Location: lower back Pain Descriptors / Indicators: Sore;Aching Pain Intervention(s): Limited activity within patient's tolerance;Monitored during session;Premedicated before session;Repositioned     PT Goals (current goals can now be found in the care plan section) Acute Rehab PT Goals Patient Stated Goal: to go home when I'm safe to go Progress towards PT goals: Progressing toward goals    Frequency    BID  PT Plan Current plan remains appropriate       AM-PAC  PT "6 Clicks" Mobility   Outcome Measure  Help needed turning from your back to your side while in a flat bed without using bedrails?: A Little Help needed moving from lying on your back to sitting on the side of a flat bed without using bedrails?: A Little Help needed moving to and from a bed to a chair (including a wheelchair)?: A Little Help needed standing up from a chair using your arms (e.g., wheelchair or bedside chair)?: A Little Help needed to walk in hospital room?: A Little Help needed climbing 3-5 steps with a railing? : A Little 6 Click Score: 18    End of Session Equipment Utilized During Treatment: Gait belt;Back brace Activity Tolerance: Patient tolerated treatment well Patient left: in chair;with call bell/phone within reach;with chair alarm set Nurse Communication: Mobility status PT Visit Diagnosis: Other abnormalities of gait and mobility (R26.89);Pain     Time: 1025-1045 PT Time Calculation (min) (ACUTE ONLY): 20 min  Charges:  $Gait Training: 8-22 mins                    Jetta Lout PTA 10/08/21, 12:22 PM

## 2021-10-08 NOTE — Progress Notes (Signed)
    Attending Progress Note  History: Jane [name] is a 60 y.o presenting with lumbar spondylolisthesis, stenosis and radiculopathy s/p L3-S1 PSF, L3-5 decompression,  L5- S1 TLIF on 10/06/21.  POD#3: Making improvements with mobility and pain.  POD#2: NAEO. Improved BP. Tolerated short walks with PT yesterday.  POD#1: some hypotension overnight, improved with fluid bolus. Pressures continue to be soft. Pt tolerating pain well with current medication regimen.   Physical Exam: Vitals:   10/07/21 2025 10/08/21 0452  BP: (!) 108/58 (!) 112/55  Pulse: 100 87  Resp: 17 17  Temp: 98.5 F (36.9 C) 97.8 F (36.6 C)  SpO2: 100% 100%    AA Ox3 CNI  Strength:5/5 throughout BLE  Incision: wound vac in place without complications. HV output: HV #1 (deep): OUT HV #2 (deep): 30 HV # 3 (superficial): 10  Data:  Recent Labs  Lab 10/06/21 0638  NA 138  K 4.5  CL 106  CO2 26  BUN 19  CREATININE 0.68  GLUCOSE 132*  CALCIUM 8.2*   No results for input(s): AST, ALT, ALKPHOS in the last 168 hours.  Invalid input(s): TBILI   Recent Labs  Lab 10/06/21 0638  WBC 15.7*  HGB 10.8*  HCT 34.2*  PLT 255   No results for input(s): APTT, INR in the last 168 hours.       Other tests/results: none   Assessment/Plan:  Jane [name] is 60 y.o female presenting with lumbar spondylolisthesis, stenosis and radiculopathy s/p L3-S1 PSF, L3-5 decompression,  L5- S1 TLIF on 10/06/21.  - mobilize - pain control - hold BP medications for hypotension  - DVT prophylaxis - removed HV #1 on 10/07/21. Remove HV#2 on 111/5. Monitor drain #3 and hopefully remove tomorrow.  - PT/OT  Venetia Night MD Department of Neurosurgery

## 2021-10-08 NOTE — Progress Notes (Signed)
Physical Therapy Treatment Patient Details Name: Jane [name] MRN: [id number] DOB: 01/06/1961 Today's Date: 10/08/2021   History of Present Illness Patient is a  60 year old female with anterolisthesis, pars defect. s/p TLIF L5/S1 on 10/05/21. PMHx includes HTN, asthma, anxiety, hypothyroidism, GERD, and arthritis.    PT Comments    Pt was sitting EOB upon arriving. She agrees to session and continues to be cooperative and motivated. She was able to stand and ambulate 2 laps (~ 400')  with RW + LSO brace. No LOB or knee buckling present. Pt states sh continues to feel more and more confident with overall abilities. She did require assistance to progress LEs back into bed after session. Will have supportive spouse at home to assist at DC and is planning to sleep in recliner at first. Overall pt is doing well. Follow Mds recs for follow up therapies. Pt is cleared from an acute PT standpoint for safe DC when cleared by MD.    Recommendations for follow up therapy are one component of a multi-disciplinary discharge planning process, led by the attending physician.  Recommendations may be updated based on patient status, additional functional criteria and insurance authorization.  Follow Up Recommendations  Follow physician's recommendations for discharge plan and follow up therapies     Assistance Recommended at Discharge PRN  Equipment Recommendations  None recommended by PT       Precautions / Restrictions Precautions Precautions: Back;Fall Precaution Booklet Issued: Yes (comment) Precaution Comments: standard back precautions Required Braces or Orthoses: Spinal Brace Spinal Brace: Other (comment);Applied in sitting position (LSO) Restrictions Weight Bearing Restrictions: No     Mobility  Bed Mobility Overal bed mobility: Needs Assistance         Sit to supine: Min assist;Mod assist   General bed mobility comments: pt was sitting EOB upon arriving. She did required min-mod  assist to return to supine after OOB activity. Pt will have spouse's help when DC and is planning to sleep in recliner    Transfers Overall transfer level: Needs assistance Equipment used: Rolling walker (2 wheels) Transfers: Sit to/from Stand Sit to Stand: Supervision           General transfer comment: pt was able to STS without physical assistance.    Ambulation/Gait Ambulation/Gait assistance: Supervision Gait Distance (Feet): 400 Feet Assistive device: Rolling walker (2 wheels) Gait Pattern/deviations: Step-through pattern Gait velocity: WNL   General Gait Details: no LOB or unsteadiness    Balance Overall balance assessment: Modified Independent       Cognition Arousal/Alertness: Awake/alert Behavior During Therapy: WFL for tasks assessed/performed Overall Cognitive Status: Within Functional Limits for tasks assessed      General Comments: Pt is A and O x 4               Pertinent Vitals/Pain Pain Assessment: 0-10 Pain Score: 2  Pain Location: lower back Pain Descriptors / Indicators: Sore;Aching Pain Intervention(s): Limited activity within patient's tolerance;Monitored during session;Premedicated before session;Repositioned     PT Goals (current goals can now be found in the care plan section) Acute Rehab PT Goals Patient Stated Goal: to go home when I'm safe to go Progress towards PT goals: Progressing toward goals    Frequency    BID      PT Plan Current plan remains appropriate       AM-PAC PT "6 Clicks" Mobility   Outcome Measure  Help needed turning from your back to your side while in a flat bed  without using bedrails?: A Little Help needed moving from lying on your back to sitting on the side of a flat bed without using bedrails?: A Little Help needed moving to and from a bed to a chair (including a wheelchair)?: A Little Help needed standing up from a chair using your arms (e.g., wheelchair or bedside chair)?: None Help needed  to walk in hospital room?: None Help needed climbing 3-5 steps with a railing? : None 6 Click Score: 21    End of Session Equipment Utilized During Treatment: Back brace Activity Tolerance: Patient tolerated treatment well Patient left: in bed;with call bell/phone within reach;with bed alarm set Nurse Communication: Mobility status PT Visit Diagnosis: Other abnormalities of gait and mobility (R26.89);Pain     Time: 7026-3785 PT Time Calculation (min) (ACUTE ONLY): 15 min  Charges:  $Gait Training: 8-22 mins                    Jetta Lout PTA 10/08/21, 5:12 PM

## 2021-10-09 MED ORDER — OXYCODONE HCL 5 MG PO TABS: 5.0000 mg | ORAL_TABLET | ORAL | 0 refills | Status: AC | PRN

## 2021-10-09 MED ORDER — POLYETHYLENE GLYCOL 3350 17 G PO PACK: 17.0000 g | PACK | Freq: Every day | ORAL | 0 refills | Status: DC | PRN

## 2021-10-09 MED ORDER — CYCLOBENZAPRINE HCL 5 MG PO TABS
5.0000 mg | ORAL_TABLET | Freq: Three times a day (TID) | ORAL | 0 refills | Status: AC | PRN
Start: 2021-10-09 — End: 2021-10-19

## 2021-10-09 NOTE — Discharge Summary (Signed)
Physician Discharge Summary  Patient ID: Jane [name] MRN: [id number] DOB/AGE: [birth year] 61 y.o.  Admit date: 10/05/2021 Discharge date: 10/09/2021  Admission Diagnoses: Lumbar Stenosis with neurogenic claudication, Pars defect of the lumbar spine, anterolisthesis  Discharge Diagnoses:  Active Problems:   S/P lumbar fusion   Discharged Condition: good  Hospital Course: Jane [name] was admitted for surgical intervention.  She did well with surgery, and progressed with physical therapy and pain control.  This required several days to stabilize her pain and mobilize to the point of being safe for discharge.  By POD4, all drains were removed and she was deemed stable for discharge.  Consults: None  Significant Diagnostic Studies: radiology: X-Ray: Good placement of implants on xrays in surgery  Treatments: surgery: L3-S1 fusion, L5-S1 TLIF, L3-5 decompression  Discharge Exam: Blood pressure (!) 117/46, pulse 91, temperature 98.2 F (36.8 C), resp. rate 18, height 5\' 7"  (1.702 m), weight 104.5 kg, SpO2 98 %. General appearance: alert Doing well CNI 5/5 throughout Incision c/d/i  Disposition: Discharge disposition: 01-Home or Self Care       Discharge Instructions     Incentive spirometry RT   Complete by: As directed       Allergies as of 10/09/2021       Reactions   Celebrex [celecoxib] Swelling, Shortness Of Breath   Swelling. Developed Facial swelling    Atorvastatin Other (See Comments)   myalgia        Medication List     TAKE these medications    clonazePAM 0.5 MG tablet Commonly known as: KLONOPIN Take 0.5-1 tablets (0.25-0.5 mg total) by mouth 2 (two) times daily as needed for anxiety. What changed: how much to take   cyclobenzaprine 5 MG tablet Commonly known as: FLEXERIL Take 1-2 tablets (5-10 mg total) by mouth 3 (three) times daily as needed for up to 10 days for muscle spasms. What changed: how much to take   gabapentin 300 MG  capsule Commonly known as: NEURONTIN Take 300-600 mg by mouth See admin instructions. 300 mg twice daily, 600 mg at bedtime   levothyroxine 112 MCG tablet Commonly known as: SYNTHROID TAKE 1 TABLET BY MOUTH DAILY 30 MINUTES BEFORE BREAKFAST   losartan 100 MG tablet Commonly known as: COZAAR TAKE 1 TABLET BY MOUTH DAILY   montelukast 10 MG tablet Commonly known as: SINGULAIR TAKE 1 TABLET AT BEDTIME   omeprazole 20 MG capsule Commonly known as: PRILOSEC TAKE 1 CAPSULE BY MOUTH TWICE DAILY BEFORE MEALS   oxyCODONE 5 MG immediate release tablet Commonly known as: Oxy IR/ROXICODONE Take 1-2 tablets (5-10 mg total) by mouth every 4 (four) hours as needed for up to 5 days for moderate pain ((score 4 to 6)).   polyethylene glycol 17 g packet Commonly known as: MIRALAX / GLYCOLAX Take 17 g by mouth daily as needed for mild constipation.   Premarin vaginal cream Generic drug: conjugated estrogens Place 1 Applicatorful vaginally daily.   Qbrexza 2.4 % Pads Generic drug: Glycopyrronium Tosylate Apply 1 each topically daily.   venlafaxine XR 150 MG 24 hr capsule Commonly known as: EFFEXOR-XR TAKE 1 CAPSULE BY MOUTH DAILY WITH BREAKFAST.               Durable Medical Equipment  (From admission, onward)           Start     Ordered   10/07/21 0951  For home use only DME Walker rolling  Once       Question Answer  Comment  Walker: With 5 Inch Wheels   Patient needs a walker to treat with the following condition Weakness      10/07/21 0950            Follow-up Information     Susanne Borders, PA Follow up.   Why: already scheduled Contact information: [address] [place] [address] [phone]                 Signed: Venetia Night 10/09/2021, 11:00 AM

## 2021-10-09 NOTE — Progress Notes (Signed)
    Attending Progress Note  History: Jane [name] is a 60 y.o presenting with lumbar spondylolisthesis, stenosis and radiculopathy s/p L3-S1 PSF, L3-5 decompression,  L5- S1 TLIF on 10/06/21.  POD#4: Doing well, but with some pain this AM.  Did not take pain meds overnight.  POD#3: Making improvements with mobility and pain.  POD#2: NAEO. Improved BP. Tolerated short walks with PT yesterday.  POD#1: some hypotension overnight, improved with fluid bolus. Pressures continue to be soft. Pt tolerating pain well with current medication regimen.   Physical Exam: Vitals:   10/09/21 0444 10/09/21 0759  BP: (!) 111/45 (!) 117/46  Pulse: 85 91  Resp: 18 18  Temp: 99.3 F (37.4 C) 98.2 F (36.8 C)  SpO2: 97% 98%    AA Ox3 CNI  Strength:5/5 throughout BLE  Incision: wound vac in place without complications. HV output: HV #1 (deep): OUT HV #2 (deep): OUT HV # 3 (superficial): 5  Data:  Recent Labs  Lab 10/06/21 0638  NA 138  K 4.5  CL 106  CO2 26  BUN 19  CREATININE 0.68  GLUCOSE 132*  CALCIUM 8.2*   No results for input(s): AST, ALT, ALKPHOS in the last 168 hours.  Invalid input(s): TBILI   Recent Labs  Lab 10/06/21 0638  WBC 15.7*  HGB 10.8*  HCT 34.2*  PLT 255   No results for input(s): APTT, INR in the last 168 hours.       Other tests/results: none   Assessment/Plan:  Jane [name] is 60 y.o female presenting with lumbar spondylolisthesis, stenosis and radiculopathy s/p L3-S1 PSF, L3-5 decompression,  L5- S1 TLIF on 10/06/21.  - mobilize - pain control - DVT prophylaxis - PT/OT  Venetia Night MD Department of Neurosurgery

## 2021-10-09 NOTE — Progress Notes (Signed)
Discharge instructions were reviewed with the patient and her husband. All questions were answered. We talked extensively about medications for discharge. PIV's were removed.

## 2021-10-09 NOTE — Discharge Instructions (Signed)
  Your surgeon has performed an operation on your lumbar spine (low back) to relieve pressure on one or more nerves. Many times, patients feel better immediately after surgery and can "overdo it." Even if you feel well, it is important that you follow these activity guidelines. If you do not let your back heal properly from the surgery, you can increase the chance of a disc herniation and/or return of your symptoms. The following are instructions to help in your recovery once you have been discharged from the hospital.  * Do not take anti-inflammatory medications for 3 months after surgery (naproxen [Aleve], ibuprofen [Advil, Motrin], etc.) Tylenol is ok to take.  Activity    No bending, lifting, or twisting ("BLT"). Avoid lifting objects heavier than 10 pounds (gallon milk jug).  Where possible, avoid household activities that involve lifting, bending, pushing, or pulling such as laundry, vacuuming, grocery shopping, and childcare. Try to arrange for help from friends and family for these activities while your back heals.  Increase physical activity slowly as tolerated.  Taking short walks is encouraged, but avoid strenuous exercise. Do not jog, run, bicycle, lift weights, or participate in any other exercises unless specifically allowed by your doctor. Avoid prolonged sitting, including car rides.  Talk to your doctor before resuming sexual activity.  You should not drive until cleared by your doctor.  Until released by your doctor, you should not return to work or school.  You should rest at home and let your body heal.   You may shower.  After showering, lightly dab your incision dry. Do not take a tub bath or go swimming for 3 weeks, or until approved by your doctor at your follow-up appointment.  If you smoke, we strongly recommend that you quit.  Smoking has been proven to interfere with normal healing in your back and will dramatically reduce the success rate of your surgery. Please contact  [facility] (800-QUIT-NOW) and use the resources at www.[facility].com for assistance in stopping smoking.  Surgical Incision   You can remove your dressing either 11/7 or 11/8.  All of your stitches are internal.  Diet            You may return to your usual diet. Be sure to stay hydrated.  When to Contact us  Although your surgery and recovery will likely be uneventful, you may have some residual numbness, aches, and pains in your back and/or legs. This is normal and should improve in the next few weeks.  However, should you experience any of the following, contact us immediately: New numbness or weakness Pain that is progressively getting worse, and is not relieved by your pain medications or rest Bleeding, redness, swelling, pain, or drainage from surgical incision Chills or flu-like symptoms Fever greater than 101.0 F (38.3 C) Problems with bowel or bladder functions Difficulty breathing or shortness of breath Warmth, tenderness, or swelling in your calf  Contact Information During office hours (Monday-Friday 9 am to 5 pm), please call your physician at [phone] After hours and weekends, please call [phone] and speak with the answering service, who will contact the doctor on call.  If that fails, call the [facility] Operator at [phone] and ask for the Neurosurgery Resident On Call  For a life-threatening emergency, call 911

## 2021-10-09 NOTE — Plan of Care (Signed)

## 2021-10-09 NOTE — Progress Notes (Addendum)
Physical Therapy Treatment Patient Details Name: Jane [name] MRN: [id number] DOB: 08/05/1961 Today's Date: 10/09/2021   History of Present Illness Patient is a  60 year old female with anterolisthesis, pars defect. s/p TLIF L5/S1 on 10/05/21. PMHx includes HTN, asthma, anxiety, hypothyroidism, GERD, and arthritis.    PT Comments    Pt OOB and ambulated x 2 laps around unit with RW and slow steady gait with TLSO.  C/O general discomfort today and RN in and requested pain medication.  Overall doing well with no safety concerns.  Seated BLE AROM x 10   Recommendations for follow up therapy are one component of a multi-disciplinary discharge planning process, led by the attending physician.  Recommendations may be updated based on patient status, additional functional criteria and insurance authorization.  Follow Up Recommendations  Follow physician's recommendations for discharge plan and follow up therapies     Assistance Recommended at Discharge PRN  Equipment Recommendations  None recommended by PT    Recommendations for Other Services       Precautions / Restrictions Precautions Precautions: Back;Fall Precaution Booklet Issued: Yes (comment) Precaution Comments: standard back precautions Required Braces or Orthoses: Spinal Brace Spinal Brace: Other (comment);Applied in sitting position (LSO) Restrictions Weight Bearing Restrictions: No     Mobility  Bed Mobility Overal bed mobility: Needs Assistance Bed Mobility: Sidelying to Sit;Rolling Rolling: Supervision Sidelying to sit: Supervision            Transfers Overall transfer level: Needs assistance Equipment used: Rolling walker (2 wheels) Transfers: Sit to/from Stand Sit to Stand: Supervision;Modified independent (Device/Increase time)                Ambulation/Gait Ambulation/Gait assistance: Supervision Gait Distance (Feet): 400 Feet Assistive device: Rolling walker (2 wheels) Gait  Pattern/deviations: Step-through pattern Gait velocity: WNL   General Gait Details: no LOB or unsteadiness   Stairs             Wheelchair Mobility    Modified Rankin (Stroke Patients Only)       Balance Overall balance assessment: Modified Independent                                          Cognition Arousal/Alertness: Awake/alert Behavior During Therapy: WFL for tasks assessed/performed Overall Cognitive Status: Within Functional Limits for tasks assessed                                          Exercises      General Comments        Pertinent Vitals/Pain Pain Assessment: Faces Faces Pain Scale: Hurts a little bit Pain Location: lower back Pain Descriptors / Indicators: Sore;Aching Pain Intervention(s): Limited activity within patient's tolerance;Monitored during session;Patient requesting pain meds-RN notified;Repositioned    Home Living                          Prior Function            PT Goals (current goals can now be found in the care plan section) Progress towards PT goals: Progressing toward goals    Frequency    BID      PT Plan Current plan remains appropriate    Co-evaluation  AM-PAC PT "6 Clicks" Mobility   Outcome Measure  Help needed turning from your back to your side while in a flat bed without using bedrails?: None Help needed moving from lying on your back to sitting on the side of a flat bed without using bedrails?: A Little Help needed moving to and from a bed to a chair (including a wheelchair)?: None Help needed standing up from a chair using your arms (e.g., wheelchair or bedside chair)?: None Help needed to walk in hospital room?: None Help needed climbing 3-5 steps with a railing? : None 6 Click Score: 23    End of Session Equipment Utilized During Treatment: Back brace Activity Tolerance: Patient tolerated treatment well Patient left: in  bed;with call bell/phone within reach;with bed alarm set Nurse Communication: Mobility status PT Visit Diagnosis: Other abnormalities of gait and mobility (R26.89);Pain     Time: 1610-9604 PT Time Calculation (min) (ACUTE ONLY): 15 min  Charges:  $Gait Training: 8-22 mins                    Danielle Dess, PTA 10/09/21, 9:21 AM

## 2021-11-17 DIAGNOSIS — Z981 Arthrodesis status: Secondary | ICD-10-CM | POA: Diagnosis not present

## 2021-11-17 DIAGNOSIS — M4326 Fusion of spine, lumbar region: Secondary | ICD-10-CM | POA: Diagnosis not present

## 2021-11-24 DIAGNOSIS — M539 Dorsopathy, unspecified: Secondary | ICD-10-CM | POA: Diagnosis not present

## 2021-11-24 DIAGNOSIS — M797 Fibromyalgia: Secondary | ICD-10-CM | POA: Diagnosis not present

## 2021-11-24 DIAGNOSIS — M159 Polyosteoarthritis, unspecified: Secondary | ICD-10-CM | POA: Diagnosis not present

## 2021-12-02 ENCOUNTER — Telehealth: Payer: Self-pay | Admitting: Family Medicine

## 2021-12-02 NOTE — Telephone Encounter (Signed)
[facility] faxed refill request for the following medications:   venlafaxine XR (EFFEXOR-XR) 150 MG 24 hr capsule    Please advise.  

## 2021-12-02 NOTE — Telephone Encounter (Signed)
Pt was made aware she needs appointment in order to get this medication refilled. Pt has made appointment with Mardella Layman on 12/08/2021. venlafaxine XR (EFFEXOR-XR) 150 MG 24 hr capsule [

## 2021-12-07 DIAGNOSIS — M5442 Lumbago with sciatica, left side: Secondary | ICD-10-CM | POA: Diagnosis not present

## 2021-12-07 DIAGNOSIS — G8929 Other chronic pain: Secondary | ICD-10-CM | POA: Diagnosis not present

## 2021-12-07 DIAGNOSIS — M5441 Lumbago with sciatica, right side: Secondary | ICD-10-CM | POA: Diagnosis not present

## 2021-12-07 DIAGNOSIS — M6281 Muscle weakness (generalized): Secondary | ICD-10-CM | POA: Diagnosis not present

## 2021-12-08 ENCOUNTER — Ambulatory Visit: Payer: Federal, State, Local not specified - PPO | Admitting: Physician Assistant

## 2021-12-13 ENCOUNTER — Other Ambulatory Visit: Payer: Self-pay | Admitting: Physician Assistant

## 2021-12-13 DIAGNOSIS — F411 Generalized anxiety disorder: Secondary | ICD-10-CM

## 2021-12-13 DIAGNOSIS — R42 Dizziness and giddiness: Secondary | ICD-10-CM | POA: Diagnosis not present

## 2021-12-13 DIAGNOSIS — E559 Vitamin D deficiency, unspecified: Secondary | ICD-10-CM | POA: Diagnosis not present

## 2021-12-13 DIAGNOSIS — G629 Polyneuropathy, unspecified: Secondary | ICD-10-CM | POA: Diagnosis not present

## 2021-12-13 DIAGNOSIS — M797 Fibromyalgia: Secondary | ICD-10-CM

## 2021-12-13 DIAGNOSIS — E538 Deficiency of other specified B group vitamins: Secondary | ICD-10-CM | POA: Diagnosis not present

## 2021-12-13 NOTE — Telephone Encounter (Signed)
Medication Refill - Medication: venlafaxine XR (EFFEXOR-XR) 150 MG 24 hr capsule  Has the patient contacted their pharmacy? Yes.    (Agent: If yes, when and what did the pharmacy advise?) Needed to contact pcp  Preferred Pharmacy (with phone number or street name): [facility] - [place], [place] - [address]  Phone:  [phone] Fax:  [phone] Has the patient been seen for an appointment in the last year OR does the patient have an upcoming appointment? Yes.    Agent: Please be advised that RX refills may take up to 3 business days. We ask that you follow-up with your pharmacy.

## 2021-12-13 NOTE — Telephone Encounter (Signed)
Requested medications are due for refill today.  yes  Requested medications are on the active medications list.  yes  Last refill. 07/12/2021 #30 with 0 refills.  Future visit scheduled.   no  Notes to clinic.  Pt last seen 01/07/2021. More than 3 months overdue for OV. Pt cancelled appt 12/08/2021.    Requested Prescriptions  Pending Prescriptions Disp Refills   venlafaxine XR (EFFEXOR-XR) 150 MG 24 hr capsule 30 capsule 0    Sig: Take 1 capsule (150 mg total) by mouth daily with breakfast.     Psychiatry: Antidepressants - SNRI - desvenlafaxine & venlafaxine Failed - 12/13/2021  6:17 PM      Failed - LDL in normal range and within 360 days    LDL Chol Calc (NIH)  Date Value Ref Range Status  04/05/2020 134 (H) 0 - 99 mg/dL Final          Failed - Total Cholesterol in normal range and within 360 days    Cholesterol, Total  Date Value Ref Range Status  04/05/2020 204 (H) 100 - 199 mg/dL Final          Failed - Triglycerides in normal range and within 360 days    Triglycerides  Date Value Ref Range Status  04/05/2020 186 (H) 0 - 149 mg/dL Final   Triglyceride fasting, serum  Date Value Ref Range Status  02/26/2009 105 mg/dL           Failed - Last BP in normal range    BP Readings from Last 1 Encounters:  10/09/21 (!) 117/46          Failed - Valid encounter within last 6 months    Recent Outpatient Visits           9 months ago Neuropathy   [facility] Mar [place], [place]   11 months ago [facility], Clearnce Sorrel, [place]   1 year ago Myofascial pain syndrome of thoracic spine   [place], [place], [place]   1 year ago Acute right-sided thoracic back pain   [place], [place], [place]   1 year ago Herpes zoster lesion   [place], Vickki Muff, [place]

## 2021-12-15 ENCOUNTER — Other Ambulatory Visit: Payer: Self-pay | Admitting: Student

## 2021-12-15 ENCOUNTER — Other Ambulatory Visit (HOSPITAL_COMMUNITY): Payer: Self-pay | Admitting: Student

## 2021-12-15 DIAGNOSIS — R42 Dizziness and giddiness: Secondary | ICD-10-CM

## 2021-12-20 ENCOUNTER — Other Ambulatory Visit (HOSPITAL_COMMUNITY): Payer: Self-pay | Admitting: Student

## 2021-12-20 ENCOUNTER — Other Ambulatory Visit: Payer: Self-pay | Admitting: Student

## 2021-12-20 DIAGNOSIS — R404 Transient alteration of awareness: Secondary | ICD-10-CM

## 2021-12-22 DIAGNOSIS — Z981 Arthrodesis status: Secondary | ICD-10-CM | POA: Diagnosis not present

## 2021-12-22 DIAGNOSIS — M4324 Fusion of spine, thoracic region: Secondary | ICD-10-CM | POA: Diagnosis not present

## 2021-12-22 DIAGNOSIS — Z9889 Other specified postprocedural states: Secondary | ICD-10-CM | POA: Diagnosis not present

## 2021-12-22 DIAGNOSIS — M6281 Muscle weakness (generalized): Secondary | ICD-10-CM | POA: Diagnosis not present

## 2021-12-22 DIAGNOSIS — M4326 Fusion of spine, lumbar region: Secondary | ICD-10-CM | POA: Diagnosis not present

## 2021-12-26 ENCOUNTER — Other Ambulatory Visit: Payer: Self-pay

## 2021-12-26 ENCOUNTER — Ambulatory Visit
Admission: RE | Admit: 2021-12-26 | Discharge: 2021-12-26 | Disposition: A | Discharge status: Home / Self Care | Payer: Federal, State, Local not specified - PPO | Source: Ambulatory Visit | Attending: Student | Admitting: Student

## 2021-12-26 DIAGNOSIS — R42 Dizziness and giddiness: Secondary | ICD-10-CM | POA: Insufficient documentation

## 2021-12-26 DIAGNOSIS — I6782 Cerebral ischemia: Secondary | ICD-10-CM | POA: Diagnosis not present

## 2021-12-27 ENCOUNTER — Other Ambulatory Visit: Payer: Self-pay

## 2021-12-27 ENCOUNTER — Ambulatory Visit
Admission: RE | Admit: 2021-12-27 | Discharge: 2021-12-27 | Disposition: A | Discharge status: Home / Self Care | Payer: Federal, State, Local not specified - PPO | Source: Ambulatory Visit | Attending: Student | Admitting: Student

## 2021-12-27 DIAGNOSIS — R404 Transient alteration of awareness: Secondary | ICD-10-CM | POA: Diagnosis not present

## 2021-12-29 DIAGNOSIS — M180 Bilateral primary osteoarthritis of first carpometacarpal joints: Secondary | ICD-10-CM | POA: Diagnosis not present

## 2022-01-05 ENCOUNTER — Other Ambulatory Visit: Payer: Self-pay | Admitting: Family Medicine

## 2022-01-05 DIAGNOSIS — R0602 Shortness of breath: Secondary | ICD-10-CM

## 2022-01-05 NOTE — Telephone Encounter (Signed)
Requested medication (s) are due for refill today - yes  Requested medication (s) are on the active medication list -yes  Future visit scheduled -no  Last refill: 06/29/21 #90 1RF  Notes to clinic: Request RF: Patient has not been seen in office by active provider- all visits with providers no longer at practice  Requested Prescriptions  Pending Prescriptions Disp Refills   montelukast (SINGULAIR) 10 MG tablet [Pharmacy Med Name: MONTELUKAST SODIUM 10 MG TAB] 90 tablet 1    Sig: TAKE 1 TABLET AT BEDTIME     Pulmonology:  Leukotriene Inhibitors Passed - 01/05/2022 12:16 PM      Passed - Valid encounter within last 12 months    Recent Outpatient Visits           10 months ago Neuropathy   [facility] [place], Alessandra Bevels, [place]   12 months ago [facility], Alessandra Bevels, [place]   1 year ago Myofascial pain syndrome of thoracic spine   [facility] Joycelyn Man M, [place]   1 year ago Acute right-sided thoracic back pain   [facility] Osvaldo Angst M, [place]   1 year ago Herpes zoster lesion   [facility] Chrismon, Jodell Cipro, PA-C                 Requested Prescriptions  Pending Prescriptions Disp Refills   montelukast (SINGULAIR) 10 MG tablet [Pharmacy Med Name: MONTELUKAST SODIUM 10 MG TAB] 90 tablet 1    Sig: TAKE 1 TABLET AT BEDTIME     Pulmonology:  Leukotriene Inhibitors Passed - 01/05/2022 12:16 PM      Passed - Valid encounter within last 12 months    Recent Outpatient Visits           10 months ago Neuropathy   [facility] [name], [place]   12 months ago [facility], Alessandra Bevels, [place]   1 year ago Myofascial pain syndrome of thoracic spine   [facility] V[place], [place], [place]   1 year ago Acute right-sided thoracic back pain   [facility] [place], Ricki Rodriguez M, [place]   1 year ago  Herpes zoster lesion   [facility] Chrismon, Jodell Cipro, [place]

## 2022-01-13 DIAGNOSIS — M797 Fibromyalgia: Secondary | ICD-10-CM | POA: Diagnosis not present

## 2022-01-13 DIAGNOSIS — F411 Generalized anxiety disorder: Secondary | ICD-10-CM | POA: Diagnosis not present

## 2022-01-13 DIAGNOSIS — E890 Postprocedural hypothyroidism: Secondary | ICD-10-CM | POA: Diagnosis not present

## 2022-01-13 DIAGNOSIS — I10 Essential (primary) hypertension: Secondary | ICD-10-CM | POA: Diagnosis not present

## 2022-01-17 ENCOUNTER — Other Ambulatory Visit: Payer: Self-pay | Admitting: Student

## 2022-01-17 DIAGNOSIS — Z1231 Encounter for screening mammogram for malignant neoplasm of breast: Secondary | ICD-10-CM

## 2022-02-16 DIAGNOSIS — M25552 Pain in left hip: Secondary | ICD-10-CM | POA: Diagnosis not present

## 2022-02-25 DIAGNOSIS — G4733 Obstructive sleep apnea (adult) (pediatric): Secondary | ICD-10-CM | POA: Diagnosis not present

## 2022-03-14 ENCOUNTER — Ambulatory Visit
Admission: RE | Admit: 2022-03-14 | Discharge: 2022-03-14 | Disposition: A | Discharge status: Home / Self Care | Payer: Federal, State, Local not specified - PPO | Source: Ambulatory Visit | Attending: Student | Admitting: Student

## 2022-03-14 DIAGNOSIS — Z1231 Encounter for screening mammogram for malignant neoplasm of breast: Secondary | ICD-10-CM | POA: Diagnosis not present

## 2022-03-27 DIAGNOSIS — I10 Essential (primary) hypertension: Secondary | ICD-10-CM | POA: Diagnosis not present

## 2022-03-27 DIAGNOSIS — G4733 Obstructive sleep apnea (adult) (pediatric): Secondary | ICD-10-CM | POA: Diagnosis not present

## 2022-03-27 DIAGNOSIS — K6289 Other specified diseases of anus and rectum: Secondary | ICD-10-CM | POA: Diagnosis not present

## 2022-04-05 DIAGNOSIS — R7303 Prediabetes: Secondary | ICD-10-CM | POA: Diagnosis not present

## 2022-04-05 DIAGNOSIS — I10 Essential (primary) hypertension: Secondary | ICD-10-CM | POA: Diagnosis not present

## 2022-04-05 DIAGNOSIS — E782 Mixed hyperlipidemia: Secondary | ICD-10-CM | POA: Diagnosis not present

## 2022-04-05 DIAGNOSIS — E890 Postprocedural hypothyroidism: Secondary | ICD-10-CM | POA: Diagnosis not present

## 2022-04-12 DIAGNOSIS — E890 Postprocedural hypothyroidism: Secondary | ICD-10-CM | POA: Diagnosis not present

## 2022-04-12 DIAGNOSIS — Z0000 Encounter for general adult medical examination without abnormal findings: Secondary | ICD-10-CM | POA: Diagnosis not present

## 2022-04-12 DIAGNOSIS — E119 Type 2 diabetes mellitus without complications: Secondary | ICD-10-CM | POA: Diagnosis not present

## 2022-04-12 DIAGNOSIS — F321 Major depressive disorder, single episode, moderate: Secondary | ICD-10-CM | POA: Diagnosis not present

## 2022-04-12 DIAGNOSIS — I10 Essential (primary) hypertension: Secondary | ICD-10-CM | POA: Diagnosis not present

## 2022-04-20 ENCOUNTER — Other Ambulatory Visit: Payer: Self-pay | Admitting: Physician Assistant

## 2022-04-20 DIAGNOSIS — E890 Postprocedural hypothyroidism: Secondary | ICD-10-CM

## 2022-04-26 DIAGNOSIS — K6289 Other specified diseases of anus and rectum: Secondary | ICD-10-CM | POA: Diagnosis not present

## 2022-04-26 DIAGNOSIS — G4733 Obstructive sleep apnea (adult) (pediatric): Secondary | ICD-10-CM | POA: Diagnosis not present

## 2022-04-26 DIAGNOSIS — I10 Essential (primary) hypertension: Secondary | ICD-10-CM | POA: Diagnosis not present

## 2022-05-22 ENCOUNTER — Telehealth: Payer: Self-pay | Admitting: Student

## 2022-05-22 NOTE — Telephone Encounter (Signed)
[facility] faxed refill request for the following medications:    losartan (COZAAR) 100 MG tablet   Please advise  

## 2022-05-23 DIAGNOSIS — M4326 Fusion of spine, lumbar region: Secondary | ICD-10-CM | POA: Diagnosis not present

## 2022-05-23 DIAGNOSIS — Z981 Arthrodesis status: Secondary | ICD-10-CM | POA: Diagnosis not present

## 2022-05-23 DIAGNOSIS — Z9889 Other specified postprocedural states: Secondary | ICD-10-CM | POA: Diagnosis not present

## 2022-05-23 DIAGNOSIS — I700 Atherosclerosis of aorta: Secondary | ICD-10-CM | POA: Diagnosis not present

## 2022-05-23 DIAGNOSIS — M533 Sacrococcygeal disorders, not elsewhere classified: Secondary | ICD-10-CM | POA: Diagnosis not present

## 2022-05-23 NOTE — Telephone Encounter (Signed)
No longer our patient

## 2022-05-24 DIAGNOSIS — F411 Generalized anxiety disorder: Secondary | ICD-10-CM | POA: Diagnosis not present

## 2022-05-24 DIAGNOSIS — M797 Fibromyalgia: Secondary | ICD-10-CM | POA: Diagnosis not present

## 2022-05-24 DIAGNOSIS — F321 Major depressive disorder, single episode, moderate: Secondary | ICD-10-CM | POA: Diagnosis not present

## 2022-05-24 DIAGNOSIS — I10 Essential (primary) hypertension: Secondary | ICD-10-CM | POA: Diagnosis not present

## 2022-05-25 DIAGNOSIS — M5136 Other intervertebral disc degeneration, lumbar region: Secondary | ICD-10-CM | POA: Diagnosis not present

## 2022-05-25 DIAGNOSIS — M797 Fibromyalgia: Secondary | ICD-10-CM | POA: Diagnosis not present

## 2022-05-25 DIAGNOSIS — M159 Polyosteoarthritis, unspecified: Secondary | ICD-10-CM | POA: Diagnosis not present

## 2022-05-27 DIAGNOSIS — I10 Essential (primary) hypertension: Secondary | ICD-10-CM | POA: Diagnosis not present

## 2022-05-27 DIAGNOSIS — G4733 Obstructive sleep apnea (adult) (pediatric): Secondary | ICD-10-CM | POA: Diagnosis not present

## 2022-05-27 DIAGNOSIS — K6289 Other specified diseases of anus and rectum: Secondary | ICD-10-CM | POA: Diagnosis not present

## 2022-06-07 DIAGNOSIS — M6281 Muscle weakness (generalized): Secondary | ICD-10-CM | POA: Diagnosis not present

## 2022-06-13 DIAGNOSIS — M6281 Muscle weakness (generalized): Secondary | ICD-10-CM | POA: Diagnosis not present

## 2022-06-20 DIAGNOSIS — M6281 Muscle weakness (generalized): Secondary | ICD-10-CM | POA: Diagnosis not present

## 2022-06-26 DIAGNOSIS — K6289 Other specified diseases of anus and rectum: Secondary | ICD-10-CM | POA: Diagnosis not present

## 2022-06-26 DIAGNOSIS — G4733 Obstructive sleep apnea (adult) (pediatric): Secondary | ICD-10-CM | POA: Diagnosis not present

## 2022-06-26 DIAGNOSIS — I10 Essential (primary) hypertension: Secondary | ICD-10-CM | POA: Diagnosis not present

## 2022-06-27 DIAGNOSIS — M6281 Muscle weakness (generalized): Secondary | ICD-10-CM | POA: Diagnosis not present

## 2022-07-03 DIAGNOSIS — M6281 Muscle weakness (generalized): Secondary | ICD-10-CM | POA: Diagnosis not present

## 2022-07-14 ENCOUNTER — Other Ambulatory Visit: Payer: Self-pay | Admitting: Physical Medicine & Rehabilitation

## 2022-07-14 DIAGNOSIS — G8929 Other chronic pain: Secondary | ICD-10-CM

## 2022-07-14 DIAGNOSIS — M48062 Spinal stenosis, lumbar region with neurogenic claudication: Secondary | ICD-10-CM | POA: Diagnosis not present

## 2022-07-14 DIAGNOSIS — M5412 Radiculopathy, cervical region: Secondary | ICD-10-CM | POA: Diagnosis not present

## 2022-07-14 DIAGNOSIS — M5442 Lumbago with sciatica, left side: Secondary | ICD-10-CM | POA: Diagnosis not present

## 2022-07-14 DIAGNOSIS — M542 Cervicalgia: Secondary | ICD-10-CM | POA: Diagnosis not present

## 2022-07-17 DIAGNOSIS — M6281 Muscle weakness (generalized): Secondary | ICD-10-CM | POA: Diagnosis not present

## 2022-07-18 ENCOUNTER — Ambulatory Visit
Admission: RE | Admit: 2022-07-18 | Discharge: 2022-07-18 | Disposition: A | Discharge status: Home / Self Care | Payer: Federal, State, Local not specified - PPO | Source: Ambulatory Visit | Attending: Physical Medicine & Rehabilitation | Admitting: Physical Medicine & Rehabilitation

## 2022-07-18 DIAGNOSIS — G8929 Other chronic pain: Secondary | ICD-10-CM | POA: Diagnosis not present

## 2022-07-18 DIAGNOSIS — M5442 Lumbago with sciatica, left side: Secondary | ICD-10-CM | POA: Insufficient documentation

## 2022-07-18 DIAGNOSIS — M5441 Lumbago with sciatica, right side: Secondary | ICD-10-CM | POA: Insufficient documentation

## 2022-07-18 DIAGNOSIS — M5126 Other intervertebral disc displacement, lumbar region: Secondary | ICD-10-CM | POA: Diagnosis not present

## 2022-07-24 DIAGNOSIS — M25552 Pain in left hip: Secondary | ICD-10-CM | POA: Diagnosis not present

## 2022-07-24 DIAGNOSIS — E119 Type 2 diabetes mellitus without complications: Secondary | ICD-10-CM | POA: Diagnosis not present

## 2022-07-24 DIAGNOSIS — G8929 Other chronic pain: Secondary | ICD-10-CM | POA: Diagnosis not present

## 2022-07-27 DIAGNOSIS — I10 Essential (primary) hypertension: Secondary | ICD-10-CM | POA: Diagnosis not present

## 2022-07-27 DIAGNOSIS — G4733 Obstructive sleep apnea (adult) (pediatric): Secondary | ICD-10-CM | POA: Diagnosis not present

## 2022-07-27 DIAGNOSIS — K6289 Other specified diseases of anus and rectum: Secondary | ICD-10-CM | POA: Diagnosis not present

## 2022-08-01 DIAGNOSIS — I10 Essential (primary) hypertension: Secondary | ICD-10-CM | POA: Diagnosis not present

## 2022-08-01 DIAGNOSIS — G4733 Obstructive sleep apnea (adult) (pediatric): Secondary | ICD-10-CM | POA: Diagnosis not present

## 2022-08-02 ENCOUNTER — Ambulatory Visit: Payer: Self-pay

## 2022-08-02 NOTE — Patient Outreach (Signed)
  Care Coordination   Initial Visit Note   08/02/2022 Name: Jane [name] MRN: [id number] DOB: June 13, 1961  Jane [name] is a 61 y.o. year old female who sees Carren Rang, [place] for primary care. I spoke with  Tanija H Yurick by phone today.  What matters to the patients health and wellness today?  Adequate control of her back pain and fibromyalgia     Goals Addressed             This Visit's Progress    RNCM: Effective Management of Pain       Care Coordination Interventions: 08-02-2022: Rates her pain level at a 5 today on a scale of 0-10. She states it is worse when she is out doing errands and "running around". Reflective listening and support given.  Reviewed provider established plan for pain management. Patient is going to have an injection in a week and a half. She had back surgery in November of 2022 and did have some relief but not like she felt she would. She is working with her pcp and providers to help her back, hip, and fibromyalgia pain. Education and support given Discussed importance of adherence to all scheduled medical appointments. Has upcoming appointment with specialist on 08-24-2022 Counseled on the importance of reporting any/all new or changed pain symptoms or management strategies to pain management provider Advised patient to report to care team affect of pain on daily activities. The patient states if she bends over her pain level is worse. She is trying to be more aware of her activity and prevent exacerbations of her pain Discussed use of relaxation techniques and/or diversional activities to assist with pain reduction (distraction, imagery, relaxation, massage, acupressure, TENS, heat, and cold application. Has a heating pad but does not like to use long as she is hot natured and it causes a lot of issues. Reviewed with patient prescribed pharmacological and nonpharmacological pain relief strategies. Review and education Advised patient to discuss  unresolved pain, changes in level or intensity of pain  with provider Screening for signs and symptoms of depression related to chronic disease state  Assessed social determinant of health barriers 08-02-2022: The patient agrees to work with [facility] and agrees to outreach calls. Knows to call the [facility] for changes or needs before the next outreach           SDOH assessments and interventions completed:  Yes  SDOH Interventions Today    Flowsheet Row Most Recent Value  SDOH Interventions   Food Insecurity Interventions Intervention Not Indicated  Financial Strain Interventions Intervention Not Indicated  Housing Interventions Intervention Not Indicated  Stress Interventions Other (Comment)  [stressed over the back pain and discomfort she is having, states a little anxious]  Transportation Interventions Intervention Not Indicated        Care Coordination Interventions Activated:  Yes  Care Coordination Interventions:  Yes, provided   Follow up plan: Follow up call scheduled for 10-19-2022 at 130 pm    Encounter Outcome:  Pt. Visit Completed   Alto Denver RN, MSN, CCM Community Care Coordinator [facility]  [facility] Mobile: [phone]

## 2022-08-02 NOTE — Patient Instructions (Signed)
Visit Information  Thank you for taking time to visit with me today. Please don't hesitate to contact me if I can be of assistance to you.   Following are the goals we discussed today:   Goals Addressed             This Visit's Progress    RNCM: Effective Management of Pain       Care Coordination Interventions: 08-02-2022: Rates her pain level at a 5 today on a scale of 0-10. She states it is worse when she is out doing errands and "running around". Reflective listening and support given.  Reviewed provider established plan for pain management. Patient is going to have an injection in a week and a half. She had back surgery in November of 2022 and did have some relief but not like she felt she would. She is working with her pcp and providers to help her back, hip, and fibromyalgia pain. Education and support given Discussed importance of adherence to all scheduled medical appointments. Has upcoming appointment with specialist on 08-24-2022 Counseled on the importance of reporting any/all new or changed pain symptoms or management strategies to pain management provider Advised patient to report to care team affect of pain on daily activities. The patient states if she bends over her pain level is worse. She is trying to be more aware of her activity and prevent exacerbations of her pain Discussed use of relaxation techniques and/or diversional activities to assist with pain reduction (distraction, imagery, relaxation, massage, acupressure, TENS, heat, and cold application. Has a heating pad but does not like to use long as she is hot natured and it causes a lot of issues. Reviewed with patient prescribed pharmacological and nonpharmacological pain relief strategies. Review and education Advised patient to discuss unresolved pain, changes in level or intensity of pain  with provider Screening for signs and symptoms of depression related to chronic disease state  Assessed social determinant of  health barriers 08-02-2022: The patient agrees to work with [facility] and agrees to outreach calls. Knows to call the [facility] for changes or needs before the next outreach           Our next appointment is by telephone on 10-19-2022 at 130 pm  Please call the care guide team at [phone] if you need to cancel or reschedule your appointment.   If you are experiencing a Mental Health or Behavioral Health Crisis or need someone to talk to, please call the Suicide and Crisis Lifeline: 988 call the [place] National Suicide Prevention Lifeline: (443) 327-3112 or TTY: 616-858-8944 TTY 717-507-4079) to talk to a trained counselor call 1-800-273-TALK (toll free, 24 hour hotline)  Patient verbalizes understanding of instructions and care plan provided today and agrees to view in MyChart. Active MyChart status and patient understanding of how to access instructions and care plan via MyChart confirmed with patient.     Telephone follow up appointment with care management team member scheduled for: 10-19-2022 at 130 pm  Alto Denver RN, MSN, CCM Community Care Coordinator [facility]  [facility] Mobile: [phone]     Mindfulness-Based Stress Reduction Mindfulness-based stress reduction (MBSR) is a program that helps people learn to practice mindfulness. Mindfulness is the practice of consciously paying attention to the present moment. MBSR focuses on developing self-awareness, which lets you respond to life stress without judgment or negative feelings. It can be learned and practiced through techniques such as education, breathing exercises, meditation, and yoga. MBSR includes several mindfulness techniques in one  program. MBSR works best when you understand the treatment, are willing to try new things, and can commit to spending time practicing what you learn. MBSR training may include learning about: How your feelings, thoughts, and reactions affect your body. New ways to respond to things  that cause negative thoughts to start (triggers). How to notice your thoughts and let go of them. Practicing awareness of everyday things that you normally do without thinking. The techniques and goals of different types of meditation. What are the benefits of MBSR? MBSR can have many benefits, which include helping you to: Develop self-awareness. This means knowing and understanding yourself. Learn skills and attitudes that help you to take part in your own health care. Learn new ways to care for yourself. Be more accepting about how things are, and let things go. Be less judgmental and approach things with an open mind. Be patient with yourself and trust yourself more. MBSR has also been shown to: Reduce negative emotions, such as sadness, overwhelm, and worry. Improve memory and focus. Change how you sense and react to pain. Boost your body's ability to fight infections. Help you connect better with other people. Improve your sense of well-being. How to practice mindfulness To do a basic awareness exercise: Find a comfortable place to sit. Pay attention to the present moment. Notice your thoughts, feelings, and surroundings just as they are. Avoid judging yourself, your feelings, or your surroundings. Make note of any judgment that comes up and let it go. Your mind may wander, and that is okay. Make note of when your thoughts drift, and return your attention to the present moment. To do basic mindfulness meditation: Find a comfortable place to sit. This may include a stable chair or a firm floor cushion. Sit upright with your back straight. Let your arms fall next to your sides, with your hands resting on your legs. If you are sitting in a chair, rest your feet flat on the floor. If you are sitting on a cushion, cross your legs in front of you. Keep your head in a neutral position with your chin dropped slightly. Relax your jaw and rest the tip of your tongue on the roof of your  mouth. Drop your gaze to the floor or close your eyes. Breathe normally and pay attention to your breath. Feel the air moving in and out of your nose. Feel your belly expanding and relaxing with each breath. Your mind may wander, and that is okay. Make note of when your thoughts drift, and return your attention to your breath. Avoid judging yourself, your feelings, or your surroundings. Make note of any judgment or feelings that come up, let them go, and bring your attention back to your breath. When you are ready, lift your gaze or open your eyes. Pay attention to how your body feels after the meditation. Follow these instructions at home:  Find a local in-person or online MBSR program. Set aside some time regularly for mindfulness practice. Practice every day if you can. Even 10 minutes of practice is helpful. Find a mindfulness practice that works best for you. This may include one or more of the following: Meditation. This involves focusing your mind on a certain thought or activity. Breathing awareness exercises. These help you to stay present by focusing on your breath. Body scan. For this practice, you lie down and pay attention to each part of your body from head to toe. You can identify tension and soreness and consciously relax parts of  your body. Yoga. Yoga involves stretching and breathing, and it can improve your ability to move and be flexible. It can also help you to test your body's limits, which can help you release stress. Mindful eating. This way of eating involves focusing on the taste, texture, color, and smell of each bite of food. This slows down eating and helps you feel full sooner. For this reason, it can be an important part of a weight loss plan. Find a podcast or recording that provides guidance for breathing awareness, body scan, or meditation exercises. You can listen to these any time when you have a free moment to rest without distractions. Follow your treatment plan  as told by your health care provider. This may include taking regular medicines and making changes to your diet or lifestyle as recommended. Where to find more information You can find more information about MBSR from: Your health care provider. Community-based meditation centers or programs. Programs offered near you. Summary Mindfulness-based stress reduction (MBSR) is a program that teaches you how to consciously pay attention to the present moment. It is used to help you deal better with daily stress, feelings, and pain. MBSR focuses on developing self-awareness, which allows you to respond to life stress without judgment or negative feelings. MBSR programs may involve learning different mindfulness practices, such as breathing exercises, meditation, yoga, body scan, or mindful eating. Find a mindfulness practice that works best for you, and set aside time for it on a regular basis. This information is not intended to replace advice given to you by your health care provider. Make sure you discuss any questions you have with your health care provider. Document Revised: 06/30/2021 Document Reviewed: 06/30/2021 Elsevier Patient Education  2023 [employer] Inc.   Chronic Back Pain When back pain lasts longer than 3 months, it is called chronic back pain. Pain may get worse at certain times (flare-ups). There are things you can do at home to manage your pain. Follow these instructions at home: Pay attention to any changes in your symptoms. Take these actions to help with your pain: Managing pain and stiffness     If told, put ice on the painful area. Your doctor may tell you to use ice for 24-48 hours after the flare-up starts. To do this: Put ice in a plastic bag. Place a towel between your skin and the bag. Leave the ice on for 20 minutes, 2-3 times a day. If told, put heat on the painful area. Do this as often as told by your doctor. Use the heat source that your doctor recommends, such as  a moist heat pack or a heating pad. Place a towel between your skin and the heat source. Leave the heat on for 20-30 minutes. Take off the heat if your skin turns bright red. This is especially important if you are unable to feel pain, heat, or cold. You may have a greater risk of getting burned. Soak in a warm bath. This can help relieve pain. Activity  Avoid bending and other activities that make pain worse. When standing: Keep your upper back and neck straight. Keep your shoulders pulled back. Avoid slouching. When sitting: Keep your back straight. Relax your shoulders. Do not round your shoulders or pull them backward. Do not sit or stand in one place for long periods of time. Take short rest breaks during the day. Lying down or standing is usually better than sitting. Resting can help relieve pain. When sitting or lying down for a  long time, do some mild activity or stretching. This will help to prevent stiffness and pain. Get regular exercise. Ask your doctor what activities are safe for you. Do not lift anything that is heavier than 10 lb (4.5 kg) or the limit that you are told, until your doctor says that it is safe. To prevent injury when you lift things: Bend your knees. Keep the weight close to your body. Avoid twisting. Sleep on a firm mattress. Try lying on your side with your knees slightly bent. If you lie on your back, put a pillow under your knees. Medicines Treatment may include medicines for pain and swelling taken by mouth or put on the skin, prescription pain medicine, or muscle relaxants. Take over-the-counter and prescription medicines only as told by your doctor. Ask your doctor if the medicine prescribed to you: Requires you to avoid driving or using machinery. Can cause trouble pooping (constipation). You may need to take these actions to prevent or treat trouble pooping: Drink enough fluid to keep your pee (urine) pale yellow. Take over-the-counter or  prescription medicines. Eat foods that are high in fiber. These include beans, whole grains, and fresh fruits and vegetables. Limit foods that are high in fat and sugars. These include fried or sweet foods. General instructions Do not use any products that contain nicotine or tobacco, such as cigarettes, e-cigarettes, and chewing tobacco. If you need help quitting, ask your doctor. Keep all follow-up visits as told by your doctor. This is important. Contact a doctor if: Your pain does not get better with rest or medicine. Your pain gets worse, or you have new pain. You have a high fever. You lose weight very quickly. You have trouble doing your normal activities. Get help right away if: One or both of your legs or feet feel weak. One or both of your legs or feet lose feeling (have numbness). You have trouble controlling when you poop (have a bowel movement) or pee (urinate). You have bad back pain and: You feel like you may vomit (nauseous), or you vomit. You have pain in your belly (abdomen). You have shortness of breath. You faint. Summary When back pain lasts longer than 3 months, it is called chronic back pain. Pain may get worse at certain times (flare-ups). Use ice and heat as told by your doctor. Your doctor may tell you to use ice after flare-ups. This information is not intended to replace advice given to you by your health care provider. Make sure you discuss any questions you have with your health care provider. Document Revised: 12/31/2019 Document Reviewed: 12/31/2019 Elsevier Patient Education  2023 Elsevier Inc.  Myofascial Pain Syndrome and Fibromyalgia Myofascial pain syndrome and fibromyalgia are both pain disorders. You may feel this pain mainly in your muscles. Myofascial pain syndrome: Always has tender points in the muscles that will cause pain when pressed (trigger points). The pain may come and go. Usually affects your neck, upper back, and shoulder areas.  The pain often moves into your arms and hands. Fibromyalgia: Has muscle pains and tenderness that come and go. Is often associated with tiredness (fatigue) and sleep problems. Has trigger points. Tends to be long-lasting (chronic), but is not life-threatening. Fibromyalgia and myofascial pain syndrome are not the same. However, they often occur together. If you have both conditions, each can make the other worse. Both are common and can cause enough pain and fatigue to make day-to-day activities difficult. Both can be hard to diagnose because their symptoms are  common in many other conditions. What are the causes? The exact causes of these conditions are not known. What increases the risk? You are more likely to develop either of these conditions if: You have a family history of the condition. You are female. You have certain triggers, such as: Spine disorders. An injury (trauma) or other physical stressors. Being under a lot of stress. Medical conditions such as osteoarthritis, rheumatoid arthritis, or lupus. What are the signs or symptoms? Fibromyalgia The main symptom of fibromyalgia is widespread pain and tenderness in your muscles. Pain is sometimes described as stabbing, shooting, or burning. You may also have: Tingling or numbness. Sleep problems and fatigue. Problems with attention and concentration (fibro fog). Other symptoms may include: Bowel and bladder problems. Headaches. Vision problems. Sensitivity to odors and noises. Depression or mood changes. Painful menstrual periods (dysmenorrhea). Dry skin or eyes. These symptoms can vary over time. Myofascial pain syndrome Symptoms of myofascial pain syndrome include: Tight, ropy bands of muscle. Uncomfortable sensations in muscle areas. These may include aching, cramping, burning, numbness, tingling, and weakness. Difficulty moving certain parts of the body freely (poor range of motion). How is this diagnosed? This  condition may be diagnosed by your symptoms and medical history. You will also have a physical exam. In general: Fibromyalgia is diagnosed if you have pain, fatigue, and other symptoms for more than 3 months, and symptoms cannot be explained by another condition. Myofascial pain syndrome is diagnosed if you have trigger points in your muscles, and those trigger points are tender and cause pain elsewhere in your body (referred pain). How is this treated? Treatment for these conditions depends on the type that you have. For fibromyalgia a healthy lifestyle is the most important treatment including aerobic and strength exercises. Different types of medicines are used to help treat pain and include: NSAIDs. Medicines for treating depression. Medicines that help control seizures. Medicines that relax the muscles. Treatment for myofascial pain syndrome includes: Pain medicines, such as NSAIDs. Cooling and stretching of muscles. Massage therapy with myofascial release technique. Trigger point injections. Treating these conditions often requires a team of health care providers. These may include: Your primary care provider. A physical therapist. Complementary health care providers, such as massage therapists or acupuncturists. A psychiatrist for cognitive behavioral therapy. Follow these instructions at home: Medicines Take over-the-counter and prescription medicines only as told by your health care provider. Ask your health care provider if the medicine prescribed to you: Requires you to avoid driving or using machinery. Can cause constipation. You may need to take these actions to prevent or treat constipation: Drink enough fluid to keep your urine pale yellow. Take over-the-counter or prescription medicines. Eat foods that are high in fiber, such as beans, whole grains, and fresh fruits and vegetables. Limit foods that are high in fat and processed sugars, such as fried or sweet  foods. Lifestyle  Do exercises as told by your health care provider or physical therapist. Practice relaxation techniques to control your stress. You may want to try: Biofeedback. Visual imagery. Hypnosis. Muscle relaxation. Yoga. Meditation. Maintain a healthy lifestyle. This includes eating a healthy diet and getting enough sleep. Do not use any products that contain nicotine or tobacco. These products include cigarettes, chewing tobacco, and vaping devices, such as e-cigarettes. If you need help quitting, ask your health care provider. General instructions Talk to your health care provider about complementary treatments, such as acupuncture or massage. Do not do activities that stress or strain your muscles. This  includes repetitive motions and heavy lifting. Keep all follow-up visits. This is important. Where to find support Consider joining a support group with others who are diagnosed with this condition. National Fibromyalgia Association: www.fmaware.org Where to find more information American Chronic Pain Association: www.theacpa.org Contact a health care provider if: You have new symptoms. Your symptoms get worse or your pain is severe. You have side effects from your medicines. You have trouble sleeping. Your condition is causing depression or anxiety. Get help right away if: You have thoughts of hurting yourself or others. Get help right awayif you feel like you may hurt yourself or others, or have thoughts about taking your own life. Go to your nearest emergency room or: Call 911. Call the National Suicide Prevention Lifeline at (347)875-6523 or 988. This is open 24 hours a day. Text the Crisis Text Line at 346-479-9810. Summary Myofascial pain syndrome and fibromyalgia are pain disorders. Myofascial pain syndrome has tender points in the muscles that will cause pain when pressed (trigger points). Fibromyalgia also has muscle pains and tenderness that come and go, but this  condition is often associated with fatigue and sleep disturbances. Fibromyalgia and myofascial pain syndrome are not the same but often occur together, causing pain and fatigue that make day-to-day activities difficult. Follow your health care provider's instructions for taking medicines and maintaining a healthy lifestyle. This information is not intended to replace advice given to you by your health care provider. Make sure you discuss any questions you have with your health care provider. Document Revised: 10/21/2021 Document Reviewed: 10/21/2021 Elsevier Patient Education  2023 [employer].

## 2022-08-08 DIAGNOSIS — M461 Sacroiliitis, not elsewhere classified: Secondary | ICD-10-CM | POA: Diagnosis not present

## 2022-08-08 DIAGNOSIS — M542 Cervicalgia: Secondary | ICD-10-CM | POA: Diagnosis not present

## 2022-08-08 DIAGNOSIS — M5442 Lumbago with sciatica, left side: Secondary | ICD-10-CM | POA: Diagnosis not present

## 2022-08-08 DIAGNOSIS — M25552 Pain in left hip: Secondary | ICD-10-CM | POA: Diagnosis not present

## 2022-08-22 DIAGNOSIS — F321 Major depressive disorder, single episode, moderate: Secondary | ICD-10-CM | POA: Diagnosis not present

## 2022-08-22 DIAGNOSIS — Z23 Encounter for immunization: Secondary | ICD-10-CM | POA: Diagnosis not present

## 2022-08-22 DIAGNOSIS — I10 Essential (primary) hypertension: Secondary | ICD-10-CM | POA: Diagnosis not present

## 2022-08-22 DIAGNOSIS — F411 Generalized anxiety disorder: Secondary | ICD-10-CM | POA: Diagnosis not present

## 2022-08-22 DIAGNOSIS — M797 Fibromyalgia: Secondary | ICD-10-CM | POA: Diagnosis not present

## 2022-08-22 DIAGNOSIS — E782 Mixed hyperlipidemia: Secondary | ICD-10-CM | POA: Diagnosis not present

## 2022-08-22 DIAGNOSIS — E119 Type 2 diabetes mellitus without complications: Secondary | ICD-10-CM | POA: Diagnosis not present

## 2022-08-23 ENCOUNTER — Other Ambulatory Visit: Payer: Self-pay

## 2022-08-23 DIAGNOSIS — Z981 Arthrodesis status: Secondary | ICD-10-CM

## 2022-08-23 DIAGNOSIS — M533 Sacrococcygeal disorders, not elsewhere classified: Secondary | ICD-10-CM

## 2022-08-23 DIAGNOSIS — M461 Sacroiliitis, not elsewhere classified: Secondary | ICD-10-CM | POA: Diagnosis not present

## 2022-08-24 ENCOUNTER — Ambulatory Visit: Payer: Federal, State, Local not specified - PPO | Admitting: Neurosurgery

## 2022-08-27 DIAGNOSIS — I10 Essential (primary) hypertension: Secondary | ICD-10-CM | POA: Diagnosis not present

## 2022-08-27 DIAGNOSIS — K6289 Other specified diseases of anus and rectum: Secondary | ICD-10-CM | POA: Diagnosis not present

## 2022-08-27 DIAGNOSIS — G4733 Obstructive sleep apnea (adult) (pediatric): Secondary | ICD-10-CM | POA: Diagnosis not present

## 2022-09-01 DIAGNOSIS — I10 Essential (primary) hypertension: Secondary | ICD-10-CM | POA: Diagnosis not present

## 2022-09-01 DIAGNOSIS — G4733 Obstructive sleep apnea (adult) (pediatric): Secondary | ICD-10-CM | POA: Diagnosis not present

## 2022-09-07 ENCOUNTER — Ambulatory Visit: Payer: Federal, State, Local not specified - PPO | Admitting: Neurosurgery

## 2022-09-13 ENCOUNTER — Ambulatory Visit
Admission: RE | Admit: 2022-09-13 | Discharge: 2022-09-13 | Disposition: A | Discharge status: Home / Self Care | Payer: Federal, State, Local not specified - PPO | Source: Ambulatory Visit | Attending: Neurosurgery | Admitting: Neurosurgery

## 2022-09-13 ENCOUNTER — Other Ambulatory Visit
Admission: RE | Admit: 2022-09-13 | Discharge: 2022-09-13 | Disposition: A | Discharge status: Home / Self Care | Payer: Federal, State, Local not specified - PPO | Source: Home / Self Care | Attending: Neurosurgery | Admitting: Neurosurgery

## 2022-09-13 DIAGNOSIS — M545 Low back pain, unspecified: Secondary | ICD-10-CM | POA: Diagnosis not present

## 2022-09-13 DIAGNOSIS — M533 Sacrococcygeal disorders, not elsewhere classified: Secondary | ICD-10-CM | POA: Insufficient documentation

## 2022-09-13 DIAGNOSIS — M461 Sacroiliitis, not elsewhere classified: Secondary | ICD-10-CM | POA: Diagnosis not present

## 2022-09-13 DIAGNOSIS — M5136 Other intervertebral disc degeneration, lumbar region: Secondary | ICD-10-CM | POA: Diagnosis not present

## 2022-09-13 DIAGNOSIS — Z981 Arthrodesis status: Secondary | ICD-10-CM | POA: Diagnosis not present

## 2022-09-14 ENCOUNTER — Ambulatory Visit: Payer: Federal, State, Local not specified - PPO | Admitting: Neurosurgery

## 2022-09-14 ENCOUNTER — Encounter: Payer: Self-pay | Admitting: Neurosurgery

## 2022-09-14 VITALS — BP 134/80 | Ht 67.0 in | Wt 201.4 lb

## 2022-09-14 DIAGNOSIS — M4310 Spondylolisthesis, site unspecified: Secondary | ICD-10-CM

## 2022-09-14 DIAGNOSIS — M4306 Spondylolysis, lumbar region: Secondary | ICD-10-CM | POA: Diagnosis not present

## 2022-09-14 DIAGNOSIS — Z981 Arthrodesis status: Secondary | ICD-10-CM | POA: Diagnosis not present

## 2022-09-14 DIAGNOSIS — M48062 Spinal stenosis, lumbar region with neurogenic claudication: Secondary | ICD-10-CM

## 2022-09-14 DIAGNOSIS — Z09 Encounter for follow-up examination after completed treatment for conditions other than malignant neoplasm: Secondary | ICD-10-CM

## 2022-09-14 NOTE — Progress Notes (Signed)
DOS: 10/05/21 L3-5 decompression, L5-S1 TLIF, L3-S1 fusion with Dr. Izora Ribas   HISTORY OF PRESENT ILLNESS: Jane [name] is status post lumbar decompression and fusion.  She has some back discomfort and some pain around her right hip.  Overall, she is much better than she was prior to surgery.    PHYSICAL EXAMINATION:  Vitals:   09/14/22 1317  BP: 134/80    General: Patient is well developed, well nourished, calm, collected, and in no apparent distress.  NEUROLOGICAL:  General: In no acute distress.  Awake, alert, oriented to person, place, and time. Pupils equal round and reactive to light. Facial tone is symmetric.   Strength:  Side Iliopsoas Quads Hamstring PF DF EHL  R 5 5 5 5 5 5   L 5 5 5 5 5 5    Ambulating well with the assistance of a cane Incision c/d/i  ROS (Neurologic): Negative except as noted above  IMAGING: No complications noted on imaging today  ASSESSMENT/PLAN:  Jane [name] is doing well after lumbar decompression and fusion.    She will continue exercises and nonoperative management of her pain for now.  I think she is doing much better than prior to surgery.  I will see her back on an as-needed basis if her pain worsens.  Meade Maw MD, [facility] Department of Neurosurgery

## 2022-09-26 DIAGNOSIS — G4733 Obstructive sleep apnea (adult) (pediatric): Secondary | ICD-10-CM | POA: Diagnosis not present

## 2022-09-26 DIAGNOSIS — K6289 Other specified diseases of anus and rectum: Secondary | ICD-10-CM | POA: Diagnosis not present

## 2022-09-26 DIAGNOSIS — I10 Essential (primary) hypertension: Secondary | ICD-10-CM | POA: Diagnosis not present

## 2022-10-01 DIAGNOSIS — I10 Essential (primary) hypertension: Secondary | ICD-10-CM | POA: Diagnosis not present

## 2022-10-01 DIAGNOSIS — G4733 Obstructive sleep apnea (adult) (pediatric): Secondary | ICD-10-CM | POA: Diagnosis not present

## 2022-10-02 DIAGNOSIS — E119 Type 2 diabetes mellitus without complications: Secondary | ICD-10-CM | POA: Diagnosis not present

## 2022-10-02 DIAGNOSIS — R5383 Other fatigue: Secondary | ICD-10-CM | POA: Diagnosis not present

## 2022-10-02 DIAGNOSIS — R0789 Other chest pain: Secondary | ICD-10-CM | POA: Diagnosis not present

## 2022-10-02 DIAGNOSIS — U071 COVID-19: Secondary | ICD-10-CM | POA: Diagnosis not present

## 2022-10-19 ENCOUNTER — Encounter: Payer: Federal, State, Local not specified - PPO | Admitting: *Deleted

## 2022-11-20 DIAGNOSIS — R197 Diarrhea, unspecified: Secondary | ICD-10-CM | POA: Diagnosis not present

## 2022-11-20 DIAGNOSIS — R634 Abnormal weight loss: Secondary | ICD-10-CM | POA: Diagnosis not present

## 2022-11-20 DIAGNOSIS — E119 Type 2 diabetes mellitus without complications: Secondary | ICD-10-CM | POA: Diagnosis not present

## 2022-11-20 DIAGNOSIS — T50905A Adverse effect of unspecified drugs, medicaments and biological substances, initial encounter: Secondary | ICD-10-CM | POA: Diagnosis not present

## 2022-11-26 DIAGNOSIS — I10 Essential (primary) hypertension: Secondary | ICD-10-CM | POA: Diagnosis not present

## 2022-11-26 DIAGNOSIS — K6289 Other specified diseases of anus and rectum: Secondary | ICD-10-CM | POA: Diagnosis not present

## 2022-11-26 DIAGNOSIS — G4733 Obstructive sleep apnea (adult) (pediatric): Secondary | ICD-10-CM | POA: Diagnosis not present

## 2022-11-30 DIAGNOSIS — R748 Abnormal levels of other serum enzymes: Secondary | ICD-10-CM | POA: Diagnosis not present

## 2022-12-22 ENCOUNTER — Other Ambulatory Visit: Payer: Self-pay | Admitting: Orthopedic Surgery

## 2022-12-27 ENCOUNTER — Encounter
Admission: RE | Admit: 2022-12-27 | Discharge: 2022-12-27 | Disposition: A | Discharge status: Home / Self Care | Payer: Federal, State, Local not specified - PPO | Source: Ambulatory Visit | Attending: Orthopedic Surgery | Admitting: Orthopedic Surgery

## 2022-12-27 VITALS — BP 132/66 | HR 81 | Resp 16 | Ht 67.0 in | Wt 195.8 lb

## 2022-12-27 DIAGNOSIS — Z01818 Encounter for other preprocedural examination: Secondary | ICD-10-CM | POA: Diagnosis not present

## 2022-12-27 DIAGNOSIS — Z01810 Encounter for preprocedural cardiovascular examination: Secondary | ICD-10-CM

## 2022-12-27 HISTORY — DX: Obstructive sleep apnea (adult) (pediatric): G47.33

## 2022-12-27 HISTORY — DX: Anemia, unspecified: D64.9

## 2022-12-27 HISTORY — DX: Obesity, unspecified: E66.9

## 2022-12-27 HISTORY — DX: Type 2 diabetes mellitus without complications: E11.9

## 2022-12-27 LAB — URINALYSIS, ROUTINE W REFLEX MICROSCOPIC
Bilirubin Urine: NEGATIVE
Glucose, UA: NEGATIVE mg/dL
Hgb urine dipstick: NEGATIVE
Ketones, ur: 5 mg/dL — AB
Leukocytes,Ua: NEGATIVE
Nitrite: NEGATIVE
Protein, ur: NEGATIVE mg/dL
Specific Gravity, Urine: 1.031 — ABNORMAL HIGH (ref 1.005–1.030)
pH: 5 (ref 5.0–8.0)

## 2022-12-27 LAB — TYPE AND SCREEN
ABO/RH(D): O POS
Antibody Screen: NEGATIVE

## 2022-12-27 LAB — SURGICAL PCR SCREEN
MRSA, PCR: NEGATIVE
Staphylococcus aureus: NEGATIVE

## 2022-12-27 NOTE — Patient Instructions (Addendum)
Your procedure is scheduled on:01-08-23 Monday Report to the Registration Desk on the 1st floor of the [facility].Then proceed to the 2nd floor Surgery Desk To find out your arrival time, please call [phone] between 1PM - 3PM on:01-05-23 Friday If your arrival time is 6:00 am, do not arrive prior to that time as the [facility] entrance doors do not open until 6:00 am.  REMEMBER: Instructions that are not followed completely may result in serious medical risk, up to and including death; or upon the discretion of your surgeon and anesthesiologist your surgery may need to be rescheduled.  Do not eat food after midnight the night before surgery.  No gum chewing, lozengers or hard candies.  You may however, drink CLEAR liquids up to 2 hours before you are scheduled to arrive for your surgery. Do not drink anything within 2 hours of your scheduled arrival time.  Clear liquids include: - water  - apple juice without pulp - gatorade (not RED colors) - black coffee or tea (Do NOT add milk or creamers to the coffee or tea) Do NOT drink anything that is not on this list.  In addition, your doctor has ordered for you to drink the provided  Ensure Pre-Surgery Clear Carbohydrate Drink  Drinking this carbohydrate drink up to two hours before surgery helps to reduce insulin resistance and improve patient outcomes. Please complete drinking 2 hours prior to scheduled arrival time.  TAKE THESE MEDICATIONS THE MORNING OF SURGERY WITH A SIP OF WATER: -levothyroxine (SYNTHROID)  -omeprazole (PRILOSEC)-take one the night before surgery and one the morning of surgery -venlafaxine XR (EFFEXOR-XR)  -You may clonazePAM (KLONOPIN) if needed for anxiety if needed  Stop your Ozempic 7 days prior to surgery-Last dose will be on 12-29-22 Friday-You may resume After surgery  One week prior to surgery: Stop Anti-inflammatories (NSAIDS) such as meloxicam (MOBIC) , Advil, Aleve, Ibuprofen, Motrin, Naproxen,  Naprosyn and Aspirin based products such as Excedrin, Goodys Powder, BC Powder.You may however, take Tylenol if needed for pain up until the day of surgery.  Stop ANY OVER THE COUNTER supplements/vitamins 7 days prior to surgery   No Alcohol for 24 hours before or after surgery.  No Smoking including e-cigarettes for 24 hours prior to surgery.  No chewable tobacco products for at least 6 hours prior to surgery.  No nicotine patches on the day of surgery.  Do not use any "recreational" drugs for at least a week prior to your surgery.  Please be advised that the combination of cocaine and anesthesia may have negative outcomes, up to and including death. If you test positive for cocaine, your surgery will be cancelled.  On the morning of surgery brush your teeth with toothpaste and water, you may rinse your mouth with mouthwash if you wish. Do not swallow any toothpaste or mouthwash.  Use CHG Soap as directed on instruction sheet.  Bring your C-Pap machine to the hospital  Do not wear jewelry, make-up, hairpins, clips or nail polish.  Do not wear lotions, powders, or perfumes.   Do not shave body from the neck down 48 hours prior to surgery just in case you cut yourself which could leave a site for infection.  Also, freshly shaved skin may become irritated if using the CHG soap.  Contact lenses, hearing aids and dentures may not be worn into surgery.  Do not bring valuables to the hospital. [facility] is not responsible for any missing/lost belongings or valuables.   Notify your  doctor if there is any change in your medical condition (cold, fever, infection).  Wear comfortable clothing (specific to your surgery type) to the hospital.  After surgery, you can help prevent lung complications by doing breathing exercises.  Take deep breaths and cough every 1-2 hours. Your doctor may order a device called an Incentive Spirometer to help you take deep breaths. When coughing or sneezing,  hold a pillow firmly against your incision with both hands. This is called "splinting." Doing this helps protect your incision. It also decreases belly discomfort.  If you are being admitted to the hospital overnight, leave your suitcase in the car. After surgery it may be brought to your room.  If you are being discharged the day of surgery, you will not be allowed to drive home. You will need a responsible adult (18 years or older) to drive you home and stay with you that night.   If you are taking public transportation, you will need to have a responsible adult (18 years or older) with you. Please confirm with your physician that it is acceptable to use public transportation.   Please call the [facility] at [phone] if you have any questions about these instructions.  Surgery Visitation Policy:  Patients undergoing a surgery or procedure may have two family members or support persons with them as long as the person is not COVID-19 positive or experiencing its symptoms.   Inpatient Visitation:    Visiting hours are 7 a.m. to 8 p.m. Up to four visitors are allowed at one time in a patient room. The visitors may rotate out with other people during the day. One designated support person (adult) may remain overnight.  Due to an increase in RSV and influenza rates and associated hospitalizations, children ages 46 and under will not be able to visit patients in [facility]. Masks continue to be strongly recommended.   How to Use an Incentive Spirometer An incentive spirometer is a tool that measures how well you are filling your lungs with each breath. Learning to take long, deep breaths using this tool can help you keep your lungs clear and active. This may help to reverse or lessen your chance of developing breathing (pulmonary) problems, especially infection. You may be asked to use a spirometer: After a surgery. If you have a lung problem or a history of  smoking. After a long period of time when you have been unable to move or be active. If the spirometer includes an indicator to show the highest number that you have reached, your health care provider or respiratory therapist will help you set a goal. Keep a log of your progress as told by your health care provider. What are the risks? Breathing too quickly may cause dizziness or cause you to pass out. Take your time so you do not get dizzy or light-headed. If you are in pain, you may need to take pain medicine before doing incentive spirometry. It is harder to take a deep breath if you are having pain. How to use your incentive spirometer  Sit up on the edge of your bed or on a chair. Hold the incentive spirometer so that it is in an upright position. Before you use the spirometer, breathe out normally. Place the mouthpiece in your mouth. Make sure your lips are closed tightly around it. Breathe in slowly and as deeply as you can through your mouth, causing the piston or the ball to rise toward the top of  the chamber. Hold your breath for 3-5 seconds, or for as long as possible. If the spirometer includes a coach indicator, use this to guide you in breathing. Slow down your breathing if the indicator goes above the marked areas. Remove the mouthpiece from your mouth and breathe out normally. The piston or ball will return to the bottom of the chamber. Rest for a few seconds, then repeat the steps 10 or more times. Take your time and take a few normal breaths between deep breaths so that you do not get dizzy or light-headed. Do this every 1-2 hours when you are awake. If the spirometer includes a goal marker to show the highest number you have reached (best effort), use this as a goal to work toward during each repetition. After each set of 10 deep breaths, cough a few times. This will help to make sure that your lungs are clear. If you have an incision on your chest or abdomen from surgery,  place a pillow or a rolled-up towel firmly against the incision when you cough. This can help to reduce pain while taking deep breaths and coughing. General tips When you are able to get out of bed: Walk around often. Continue to take deep breaths and cough in order to clear your lungs. Keep using the incentive spirometer until your health care provider says it is okay to stop using it. If you have been in the hospital, you may be told to keep using the spirometer at home. Contact a health care provider if: You are having difficulty using the spirometer. You have trouble using the spirometer as often as instructed. Your pain medicine is not giving enough relief for you to use the spirometer as told. You have a fever. Get help right away if: You develop shortness of breath. You develop a cough with bloody mucus from the lungs. You have fluid or blood coming from an incision site after you cough. Summary An incentive spirometer is a tool that can help you learn to take long, deep breaths to keep your lungs clear and active. You may be asked to use a spirometer after a surgery, if you have a lung problem or a history of smoking, or if you have been inactive for a long period of time. Use your incentive spirometer as instructed every 1-2 hours while you are awake. If you have an incision on your chest or abdomen, place a pillow or a rolled-up towel firmly against your incision when you cough. This will help to reduce pain. Get help right away if you have shortness of breath, you cough up bloody mucus, or blood comes from your incision when you cough. This information is not intended to replace advice given to you by your health care provider. Make sure you discuss any questions you have with your health care provider. Document Revised: 02/09/2020 Document Reviewed: 02/09/2020 Elsevier Patient Education  2023 [employer].

## 2022-12-29 ENCOUNTER — Inpatient Hospital Stay: Admission: RE | Admit: 2022-12-29 | Payer: Federal, State, Local not specified - PPO | Source: Ambulatory Visit

## 2023-01-02 NOTE — Progress Notes (Signed)
  Perioperative Services Pre-Admission/Anesthesia Testing    Date: 01/02/23  Name: Jane [name] MRN:   [id number]  Re: GLP-1 clearance and provider recommendations   Planned Surgical Procedure(s):    Case: [id number] Date/Time: 01/08/23 1025   Procedure: TOTAL HIP ARTHROPLASTY ANTERIOR APPROACH (Left: Hip)   Anesthesia type: Choice   Pre-op diagnosis: Osteoarthritis of left hip, unspecified osteoarthritis type M16.12   Location: [facility] OR ROOM 02 / [place] ORS FOR AN[facility]   Surgeons: Steffanie Rainwater, MD   Clinical Notes:  Patient is scheduled for the above procedure with the indicated provider/surgeon. In review of her medication reconciliation it was noted that patient is on a prescribed GLP-1 medication. Per guidelines issued by the American Society of Anesthesiologists (ASA), it is recommended that these medications be held for 7 days prior to the patient undergoing any type of elective surgical procedure. The patient is taking the following GLP-1 medication:  [x]  SEMAGLUTIDE   []  EXENATIDE  []  LIRAGLUTIDE   []  LIXISENATIDE  []  DULAGLUTIDE     []  OTHER GLP-1 medication: _______________  Reached out to prescribing provider Eulas Post, PA-C) to make them aware of the guidelines from anesthesia. Given that this patient takes the prescribed GLP-1 medication for her  diabetes diagnosis, rather than for weight loss, recommendations from the prescribing provider were solicited. Prescribing provider made aware of the following so that informed decision/POC can be developed for this patient that may be taking medications belonging to these drug classes:  Oral GLP-1 medications will be held 1 day prior to surgery.  Injectable GLP-1 medications will be held 7 days prior to surgery.  Metformin is routinely held 48 hours prior to surgery due to renal concerns, potential need for contrasted imaging perioperatively, and the potential for tissue hypoxia leading to drug induced lactic  acidosis.  All SGLT2i medications are held 72 hours prior to surgery as they can be associated with the increased potential for developing euglycemic diabetic ketoacidosis (EDKA).   Impression and Plan:  Jane [name] is on a prescribed GLP-1 medication, which induces the known side effect of decreased gastric emptying. Efforts are bring made to mitigate the risk of perioperative hyperglycemic events, as elevated blood glucose levels have been found to contribute to intra/postoperative complications. Additionally, hyperglycemic extremes can potentially necessitate the postponing of a patient's elective case in order to better optimize perioperative glycemic control, again with the aforementioned guidelines in place. With this in mind, recommendations have been sought from the prescribing provider, who has cleared patient to proceed with holding the prescribed GLP-1 as per the guidelines from the ASA.   Provider recommending: no further recommendations received from the prescribing provider.  Copy of signed clearance and recommendations placed on patient's chart for inclusion in their medical record and for review by the surgical/anesthetic team on the day of her procedure.   Honor Loh, MSN, APRN, FNP-C, CEN [facility]  Peri-operative Services Nurse Practitioner Phone: [phone] 01/02/23 2:09 PM  NOTE: This note has been prepared using Dragon dictation software. Despite my best ability to proofread, there is always the potential that unintentional transcriptional errors may still occur from this process.

## 2023-01-08 ENCOUNTER — Encounter: Payer: Self-pay | Admitting: Orthopedic Surgery

## 2023-01-08 ENCOUNTER — Ambulatory Visit: Payer: Federal, State, Local not specified - PPO

## 2023-01-08 ENCOUNTER — Other Ambulatory Visit: Payer: Self-pay

## 2023-01-08 ENCOUNTER — Encounter
Admission: RE | Disposition: A | Discharge status: Home Health | Payer: Self-pay | Source: Home / Self Care | Attending: Orthopedic Surgery

## 2023-01-08 ENCOUNTER — Ambulatory Visit: Payer: Federal, State, Local not specified - PPO | Admitting: Urgent Care

## 2023-01-08 ENCOUNTER — Observation Stay
Admission: RE | Admit: 2023-01-08 | Discharge: 2023-01-09 | Disposition: A | Discharge status: Home Health | Payer: Federal, State, Local not specified - PPO | Attending: Orthopedic Surgery | Admitting: Orthopedic Surgery

## 2023-01-08 DIAGNOSIS — J45909 Unspecified asthma, uncomplicated: Secondary | ICD-10-CM | POA: Diagnosis not present

## 2023-01-08 DIAGNOSIS — Z79899 Other long term (current) drug therapy: Secondary | ICD-10-CM | POA: Insufficient documentation

## 2023-01-08 DIAGNOSIS — E119 Type 2 diabetes mellitus without complications: Secondary | ICD-10-CM | POA: Insufficient documentation

## 2023-01-08 DIAGNOSIS — M1612 Unilateral primary osteoarthritis, left hip: Principal | ICD-10-CM | POA: Insufficient documentation

## 2023-01-08 DIAGNOSIS — I10 Essential (primary) hypertension: Secondary | ICD-10-CM | POA: Insufficient documentation

## 2023-01-08 DIAGNOSIS — Z96653 Presence of artificial knee joint, bilateral: Secondary | ICD-10-CM | POA: Insufficient documentation

## 2023-01-08 DIAGNOSIS — Z01818 Encounter for other preprocedural examination: Secondary | ICD-10-CM

## 2023-01-08 DIAGNOSIS — E039 Hypothyroidism, unspecified: Secondary | ICD-10-CM | POA: Insufficient documentation

## 2023-01-08 HISTORY — PX: TOTAL HIP ARTHROPLASTY: SHX124

## 2023-01-08 LAB — GLUCOSE, CAPILLARY
Glucose-Capillary: 66 mg/dL — ABNORMAL LOW (ref 70–99)
Glucose-Capillary: 116 mg/dL — ABNORMAL HIGH (ref 70–99)
Glucose-Capillary: 137 mg/dL — ABNORMAL HIGH (ref 70–99)

## 2023-01-08 SURGERY — ARTHROPLASTY, HIP, TOTAL, ANTERIOR APPROACH
Anesthesia: General | Site: Hip | Laterality: Left

## 2023-01-08 MED ORDER — ROSUVASTATIN CALCIUM 20 MG PO TABS
10.0000 mg | ORAL_TABLET | Freq: Every evening | ORAL | Status: DC
  Administered 2023-01-08: 10 mg via ORAL

## 2023-01-08 MED ORDER — MIDAZOLAM HCL 2 MG/2ML IJ SOLN
INTRAMUSCULAR | Status: DC | PRN
  Administered 2023-01-08: 2 mg via INTRAVENOUS

## 2023-01-08 MED ORDER — PANTOPRAZOLE SODIUM 40 MG PO TBEC
40.0000 mg | DELAYED_RELEASE_TABLET | Freq: Every day | ORAL | Status: DC
  Administered 2023-01-08 – 2023-01-09 (×2): 40 mg via ORAL

## 2023-01-08 MED ORDER — ENOXAPARIN SODIUM 40 MG/0.4ML IJ SOSY: 40.0000 mg | PREFILLED_SYRINGE | INTRAMUSCULAR | Status: DC

## 2023-01-08 MED ORDER — PHENOL 1.4 % MT LIQD: 1.0000 | OROMUCOSAL | Status: DC | PRN

## 2023-01-08 MED ORDER — EPHEDRINE 5 MG/ML INJ
INTRAVENOUS | Status: AC
  Filled 2023-01-08: qty 5

## 2023-01-08 MED ORDER — PHENYLEPHRINE HCL (PRESSORS) 10 MG/ML IV SOLN
INTRAVENOUS | Status: DC | PRN
  Administered 2023-01-08: 80 ug via INTRAVENOUS
  Administered 2023-01-08 (×5): 160 ug via INTRAVENOUS
  Administered 2023-01-08: 120 ug via INTRAVENOUS

## 2023-01-08 MED ORDER — FENTANYL CITRATE (PF) 100 MCG/2ML IJ SOLN
INTRAMUSCULAR | Status: DC | PRN
  Administered 2023-01-08 (×2): 50 ug via INTRAVENOUS

## 2023-01-08 MED ORDER — STERILE WATER FOR IRRIGATION IR SOLN
Status: DC | PRN
  Administered 2023-01-08: 1000 mL

## 2023-01-08 MED ORDER — ONDANSETRON HCL 4 MG/2ML IJ SOLN
INTRAMUSCULAR | Status: AC
  Filled 2023-01-08: qty 2

## 2023-01-08 MED ORDER — PROPOFOL 10 MG/ML IV BOLUS
INTRAVENOUS | Status: AC
  Filled 2023-01-08: qty 20

## 2023-01-08 MED ORDER — CHLORHEXIDINE GLUCONATE 0.12 % MT SOLN
OROMUCOSAL | Status: AC
  Administered 2023-01-08: 15 mL via OROMUCOSAL
  Filled 2023-01-08: qty 15

## 2023-01-08 MED ORDER — MONTELUKAST SODIUM 10 MG PO TABS
10.0000 mg | ORAL_TABLET | Freq: Every day | ORAL | Status: DC
  Administered 2023-01-08: 10 mg via ORAL
  Filled 2023-01-08: qty 1

## 2023-01-08 MED ORDER — FENTANYL CITRATE (PF) 100 MCG/2ML IJ SOLN
INTRAMUSCULAR | Status: AC
  Administered 2023-01-08: 25 ug via INTRAVENOUS
  Filled 2023-01-08: qty 2

## 2023-01-08 MED ORDER — SODIUM CHLORIDE 0.9 % IV SOLN: INTRAVENOUS | Status: DC

## 2023-01-08 MED ORDER — VENLAFAXINE HCL ER 150 MG PO CP24
150.0000 mg | ORAL_CAPSULE | Freq: Every day | ORAL | Status: DC
  Administered 2023-01-09: 150 mg via ORAL
  Filled 2023-01-08: qty 1

## 2023-01-08 MED ORDER — DEXAMETHASONE SODIUM PHOSPHATE 10 MG/ML IJ SOLN
8.0000 mg | Freq: Once | INTRAMUSCULAR | Status: AC
  Administered 2023-01-08: 8 mg via INTRAVENOUS

## 2023-01-08 MED ORDER — PROPOFOL 10 MG/ML IV BOLUS
INTRAVENOUS | Status: DC | PRN
  Administered 2023-01-08: 50 mg via INTRAVENOUS
  Administered 2023-01-08: 125 ug/kg/min via INTRAVENOUS
  Administered 2023-01-08: 150 mg via INTRAVENOUS

## 2023-01-08 MED ORDER — PANTOPRAZOLE SODIUM 40 MG PO TBEC
DELAYED_RELEASE_TABLET | ORAL | Status: AC
  Filled 2023-01-08: qty 1

## 2023-01-08 MED ORDER — IRRISEPT - 450ML BOTTLE WITH 0.05% CHG IN STERILE WATER, USP 99.95% OPTIME
TOPICAL | Status: DC | PRN
  Administered 2023-01-08: 450 mL

## 2023-01-08 MED ORDER — SODIUM CHLORIDE 0.9 % IR SOLN
Status: DC | PRN
  Administered 2023-01-08: 100 mL

## 2023-01-08 MED ORDER — DOCUSATE SODIUM 100 MG PO CAPS
100.0000 mg | ORAL_CAPSULE | Freq: Two times a day (BID) | ORAL | Status: DC
  Administered 2023-01-08 – 2023-01-09 (×2): 100 mg via ORAL

## 2023-01-08 MED ORDER — OXYCODONE HCL 5 MG PO TABS
ORAL_TABLET | ORAL | Status: AC
  Administered 2023-01-08: 5 mg via ORAL
  Filled 2023-01-08: qty 1

## 2023-01-08 MED ORDER — KETOROLAC TROMETHAMINE 15 MG/ML IJ SOLN
INTRAMUSCULAR | Status: AC
  Administered 2023-01-08: 7.5 mg via INTRAVENOUS
  Filled 2023-01-08: qty 1

## 2023-01-08 MED ORDER — MENTHOL 3 MG MT LOZG: 1.0000 | LOZENGE | OROMUCOSAL | Status: DC | PRN

## 2023-01-08 MED ORDER — HYDROMORPHONE HCL 1 MG/ML IJ SOLN
INTRAMUSCULAR | Status: AC
  Filled 2023-01-08: qty 1

## 2023-01-08 MED ORDER — LEVOTHYROXINE SODIUM 112 MCG PO TABS: 112.0000 ug | ORAL_TABLET | Freq: Every day | ORAL | Status: DC

## 2023-01-08 MED ORDER — MIDAZOLAM HCL 2 MG/2ML IJ SOLN
INTRAMUSCULAR | Status: AC
  Filled 2023-01-08: qty 2

## 2023-01-08 MED ORDER — EPHEDRINE SULFATE (PRESSORS) 50 MG/ML IJ SOLN
INTRAMUSCULAR | Status: DC | PRN
  Administered 2023-01-08 (×2): 5 mg via INTRAVENOUS
  Administered 2023-01-08: 10 mg via INTRAVENOUS
  Administered 2023-01-08: 5 mg via INTRAVENOUS

## 2023-01-08 MED ORDER — PROPOFOL 1000 MG/100ML IV EMUL
INTRAVENOUS | Status: AC
  Filled 2023-01-08: qty 100

## 2023-01-08 MED ORDER — DOCUSATE SODIUM 100 MG PO CAPS
ORAL_CAPSULE | ORAL | Status: AC
  Filled 2023-01-08: qty 1

## 2023-01-08 MED ORDER — ONDANSETRON HCL 4 MG/2ML IJ SOLN: 4.0000 mg | Freq: Once | INTRAMUSCULAR | Status: DC | PRN

## 2023-01-08 MED ORDER — ACETAMINOPHEN 10 MG/ML IV SOLN
INTRAVENOUS | Status: AC
  Filled 2023-01-08: qty 100

## 2023-01-08 MED ORDER — LOSARTAN POTASSIUM 50 MG PO TABS
ORAL_TABLET | ORAL | Status: AC
  Filled 2023-01-08: qty 2

## 2023-01-08 MED ORDER — MORPHINE SULFATE (PF) 4 MG/ML IV SOLN: 0.5000 mg | INTRAVENOUS | Status: DC | PRN

## 2023-01-08 MED ORDER — ONDANSETRON HCL 4 MG/2ML IJ SOLN
INTRAMUSCULAR | Status: DC | PRN
  Administered 2023-01-08: 4 mg via INTRAVENOUS

## 2023-01-08 MED ORDER — LACTATED RINGERS IV SOLN: INTRAVENOUS | Status: DC | PRN

## 2023-01-08 MED ORDER — ROCURONIUM BROMIDE 100 MG/10ML IV SOLN
INTRAVENOUS | Status: DC | PRN
  Administered 2023-01-08 (×2): 20 mg via INTRAVENOUS
  Administered 2023-01-08: 50 mg via INTRAVENOUS

## 2023-01-08 MED ORDER — ACETAMINOPHEN 500 MG PO TABS
1000.0000 mg | ORAL_TABLET | Freq: Three times a day (TID) | ORAL | Status: DC
  Administered 2023-01-08 – 2023-01-09 (×2): 1000 mg via ORAL

## 2023-01-08 MED ORDER — LIDOCAINE HCL (CARDIAC) PF 100 MG/5ML IV SOSY
PREFILLED_SYRINGE | INTRAVENOUS | Status: DC | PRN
  Administered 2023-01-08: 100 mg via INTRAVENOUS

## 2023-01-08 MED ORDER — BUPIVACAINE LIPOSOME 1.3 % IJ SUSP
INTRAMUSCULAR | Status: AC
  Filled 2023-01-08: qty 20

## 2023-01-08 MED ORDER — KETAMINE HCL 10 MG/ML IJ SOLN
INTRAMUSCULAR | Status: DC | PRN
  Administered 2023-01-08: 20 mg via INTRAVENOUS
  Administered 2023-01-08: 10 mg via INTRAVENOUS

## 2023-01-08 MED ORDER — CEFAZOLIN SODIUM-DEXTROSE 2-4 GM/100ML-% IV SOLN
INTRAVENOUS | Status: AC
  Filled 2023-01-08: qty 100

## 2023-01-08 MED ORDER — METOCLOPRAMIDE HCL 5 MG/ML IJ SOLN: 5.0000 mg | Freq: Three times a day (TID) | INTRAMUSCULAR | Status: DC | PRN

## 2023-01-08 MED ORDER — CHLORHEXIDINE GLUCONATE 0.12 % MT SOLN: 15.0000 mL | Freq: Once | OROMUCOSAL | Status: AC

## 2023-01-08 MED ORDER — ONDANSETRON HCL 4 MG/2ML IJ SOLN: 4.0000 mg | Freq: Four times a day (QID) | INTRAMUSCULAR | Status: DC | PRN

## 2023-01-08 MED ORDER — TRANEXAMIC ACID-NACL 1000-0.7 MG/100ML-% IV SOLN
INTRAVENOUS | Status: AC
  Filled 2023-01-08: qty 100

## 2023-01-08 MED ORDER — INSULIN ASPART 100 UNIT/ML IJ SOLN: 0.0000 [IU] | Freq: Three times a day (TID) | INTRAMUSCULAR | Status: DC

## 2023-01-08 MED ORDER — SODIUM CHLORIDE FLUSH 0.9 % IV SOLN
INTRAVENOUS | Status: AC
  Filled 2023-01-08: qty 10

## 2023-01-08 MED ORDER — CEFAZOLIN SODIUM-DEXTROSE 2-4 GM/100ML-% IV SOLN
INTRAVENOUS | Status: AC
  Administered 2023-01-08: 2 g via INTRAVENOUS
  Filled 2023-01-08: qty 100

## 2023-01-08 MED ORDER — ACETAMINOPHEN 500 MG PO TABS
ORAL_TABLET | ORAL | Status: AC
  Filled 2023-01-08: qty 2

## 2023-01-08 MED ORDER — ORAL CARE MOUTH RINSE: 15.0000 mL | Freq: Once | OROMUCOSAL | Status: AC

## 2023-01-08 MED ORDER — ACETAMINOPHEN 10 MG/ML IV SOLN
INTRAVENOUS | Status: DC | PRN
  Administered 2023-01-08: 1000 mg via INTRAVENOUS

## 2023-01-08 MED ORDER — KETOROLAC TROMETHAMINE 15 MG/ML IJ SOLN
INTRAMUSCULAR | Status: AC
  Filled 2023-01-08: qty 1

## 2023-01-08 MED ORDER — INSULIN ASPART 100 UNIT/ML IJ SOLN: 0.0000 [IU] | Freq: Every day | INTRAMUSCULAR | Status: DC

## 2023-01-08 MED ORDER — METOCLOPRAMIDE HCL 5 MG PO TABS: 5.0000 mg | ORAL_TABLET | Freq: Three times a day (TID) | ORAL | Status: DC | PRN

## 2023-01-08 MED ORDER — HYDROCODONE-ACETAMINOPHEN 5-325 MG PO TABS
1.0000 | ORAL_TABLET | ORAL | Status: DC | PRN
  Administered 2023-01-08: 1 via ORAL

## 2023-01-08 MED ORDER — BUPIVACAINE HCL (PF) 0.25 % IJ SOLN
INTRAMUSCULAR | Status: AC
  Filled 2023-01-08: qty 30

## 2023-01-08 MED ORDER — ROSUVASTATIN CALCIUM 20 MG PO TABS
ORAL_TABLET | ORAL | Status: AC
  Filled 2023-01-08: qty 1

## 2023-01-08 MED ORDER — KETOROLAC TROMETHAMINE 15 MG/ML IJ SOLN
7.5000 mg | Freq: Four times a day (QID) | INTRAMUSCULAR | Status: AC
  Administered 2023-01-08 – 2023-01-09 (×3): 7.5 mg via INTRAVENOUS

## 2023-01-08 MED ORDER — 0.9 % SODIUM CHLORIDE (POUR BTL) OPTIME
TOPICAL | Status: DC | PRN
  Administered 2023-01-08: 500 mL

## 2023-01-08 MED ORDER — SODIUM CHLORIDE (PF) 0.9 % IJ SOLN
INTRAMUSCULAR | Status: DC | PRN
  Administered 2023-01-08: 50 mL via INTRAMUSCULAR

## 2023-01-08 MED ORDER — EPINEPHRINE PF 1 MG/ML IJ SOLN
INTRAMUSCULAR | Status: AC
  Filled 2023-01-08: qty 1

## 2023-01-08 MED ORDER — SUGAMMADEX SODIUM 200 MG/2ML IV SOLN
INTRAVENOUS | Status: DC | PRN
  Administered 2023-01-08: 200 mg via INTRAVENOUS

## 2023-01-08 MED ORDER — FENTANYL CITRATE (PF) 100 MCG/2ML IJ SOLN
INTRAMUSCULAR | Status: AC
  Filled 2023-01-08: qty 2

## 2023-01-08 MED ORDER — PHENYLEPHRINE HCL-NACL 20-0.9 MG/250ML-% IV SOLN
INTRAVENOUS | Status: AC
  Filled 2023-01-08: qty 250

## 2023-01-08 MED ORDER — CEFAZOLIN SODIUM-DEXTROSE 2-4 GM/100ML-% IV SOLN
2.0000 g | INTRAVENOUS | Status: AC
  Administered 2023-01-08: 2 g via INTRAVENOUS

## 2023-01-08 MED ORDER — DEXAMETHASONE SODIUM PHOSPHATE 10 MG/ML IJ SOLN
INTRAMUSCULAR | Status: AC
  Filled 2023-01-08: qty 1

## 2023-01-08 MED ORDER — FENTANYL CITRATE (PF) 100 MCG/2ML IJ SOLN
25.0000 ug | INTRAMUSCULAR | Status: DC | PRN
  Administered 2023-01-08 (×3): 25 ug via INTRAVENOUS

## 2023-01-08 MED ORDER — HYDROCODONE-ACETAMINOPHEN 5-325 MG PO TABS
ORAL_TABLET | ORAL | Status: AC
  Filled 2023-01-08: qty 1

## 2023-01-08 MED ORDER — OXYCODONE HCL 5 MG PO TABS: 5.0000 mg | ORAL_TABLET | Freq: Once | ORAL | Status: AC

## 2023-01-08 MED ORDER — TRAMADOL HCL 50 MG PO TABS
ORAL_TABLET | ORAL | Status: AC
  Filled 2023-01-08: qty 1

## 2023-01-08 MED ORDER — ONDANSETRON HCL 4 MG PO TABS: 4.0000 mg | ORAL_TABLET | Freq: Four times a day (QID) | ORAL | Status: DC | PRN

## 2023-01-08 MED ORDER — TRAMADOL HCL 50 MG PO TABS
50.0000 mg | ORAL_TABLET | Freq: Four times a day (QID) | ORAL | Status: DC | PRN
  Administered 2023-01-08: 50 mg via ORAL

## 2023-01-08 MED ORDER — KETAMINE HCL 50 MG/5ML IJ SOSY
PREFILLED_SYRINGE | INTRAMUSCULAR | Status: AC
  Filled 2023-01-08: qty 5

## 2023-01-08 MED ORDER — LOSARTAN POTASSIUM 50 MG PO TABS
100.0000 mg | ORAL_TABLET | Freq: Every day | ORAL | Status: DC
  Administered 2023-01-08: 100 mg via ORAL

## 2023-01-08 MED ORDER — TRANEXAMIC ACID-NACL 1000-0.7 MG/100ML-% IV SOLN
1000.0000 mg | INTRAVENOUS | Status: AC
  Administered 2023-01-08: 1000 mg via INTRAVENOUS

## 2023-01-08 MED ORDER — CEFAZOLIN SODIUM-DEXTROSE 2-4 GM/100ML-% IV SOLN
2.0000 g | Freq: Four times a day (QID) | INTRAVENOUS | Status: AC
  Filled 2023-01-08 (×2): qty 100

## 2023-01-08 MED ORDER — SUGAMMADEX SODIUM 500 MG/5ML IV SOLN
INTRAVENOUS | Status: AC
  Filled 2023-01-08: qty 5

## 2023-01-08 MED ORDER — DOCUSATE SODIUM 100 MG PO CAPS
ORAL_CAPSULE | ORAL | Status: AC
  Administered 2023-01-08: 100 mg
  Filled 2023-01-08: qty 1

## 2023-01-08 MED ORDER — HYDROCHLOROTHIAZIDE 12.5 MG PO TABS
12.5000 mg | ORAL_TABLET | Freq: Every day | ORAL | Status: DC
  Administered 2023-01-09: 12.5 mg via ORAL
  Filled 2023-01-08: qty 1

## 2023-01-08 SURGICAL SUPPLY — 74 items
ADH SKN CLS APL DERMABOND .7 (GAUZE/BANDAGES/DRESSINGS) ×1
AGENT HMST KT MTR STRL THRMB (HEMOSTASIS)
APL PRP STRL LF DISP 70% ISPRP (MISCELLANEOUS) ×2
BLADE SAGITTAL AGGR TOOTH XLG (BLADE) ×1 IMPLANT
BNDG CMPR 5X6 CHSV STRCH STRL (GAUZE/BANDAGES/DRESSINGS) ×1
BNDG COHESIVE 6X5 TAN ST LF (GAUZE/BANDAGES/DRESSINGS) ×1 IMPLANT
CHLORAPREP W/TINT 26 (MISCELLANEOUS) ×1 IMPLANT
COVER BACK TABLE REUSABLE LG (DRAPES) ×1 IMPLANT
DERMABOND ADVANCED .7 DNX12 (GAUZE/BANDAGES/DRESSINGS) ×1 IMPLANT
DRAPE 3/4 80X56 (DRAPES) ×1 IMPLANT
DRAPE C-ARM XRAY 36X54 (DRAPES) ×1 IMPLANT
DRAPE POUCH INSTRU U-SHP 10X18 (DRAPES) ×1 IMPLANT
DRSG MEPILEX SACRM 8.7X9.8 (GAUZE/BANDAGES/DRESSINGS) ×1 IMPLANT
DRSG OPSITE POSTOP 4X8 (GAUZE/BANDAGES/DRESSINGS) ×1 IMPLANT
ELECT BLADE 4.0 EZ CLEAN MEGAD (MISCELLANEOUS) ×1
ELECT REM PT RETURN 9FT ADLT (ELECTROSURGICAL) ×1
ELECTRODE BLDE 4.0 EZ CLN MEGD (MISCELLANEOUS) ×1 IMPLANT
ELECTRODE REM PT RTRN 9FT ADLT (ELECTROSURGICAL) ×1 IMPLANT
GLOVE BIO SURGEON STRL SZ8 (GLOVE) ×1 IMPLANT
GLOVE BIOGEL PI IND STRL 8 (GLOVE) ×1 IMPLANT
GLOVE PI ORTHO PRO STRL 7.5 (GLOVE) ×2 IMPLANT
GLOVE PI ORTHO PRO STRL SZ8 (GLOVE) ×2 IMPLANT
GLOVE SURG SYN 7.5  E (GLOVE) ×1
GLOVE SURG SYN 7.5 E (GLOVE) ×1 IMPLANT
GLOVE SURG SYN 7.5 PF PI (GLOVE) ×1 IMPLANT
GOWN STRL REUS W/ TWL LRG LVL3 (GOWN DISPOSABLE) ×1 IMPLANT
GOWN STRL REUS W/ TWL XL LVL3 (GOWN DISPOSABLE) ×2 IMPLANT
GOWN STRL REUS W/TWL LRG LVL3 (GOWN DISPOSABLE) ×1
GOWN STRL REUS W/TWL XL LVL3 (GOWN DISPOSABLE) ×2
HANDLE YANKAUER SUCT OPEN TIP (MISCELLANEOUS) ×1 IMPLANT
HEAD CERAMIC V40 BIOLOX DEL 28 (Orthopedic Implant) IMPLANT
HOLDER FOLEY CATH W/STRAP (MISCELLANEOUS) ×1 IMPLANT
HOOD PEEL AWAY T7 (MISCELLANEOUS) ×2 IMPLANT
IV NS 100ML SINGLE PACK (IV SOLUTION) ×1 IMPLANT
JET LAVAGE IRRISEPT WOUND (IRRIGATION / IRRIGATOR) ×1
KIT PATIENT CARE HANA TABLE (KITS) ×1 IMPLANT
LAVAGE JET IRRISEPT WOUND (IRRIGATION / IRRIGATOR) IMPLANT
LIGHT WAVEGUIDE WIDE FLAT (MISCELLANEOUS) ×1 IMPLANT
LINER 42MM E (Orthopedic Implant) IMPLANT
LINER ADM MDM INS 28/48 42E (Liner) IMPLANT
MANIFOLD NEPTUNE II (INSTRUMENTS) ×1 IMPLANT
MARKER SKIN DUAL TIP RULER LAB (MISCELLANEOUS) ×1 IMPLANT
MAT ABSORB  FLUID 56X50 GRAY (MISCELLANEOUS) ×1
MAT ABSORB FLUID 56X50 GRAY (MISCELLANEOUS) ×1 IMPLANT
NDL SPNL 20GX3.5 QUINCKE YW (NEEDLE) ×1 IMPLANT
NEEDLE SPNL 20GX3.5 QUINCKE YW (NEEDLE) ×1 IMPLANT
NS IRRIG 500ML POUR BTL (IV SOLUTION) ×1 IMPLANT
PACK HIP COMPR (MISCELLANEOUS) ×1 IMPLANT
SCREW HEX LP 6.5X25 (Screw) IMPLANT
SCREW HEX LP 6.5X30 (Screw) IMPLANT
SHELL CLUSTERHOLE ACETABULAR 5 (Shell) IMPLANT
SLEEVE SCD COMPRESS KNEE MED (STOCKING) ×1 IMPLANT
SOLUTION IRRIG SURGIPHOR (IV SOLUTION) ×1 IMPLANT
STEM HIGH OFFSET SZ3 36X101 (Stem) IMPLANT
SURGIFLO W/THROMBIN 8M KIT (HEMOSTASIS) IMPLANT
SUT BONE WAX W31G (SUTURE) ×1 IMPLANT
SUT DVC 2 QUILL PDO  T11 36X36 (SUTURE) ×1
SUT DVC 2 QUILL PDO T11 36X36 (SUTURE) ×1 IMPLANT
SUT ETHIBOND 2 V 37 (SUTURE) ×1 IMPLANT
SUT QUILL MONODERM 3-0 PS-2 (SUTURE) ×1 IMPLANT
SUT QUILL PDO 2 24X24 VLT (SUTURE) ×1 IMPLANT
SUT SILK 0 (SUTURE) ×1
SUT SILK 0 30XBRD TIE 6 (SUTURE) ×1 IMPLANT
SUT VIC AB 0 CT1 36 (SUTURE) ×1 IMPLANT
SUT VIC AB 2-0 CT2 27 (SUTURE) ×2 IMPLANT
SYR 10ML LL (SYRINGE) ×1 IMPLANT
SYR 30ML LL (SYRINGE) ×2 IMPLANT
TAPE MICROFOAM 4IN (TAPE) IMPLANT
TOWEL OR 17X26 4PK STRL BLUE (TOWEL DISPOSABLE) IMPLANT
TRAP FLUID SMOKE EVACUATOR (MISCELLANEOUS) ×1 IMPLANT
TRAY FOLEY SLVR 16FR LF STAT (SET/KITS/TRAYS/PACK) ×1 IMPLANT
TUBE KAMVAC SUCTION (TUBING) IMPLANT
WAND WEREWOLF FASTSEAL 6.0 (MISCELLANEOUS) ×1 IMPLANT
WATER STERILE IRR 1000ML POUR (IV SOLUTION) ×1 IMPLANT

## 2023-01-08 NOTE — Anesthesia Preprocedure Evaluation (Signed)
Anesthesia Evaluation  Patient identified by MRN, date of birth, ID band Patient awake    Reviewed: Allergy & Precautions, NPO status , Patient's Chart, lab work & pertinent test results  History of Anesthesia Complications (+) PONV and history of anesthetic complications  Airway Mallampati: III  TM Distance: >3 FB Neck ROM: full    Dental  (+) Teeth Intact, Caps,    Pulmonary neg pulmonary ROS, sleep apnea , former smoker   Pulmonary exam normal breath sounds clear to auscultation       Cardiovascular hypertension, Pt. on medications negative cardio ROS Normal cardiovascular exam Rhythm:Regular Rate:Normal     Neuro/Psych  Headaches  Anxiety     negative neurological ROS  negative psych ROS   GI/Hepatic negative GI ROS, Neg liver ROS,GERD  Medicated,,  Endo/Other  negative endocrine ROSdiabetesHypothyroidism  Prediabetic, on no Diabetis rx  Renal/GU   negative genitourinary   Musculoskeletal  (+) Arthritis ,  Fibromyalgia -  Abdominal Normal abdominal exam  (+)   Peds negative pediatric ROS (+)  Hematology negative hematology ROS (+) Blood dyscrasia, anemia   Anesthesia Other Findings Past Medical History: No date: Allergy No date: Anemia     Comment:  as a child No date: Anxiety No date: Arthritis     Comment:  Knees, feet, elbows , neck No date: Asthma     Comment:  mild; assoc with seasonal allergies; uses Albuterol once              per year on average. No date: Atypical chest pain     Comment:  hx  No date: Complication of anesthesia     Comment:  nausea, last 2 surgeries nausea much less No date: DM (diabetes mellitus), type 2 ([place]) 12/04/2010: Fibromyalgia     Comment:  [facility] in [place]; Dr. Paschal Dopp;               followed every six months. No date: GERD (gastroesophageal reflux disease) No date: Headache No date: Heart murmur     Comment:  Mitral valve Prolapse No  date: HLD (hyperlipidemia) No date: Hypertension post radioactive iodine: Hypothyroidism     Comment:  Hyperthyroidism; now secondary hypothyroidism No date: MR (mitral regurgitation)     Comment:  moderate; probably MVP. echo 3/10 at [facility] cardiology with               normal EF, moderate MR  No date: Obesity No date: OSA on CPAP No date: PONV (postoperative nausea and vomiting)     Comment:  hard time waking No date: Pre-diabetes  Past Surgical History: 70/35/0093: APPLICATION OF WOUND VAC     Comment:  Procedure: APPLICATION OF WOUND VAC;  Surgeon:               Meade Maw, MD;  Location: [facility] ORS;  Service:               Neurosurgery;;  prevena plus wound vac  12/04/2009: COLONOSCOPY     Comment:  normal; Iftikhar 08/09/2018: COLONOSCOPY WITH PROPOFOL; N/A     Comment:  Procedure: COLONOSCOPY WITH PROPOFOL;  Surgeon:               Virgel Manifold, MD;  Location: [facility] ENDOSCOPY;                Service: Endoscopy;  Laterality: N/A; 08/09/2018: ESOPHAGOGASTRODUODENOSCOPY (EGD) WITH PROPOFOL; N/A     Comment:  Procedure: ESOPHAGOGASTRODUODENOSCOPY (EGD) WITH  PROPOFOL;  Surgeon: Virgel Manifold, MD;  Location:               [facility] ENDOSCOPY;  Service: Endoscopy;  Laterality: N/A; No date: JOINT REPLACEMENT 10/25/2016: KNEE ARTHROPLASTY; Right     Comment:  Procedure: COMPUTER ASSISTED TOTAL KNEE ARTHROPLASTY;                Surgeon: Dereck Leep, MD;  Location: [facility] ORS;                Service: Orthopedics;  Laterality: Right; 06/2009: KNEE SURGERY 1984: LAPAROSCOPY     Comment:  for endomeriosis No date: left foot surgery     Comment:  for plantar fascitis 10/05/2021: LUMBAR LAMINECTOMY/DECOMPRESSION MICRODISCECTOMY; N/A     Comment:  Procedure: L3-5 DECOMPRESSION;  Surgeon: Meade Maw, MD;  Location: [facility] ORS;  Service: Neurosurgery;              Laterality: N/A; 10/05/2021: MAXIMUM ACCESS (MAS) TRANSFORAMINAL LUMBAR  INTERBODY  FUSION (TLIF) 1 LEVEL; N/A     Comment:  Procedure: L5-S1 TRANSFORAMINAL LUMBAR INTERBODY FUSION               (TLIF);  Surgeon: Meade Maw, MD;  Location: [facility]              ORS;  Service: Neurosurgery;  Laterality: N/A; 2007: novascue uterine ablation No date: REPLACEMENT TOTAL KNEE; Left     Reproductive/Obstetrics negative OB ROS                             Anesthesia Physical Anesthesia Plan  ASA: 3  Anesthesia Plan: General   Post-op Pain Management:    Induction: Intravenous  PONV Risk Score and Plan: 1 and Ondansetron and Dexamethasone  Airway Management Planned: LMA and Oral ETT  Additional Equipment:   Intra-op Plan:   Post-operative Plan: Extubation in OR  Informed Consent: I have reviewed the patients History and Physical, chart, labs and discussed the procedure including the risks, benefits and alternatives for the proposed anesthesia with the patient or authorized representative who has indicated his/her understanding and acceptance.     Dental Advisory Given  Plan Discussed with: CRNA and Surgeon  Anesthesia Plan Comments:        Anesthesia Quick Evaluation

## 2023-01-08 NOTE — Transfer of Care (Signed)
Immediate Anesthesia Transfer of Care Note  Patient: Jane [name]  Procedure(s) Performed: TOTAL HIP ARTHROPLASTY ANTERIOR APPROACH (Left: Hip)  Patient Location: PACU  Anesthesia Type:General  Level of Consciousness: awake and drowsy  Airway & Oxygen Therapy: Patient Spontanous Breathing and Patient connected to face mask oxygen  Post-op Assessment: Report given to RN and Post -op Vital signs reviewed and stable  Post vital signs: Reviewed and stable  Last Vitals:  Vitals Value Taken Time  BP 106/58 01/08/23 1330  Temp 36.5 C 01/08/23 1328  Pulse 63 01/08/23 1331  Resp 18 01/08/23 1331  SpO2 100 % 01/08/23 1331  Vitals shown include unvalidated device data.  Last Pain:  Vitals:   01/08/23 0938  TempSrc: Tympanic  PainSc: 0-No pain         Complications: No notable events documented.

## 2023-01-08 NOTE — Interval H&P Note (Signed)
Patient history and physical updated. Consent reviewed including risks, benefits, and alternatives to surgery. Patient agrees with above plan to proceed with left anterior hip replacement.

## 2023-01-08 NOTE — TOC Progression Note (Signed)
Transition of Care [facility]) - Progression Note    Patient Details  Name: Jane [name] MRN: [id number] Date of Birth: 12-12-1960  Transition of Care [facility]) CM/SW [place], RN Phone Number: 01/08/2023, 4:13 PM  Clinical Narrative:    Patient was set up with [facility] for [facility] prior to surgery by surgeons office        Expected Discharge Plan and Services                                               Social Determinants of Health (SDOH) Interventions SDOH Screenings   Food Insecurity: No Food Insecurity (01/08/2023)  Housing: Low Risk  (01/08/2023)  Transportation Needs: No Transportation Needs (01/08/2023)  Utilities: Not At Risk (01/08/2023)  Alcohol Screen: Low Risk  (08/02/2022)  Depression (PHQ2-9): Low Risk  (08/02/2022)  Financial Resource Strain: Low Risk  (08/02/2022)  Social Connections: Unknown (08/02/2022)  Stress: No Stress Concern Present (08/02/2022)  Tobacco Use: Medium Risk (01/08/2023)    Readmission Risk Interventions     No data to display

## 2023-01-08 NOTE — Op Note (Signed)
Patient Name: Jane [name]  OFB:[id number]  Pre-Operative Diagnosis: Left hip Osteoarthritis  Post-Operative Diagnosis: (same)  Procedure: Left Total Hip Arthroplasty  Components/Implants: Cup: Trident II Tritanium clusterhole 63mm cup wx2 screws    Liner: MDM 42/E liner  Stem: Insignia #3 High offset  Head:28 +38mm w/ 28/48 E X3 ADM/MDM  Date of Surgery: 01/08/2023  Surgeon: Steffanie Rainwater MD  Assistant: Dorise Hiss PA (present and scrubbed throughout the case, critical for assistance with exposure, retraction, instrumentation, and closure)   Anesthesiologist: [facility]  Anesthesia: General (due to lumbosacral fusion)  EBL: 200cc  POE:[id number]  Complications: None   Brief history: The patient is a 62 year old female with a history of osteoarthritis of the left hip with pain limiting their range of motion and activities of daily living, which has failed multiple attempts at conservative therapy.  The risks and benefits of total hip arthroplasty as definitive surgical treatment were discussed with the patient, who opted to proceed with the operation.  After outpatient medical clearance and optimization was completed the patient was admitted to [facility] for the procedure.  All preoperative films were reviewed and an appropriate surgical plan was made prior to surgery.   Description of procedure: The patient was brought to the operating room where laterality was confirmed by all those present to be the left side.  The patient was administered general anesthesia on a stretcher prior to being moved supine on the operating room table. Patient was given an intravenous dose of antibiotics for surgical prophylaxis and TXA.  All bony prominences and extremities were well padded and the patient was securely attached to the table boots, a perineal post was placed and the patient had a safety strap placed.  Surgical site was prepped with alcohol and chlorhexidine. The  surgical site over the hip was and draped in typical sterile fashion with multiple layers of adhesive and nonadhesive drapes.  The incision site was marked out with a sterile marker and care was taken to assess the position of the ASIS and ensure appropriate position for the incision.    A surgical timeout was then called with participation of all staff in the room the patient was then a confirmed again and laterality confirmed.  Incision was made over the anterior lateral aspect of the proximal thigh in line with the TFL.  Appropriate retractors were placed and all bleeding vessels were coagulated within the subcutaneous and fatty layers.  An incision was made in the TFL fascia in the interval was carefully identified.  The lateral ascending branches of the circumflex vessels were identified, cauterized and carefully dissected.  Retractors were placed around the superior lateral and inferior medial aspects of the femoral neck and a capsulotomy was performed exposing the hip joint.  Retraction stitches were placed and the capsulotomy to assist with visualization.  Femoral neck cut was then made and the femoral head was extracted after placing the leg in traction.  Bone wax was then applied to the proximal cut surface of the femur and aqua mantis was used to address any bleeding around the femoral neck cut.  Retractors were then placed around the acetabulum to fully visualize the joint space, and the remaining labral tissue was removed and pulvinar was removed.   The acetabulum was then sequentially reamed up to the appropriate size in order to get good fit and fill for the acetabular component while under fluoroscopic guidance.  Acetabular component was then placed and malleted into a secure fit while confirming  position and abduction angle and anteversion utilizing fluoroscopy.  2 screws were then placed in the acetabular cup to assist in securing the cup in place.  A trial acetabular polyliner was then placed  and attention moved to the femur.  The femur traction was dropped and sequentially externally rotated while performing a release of the posterior and superomedial tissues off of the proximal femur to allow for mobility, care was taken to preserve the external rotators and piriformis attachments.  The remaining interval between the abductors and the capsule was dissected out and a retractor was placed over the superolateral aspect of the femur over the greater trochanter.  The leg was carefully brought down into extension and adducted to provide visualization of the proximal femur for broaching.  The femur was then sequentially broached up to an appropriate size which provided for good fill and stability to the femoral broach.  A trial neck and head were placed on the femoral broach and the leg was brought up for reduction.  The hip was reduced and manual check of stability was performed with the boot detached from the table.  The hip was found to be stable in flexion internal rotation and extension external rotation.  Leg lengths were confirmed on fluoroscopy.   The hip was then dislocated the trial neck and head were removed.  The trial acetabular liner was removed.  The hip was then irrigated with normal saline and the final poly liner was implanted.  The leg was then brought down into extension and adduction in the proximal femur was reexposed.  The broach trial was removed and the femur was irrigated with normal saline prior to the real femoral stem being implanted.  After the femoral stem was seated and shown to have good fit and fill the appropriate head was impacted the leg was brought up and reduced.  There was good range of motion with stability in flexion internal rotation and extension external rotation on testing.  Leg lengths were found to be appropriate on fluoroscopic evaluation at this time.  The hip was then irrigated with irrisept solution and then saline solution.  The capsulotomy was repaired  with Ethibond sutures.  A pericapsular and peritrochanteric cocktail with Exparel and bupivacaine was then injected as well as the subcutaneous tissues. The fascia was closed with a #2 barbed running suture.  The deep tissues were closed with Vicryl sutures the subcutaneous tissues were closed with interrupted Vicryl sutures and a running barbed 3-0 suture.  The skin was then reinforced with Dermabond and a sterile dressing was placed.   The patient was awoken from anesthesia transferred off of the operating room table onto a hospital bed where examination of leg lengths found the leg lengths to be equal with a good distal pulse.  The patient was then transferred to the PACU in stable condition.

## 2023-01-08 NOTE — H&P (Signed)
History of Present Illness: Jane [name] is an 62 y.o. female who presents for evaluation of her left hip. Patient reports ongoing pain for about 3 years in the left groin which worsens with walking ambulation certain movements, and getting up and down from a seated position. She reports she has undergone several left hip injections. Her first hip injection was about 3 years ago and offered her about 2 years of relief. She underwent another hip injection this past summer in August 2023 and reported that she had good relief but it only lasted for a week or 2 before her pain came back. She reports that she is been treating the pain with Tylenol, meloxicam, and exercises and therapy which she underwent during and after her knee replacements and her spine surgery. She reports no improvement in pain or function of her left hip with therapy and conservative measures and that her left hip has become a functional disability for her preventing her from walking and performing activities of daily living. The patient denies fevers, chills, numbness, tingling, shortness of breath, chest pain, recent illness, or any trauma.  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 Marry Guan Right total knee arthroplasty using computer-assisted navigation 10/25/2016 Dr Marry Guan L3-5  DECOMPRESSION 10/05/2021 Dr. Meade Maw at [facility], [place] Inner thigh surgery NovaSure implantation endometrial ablation Wisdom tooth  Past Family History: Family History Problem Relation Age of Onset Arthritis Mother Myocardial Infarction (Heart attack) Mother High blood pressure (Hypertension) Mother Hyperlipidemia (Elevated cholesterol) Mother Arthritis Father Stroke Father Hyperlipidemia (Elevated cholesterol) Father High blood pressure (Hypertension) Sister Myocardial Infarction (Heart attack) Sister Stroke Sister COPD Sister High blood pressure (Hypertension) Sister COPD Sister  Medications: Current Outpatient Medications Ordered in Epic Medication Sig Dispense Refill buPROPion (WELLBUTRIN XL) 150 MG XL tablet TAKE ONE TABLET BY MOUTH EVERY DAY 90 tablet 1 clonazePAM (KLONOPIN) 0.5 MG tablet Take 0.5 mg by mouth at bedtime as needed cyclobenzaprine (FLEXERIL) 5 MG tablet Take by mouth as directed (Patient not taking: Reported on 12/18/2022) hydroCHLOROthiazide (HYDRODIURIL) 12.5 MG tablet TAKE ONE TABLET BY MOUTH EVERY DAY 90 tablet 1 levothyroxine (SYNTHROID) 112 MCG tablet TAKE 1 TABLET BY MOUTH DAILY 30 MINUTES BEFORE BREAKFAST 90 tablet 3 losartan (COZAAR) 100 MG tablet TAKE 1 TABLET BY MOUTH DAILY 90 tablet 1 meloxicam (MOBIC) 15 MG tablet Take 15 mg by mouth once daily as needed (Patient not taking: Reported on 12/18/2022) montelukast (SINGULAIR) 10 mg tablet Take 1 tablet (10 mg total) by mouth at bedtime 90 tablet 3 omeprazole (PRILOSEC) 20 MG DR capsule TAKE 1 CAPSULE BY MOUTH TWICE DAILY BEFORE MEALS 180 capsule 3 rosuvastatin (CRESTOR) 10 MG tablet TAKE ONE TABLET BY MOUTH ONCE DAILY 90 tablet 0 semaglutide (OZEMPIC) 1 mg/dose (4 mg/3 mL) pen injector Inject 0.75 mLs (1 mg total) subcutaneously once a week 9 mL 1 traMADoL (ULTRAM) 50 mg tablet Take 50 mg by mouth as needed for Pain (Patient not taking: Reported on 12/18/2022) UNABLE TO FIND Med Name: CPAP  Machine venlafaxine (EFFEXOR-XR) 150 MG XR capsule TAKE  1 CAPSULE BY MOUTH ONCE DAILY 90 capsule 2  No current Epic-ordered facility-administered medications on file.  Allergies: Allergies Allergen Reactions Celebrex [Celecoxib] Shortness Of Breath Swelling. Atorvastatin Other (See Comments) myalgia   Review of Systems: A comprehensive 14 point ROS was performed, reviewed, and the pertinent orthopaedic findings are documented in the HPI.  Physical Exam: There is no height or weight on file to calculate BMI. General/Constitutional: No apparent distress: well-nourished and well developed. Lymphatic: No palpable adenopathy. Pulmonary exam: Lungs clear to auscultation bilaterally no wheezing rales or rhonchi Cardiac exam: Regular rate and rhythm no obvious murmurs rubs or gallops. Vascular: No edema, swelling or tenderness, except as noted in detailed exam. Integumentary: No impressive skin lesions present, except as noted in detailed exam. Neuro/Psych: Normal mood and affect, oriented to person, place and time. Musculoskeletal: Normal, except as noted in detailed exam and in HPI.  Left hip exam  SKIN: intact SWELLING: none WARMTH: no warmth TENDERNESS: none, Stinchfield Positive ROM: 0 degrees internal rotation and 20 degrees external rotation and pain with internal rotation and and external rotation localized to the groin,; Hip Flexion 95 with pain at max flexion STRENGTH: normal GAIT: stiff-legged STABILITY: stable to testing CREPITUS: yes LEG LENGTH DISCREPANCY: none NEUROLOGICAL EXAM: normal VASCULAR EXAM: normal LUMBAR SPINE: tenderness: no, healed midline incision lumbosacral spine with no erythema or signs of infection straight leg raising sign: no motor exam: normal  The contralateral hip was examined for comparison and it showed: TENDERNESS: none ROM: normal and full with some mild groin pain with internal rotation STRENGTH: normal STABILITY: stable to  testing  Hip Imaging : I have reviewed AP pelvis and left hip lateral x-rays taken on 12/18/2022 images reviewed by myself. There is moderate to severe degenerative changes of the left hip joint with osteophyte formation on the acetabulum and the femoral head with head shape deformity joint space narrowing, and sclerosis there is some subchondral cyst formation in the femoral head. The right hip joint also has moderate degenerative changes with joint space narrowing and osteophyte formation as well as sclerosis. No fractures or dislocations noted about the pelvis or hips.  Assessment: Left hip osteoarthritis  Plan: Jane [name] is a 62 year old female who presents with left hip bone on bone arthritis. Based upon the patient's continued symptoms and failure to respond to conservative treatment, I have recommended a left total hip replacement for this patient. A long discussion took place with the patient describing what a total joint replacement is and what the procedure would entail. A hip model, similar to the implants that will be used during the operation, was utilized to demonstrate the implants. Choices of implant manufactures were discussed and reviewed. The ability to secure the implant utilizing cement or cementless (press fit) fixation was discussed. Anterior and posterior exposures were discussed. For this patient an appropriate approach will be anterior.  The hospitalization and post-operative care and rehabilitation were also discussed. The use of perioperative antibiotics and DVT prophylaxis were discussed. The risk, benefits and alternatives to a surgical intervention were discussed at length with the patient. The patient was also advised of risks related to the medical comorbidities and elevated body mass index (BMI). A lengthy discussion took place to review the most common complications including but not limited to: deep vein thrombosis, pulmonary embolus, heart attack, stroke, infection, wound  breakdown, dislocation, numbness, leg length in-equality, intraoperative fracture, damage to nerves, tendon,muscles, arteries or other blood vessels, death and other possible complications from anesthesia. The  patient was told that we will take steps to minimize these risks by using sterile technique, antibiotics and DVT prophylaxis when appropriate and follow the patient postoperatively in the office setting to monitor progress. The possibility of recurrent pain, no improvement in pain and actual worsening of pain were also discussed with the patient. The risk of dislocation following total hip replacement was discussed and potential precautions to prevent dislocation were reviewed. We then additional conversation discussing her increased risk of prosthetic loosening and wear given her young age.  The discharge plan of care focused on the patient going home following surgery. The patient was encouraged to make the necessary arrangements to have someone stay with them when they are discharged home.  The benefits of surgery were discussed with the patient including the potential for improving the patient's current clinical condition through operative intervention. Alternatives to surgical intervention including continued conservative management were also discussed in detail. All questions were answered to the satisfaction of the patient. The patient participated and agreed to the plan of care as well as the use of the recommended implants for their total hip replacement surgery. An information packet was given to the patient to review prior to surgery.   Patient received medical clearance prior to surgical date. All questions answered patient agrees with the above plan to proceed with a left anterior total hip replacement.  Portions of this record have been created using [employer]. Dictation errors have been sought, but may not have been identified and corrected.  Steffanie Rainwater MD

## 2023-01-08 NOTE — Evaluation (Signed)
.plPhysical Therapy Evaluation Patient Details Name: Jane [name] MRN: [id number] DOB: March 10, 1961 Today's Date: 01/08/2023  History of Present Illness  Pt is a 62 yo F diagnosed with L hip OA and is s/p elective L THA.  PMH includes fibromyalgia, HTN, and OA.  Clinical Impression  Pt was pleasant and motivated to participate during the session and put forth good effort throughout. Pt required only minimal assist with bed mobility tasks and no physical assist with transfers or during limited ambulation.  Pt's ambulation limited by onset of nausea and lightheadedness with pt returned to sitting and seated BP taken at 95/48 with HR 63, nursing in room and aware.  Pt returned to supine and reported feeling better almost immediately with nursing taking over session and stating she would take a supine BP.  Pt is expected to make good progress while in acute care and will benefit from HHPT upon discharge to safely address deficits listed in patient problem list for decreased caregiver assistance and eventual return to PLOF.         Recommendations for follow up therapy are one component of a multi-disciplinary discharge planning process, led by the attending physician.  Recommendations may be updated based on patient status, additional functional criteria and insurance authorization.  Follow Up Recommendations Home health PT      Assistance Recommended at Discharge Intermittent Supervision/Assistance  Patient can return home with the following  A little help with walking and/or transfers;A little help with bathing/dressing/bathroom;Assistance with cooking/housework;Help with stairs or ramp for entrance;Assist for transportation    Equipment Recommendations None recommended by PT  Recommendations for Other Services       Functional Status Assessment Patient has had a recent decline in their functional status and demonstrates the ability to make significant improvements in function in a  reasonable and predictable amount of time.     Precautions / Restrictions Precautions Precautions: Fall;Anterior Hip Precaution Booklet Issued: Yes (comment) Restrictions Weight Bearing Restrictions: Yes LLE Weight Bearing: Weight bearing as tolerated      Mobility  Bed Mobility Overal bed mobility: Needs Assistance Bed Mobility: Supine to Sit, Sit to Supine     Supine to sit: Supervision Sit to supine: Min assist   General bed mobility comments: Min A for LLE management during sit to sup with cues for sequencing for hip prec compliance    Transfers Overall transfer level: Needs assistance Equipment used: Rolling walker (2 wheels) Transfers: Sit to/from Stand Sit to Stand: Min guard           General transfer comment: Min verbal cues for sequencing for hand placement and increased trunk flexion    Ambulation/Gait Ambulation/Gait assistance: Min guard Gait Distance (Feet): 5 Feet Assistive device: Rolling walker (2 wheels) Gait Pattern/deviations: Step-to pattern, Decreased step length - right, Decreased stance time - left, Antalgic Gait velocity: decreased     General Gait Details: Mildly antalgic with distance limited by nausea and lightheadedness; pt returned to sitting with seated BP 95/48 and HR 63, nurse in room and aware  Stairs            Wheelchair Mobility    Modified Rankin (Stroke Patients Only)       Balance Overall balance assessment: Needs assistance   Sitting balance-Leahy Scale: Normal     Standing balance support: Bilateral upper extremity supported, During functional activity Standing balance-Leahy Scale: Fair  Pertinent Vitals/Pain Pain Assessment Pain Assessment: 0-10 Pain Score: 4  Pain Location: L hip Pain Descriptors / Indicators: Aching, Sore Pain Intervention(s): Monitored during session, Premedicated before session, Repositioned, Ice applied    Home Living Family/patient  expects to be discharged to:: Private residence Living Arrangements: Spouse/significant other Available Help at Discharge: Family;Available 24 hours/day Type of Home: House Home Access: Stairs to enter Entrance Stairs-Rails: Left Entrance Stairs-Number of Steps: 5   Home Layout: One level Home Equipment: Conservation officer, nature (2 wheels);Cane - single point;BSC/3in1      Prior Function Prior Level of Function : Independent/Modified Independent             Mobility Comments: Ind amb community distances without an AD, no fall history ADLs Comments: Ind with ADLs     Hand Dominance   Dominant Hand: Right    Extremity/Trunk Assessment   Upper Extremity Assessment Upper Extremity Assessment: Overall WFL for tasks assessed    Lower Extremity Assessment Lower Extremity Assessment: LLE deficits/detail LLE Deficits / Details: BLE ankle strength, AROM, and sensation to light touch grossly intact, pt with baseline sensation deficits in feet from neuropathy per patient and reported as at baseline per patient       Communication   Communication: No difficulties  Cognition Arousal/Alertness: Awake/alert Behavior During Therapy: WFL for tasks assessed/performed Overall Cognitive Status: Within Functional Limits for tasks assessed                                          General Comments      Exercises Total Joint Exercises Ankle Circles/Pumps: Strengthening, Both, 10 reps Long Arc Quad: Strengthening, Both, 10 reps Knee Flexion: Strengthening, Both, 10 reps Marching in Standing: Strengthening, Both, 5 reps, Standing Other Exercises Other Exercises: Anterior hip precaution education provided and handout reviewed Other Exercises: HEP education reviewed per handout   Assessment/Plan    PT Assessment Patient needs continued PT services  PT Problem List Decreased strength;Decreased activity tolerance;Decreased balance;Decreased mobility;Decreased knowledge of use  of DME;Decreased knowledge of precautions;Pain       PT Treatment Interventions DME instruction;Gait training;Stair training;Functional mobility training;Therapeutic activities;Therapeutic exercise;Balance training;Patient/family education    PT Goals (Current goals can be found in the Care Plan section)  Acute Rehab PT Goals Patient Stated Goal: To walk better with less pain PT Goal Formulation: With patient Time For Goal Achievement: 01/21/23 Potential to Achieve Goals: Good    Frequency BID     Co-evaluation               AM-PAC PT "6 Clicks" Mobility  Outcome Measure Help needed turning from your back to your side while in a flat bed without using bedrails?: A Little Help needed moving from lying on your back to sitting on the side of a flat bed without using bedrails?: A Little Help needed moving to and from a bed to a chair (including a wheelchair)?: A Little Help needed standing up from a chair using your arms (e.g., wheelchair or bedside chair)?: A Little Help needed to walk in hospital room?: A Lot Help needed climbing 3-5 steps with a railing? : A Lot 6 Click Score: 16    End of Session Equipment Utilized During Treatment: Gait belt Activity Tolerance: Other (comment) (Pt limited by nausea and lightheadedness in standing) Patient left: in bed;with call bell/phone within reach;with bed alarm set;with nursing/sitter in room Nurse Communication:  Mobility status;Other (comment) (Nursing in room and aware of low BP with symptoms per above) PT Visit Diagnosis: Other abnormalities of gait and mobility (R26.89);Muscle weakness (generalized) (M62.81);Pain Pain - Right/Left: Left Pain - part of body: Hip    Time: 0938-1829 PT Time Calculation (min) (ACUTE ONLY): 26 min   Charges:   PT Evaluation $PT Eval Moderate Complexity: 1 Mod PT Treatments $Therapeutic Exercise: 8-22 mins      D. Royetta Asal PT, DPT 01/08/23, 5:29 PM

## 2023-01-08 NOTE — Anesthesia Procedure Notes (Addendum)
Procedure Name: Intubation Date/Time: 01/08/2023 10:53 AM  Performed by: Natalyia Innes, [place], CRNAPre-anesthesia Checklist: Patient identified, Emergency Drugs available, Suction available and Patient being monitored Patient Re-evaluated:Patient Re-evaluated prior to induction Oxygen Delivery Method: Circle system utilized Preoxygenation: Pre-oxygenation with 100% oxygen Induction Type: IV induction Ventilation: Mask ventilation without difficulty Tube type: Oral Tube size: 7.0 mm Number of attempts: 1 Airway Equipment and Method: Stylet Placement Confirmation: ETT inserted through vocal cords under direct vision, positive ETCO2 and breath sounds checked- equal and bilateral Secured at: 21 cm Tube secured with: Tape Dental Injury: Teeth and Oropharynx as per pre-operative assessment

## 2023-01-09 ENCOUNTER — Encounter: Payer: Self-pay | Admitting: Orthopedic Surgery

## 2023-01-09 DIAGNOSIS — M1612 Unilateral primary osteoarthritis, left hip: Secondary | ICD-10-CM | POA: Diagnosis not present

## 2023-01-09 LAB — BASIC METABOLIC PANEL
Anion gap: 7 (ref 5–15)
BUN: 11 mg/dL (ref 8–23)
CO2: 25 mmol/L (ref 22–32)
Calcium: 7.9 mg/dL — ABNORMAL LOW (ref 8.9–10.3)
Chloride: 106 mmol/L (ref 98–111)
Creatinine, Ser: 0.57 mg/dL (ref 0.44–1.00)
GFR, Estimated: >60 mL/min (ref >60)
Glucose, Bld: 100 mg/dL — ABNORMAL HIGH (ref 70–99)
Potassium: 3.3 mmol/L — ABNORMAL LOW (ref 3.5–5.1)
Sodium: 138 mmol/L (ref 135–145)

## 2023-01-09 LAB — CBC
HCT: 33.6 % — ABNORMAL LOW (ref 36.0–46.0)
Hemoglobin: 11.1 g/dL — ABNORMAL LOW (ref 12.0–15.0)
MCH: 29 pg (ref 26.0–34.0)
MCHC: 33 g/dL (ref 30.0–36.0)
MCV: 87.7 fL (ref 80.0–100.0)
Platelets: 217 10*3/uL (ref 150–400)
RBC: 3.83 MIL/uL — ABNORMAL LOW (ref 3.87–5.11)
RDW: 13.5 % (ref 11.5–15.5)
WBC: 10.5 10*3/uL (ref 4.0–10.5)
nRBC: 0 % (ref 0.0–0.2)

## 2023-01-09 LAB — GLUCOSE, CAPILLARY: Glucose-Capillary: 123 mg/dL — ABNORMAL HIGH (ref 70–99)

## 2023-01-09 MED ORDER — KETOROLAC TROMETHAMINE 15 MG/ML IJ SOLN
INTRAMUSCULAR | Status: AC
  Filled 2023-01-09: qty 1

## 2023-01-09 MED ORDER — TRAMADOL HCL 50 MG PO TABS: 50.0000 mg | ORAL_TABLET | Freq: Four times a day (QID) | ORAL | 0 refills | Status: DC | PRN

## 2023-01-09 MED ORDER — INSULIN ASPART 100 UNIT/ML IJ SOLN
INTRAMUSCULAR | Status: AC
  Administered 2023-01-09: 2 [IU] via SUBCUTANEOUS
  Filled 2023-01-09: qty 1

## 2023-01-09 MED ORDER — HYDROCODONE-ACETAMINOPHEN 5-325 MG PO TABS: 1.0000 | ORAL_TABLET | Freq: Three times a day (TID) | ORAL | 0 refills | Status: DC | PRN

## 2023-01-09 MED ORDER — ACETAMINOPHEN 500 MG PO TABS
ORAL_TABLET | ORAL | Status: AC
  Filled 2023-01-09: qty 2

## 2023-01-09 MED ORDER — ACETAMINOPHEN 500 MG PO TABS: 1000.0000 mg | ORAL_TABLET | Freq: Three times a day (TID) | ORAL | 0 refills | Status: DC

## 2023-01-09 MED ORDER — MELOXICAM 15 MG PO TABS: 15.0000 mg | ORAL_TABLET | Freq: Every day | ORAL | 0 refills | Status: AC

## 2023-01-09 MED ORDER — CEFAZOLIN SODIUM-DEXTROSE 2-4 GM/100ML-% IV SOLN
INTRAVENOUS | Status: AC
  Filled 2023-01-09: qty 100

## 2023-01-09 MED ORDER — ONDANSETRON HCL 4 MG PO TABS: 4.0000 mg | ORAL_TABLET | Freq: Four times a day (QID) | ORAL | 0 refills | Status: DC | PRN

## 2023-01-09 MED ORDER — ENOXAPARIN SODIUM 40 MG/0.4ML IJ SOSY
PREFILLED_SYRINGE | INTRAMUSCULAR | Status: AC
  Administered 2023-01-09: 40 mg via SUBCUTANEOUS
  Filled 2023-01-09: qty 0.4

## 2023-01-09 MED ORDER — ENOXAPARIN SODIUM 40 MG/0.4ML IJ SOSY: 40.0000 mg | PREFILLED_SYRINGE | INTRAMUSCULAR | 0 refills | Status: DC

## 2023-01-09 MED ORDER — DOCUSATE SODIUM 100 MG PO CAPS: 100.0000 mg | ORAL_CAPSULE | Freq: Two times a day (BID) | ORAL | 0 refills | Status: DC

## 2023-01-09 NOTE — Discharge Summary (Signed)
Physician Discharge Summary  Patient ID: Jane [name] MRN: [id number] DOB/AGE: Aug 23, 1961 62 y.o.  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 10/25/2016   Vitamin D deficiency 09/23/2016   Glucose intolerance (impaired glucose tolerance) 01/01/2016   Class 1 obesity due to excess calories with serious comorbidity and body mass index (BMI) of 33.0 to 33.9 in adult 01/01/2016   Gastroesophageal reflux disease without esophagitis 01/01/2016   Asthma, mild intermittent 01/01/2016   GAD (generalized anxiety disorder) 11/09/2014   Osteoarthritis, knee 10/20/2012   Fibromyalgia 10/20/2012   Hypothyroidism following radioiodine therapy 10/20/2012   Allergic rhinitis 02/24/2010   Hyperlipidemia 12/31/2009   MURMUR 12/31/2009    Past Medical History:  Diagnosis Date   Allergy    Anemia    as a child   Anxiety    Arthritis    Knees, feet, elbows , neck   Asthma    mild; assoc with seasonal allergies; uses Albuterol once per year on average.   Atypical chest pain    hx    Complication of anesthesia    nausea, last 2 surgeries nausea much less   DM (diabetes mellitus), type 2 ([place])    Fibromyalgia 12/04/2010   [facility] in [place]; Dr. Paschal Dopp; followed every six months.   GERD (gastroesophageal reflux disease)    Headache    Heart murmur    Mitral valve Prolapse   HLD (hyperlipidemia)    Hypertension    Hypothyroidism post radioactive iodine   Hyperthyroidism; now secondary  hypothyroidism   MR (mitral regurgitation)    moderate; probably MVP. echo 3/10 at [facility] cardiology with normal EF, moderate MR    Obesity    OSA on CPAP    PONV (postoperative nausea and vomiting)    hard time waking   Pre-diabetes      Transfusion: none   Consultants (if any):   Discharged Condition: Improved  Hospital Course: Jane [name] is an 62 y.o. female who was admitted 01/08/2023 with a diagnosis of Osteoarthritis of left hip and went to the operating room on 01/08/2023 and underwent the above named procedures.    Surgeries: Procedure(s): TOTAL HIP ARTHROPLASTY ANTERIOR APPROACH on 01/08/2023 Patient tolerated the surgery well. Taken to PACU where she was stabilized and then transferred to the orthopedic floor.  Started on Lovenox 40 mg q 24 hrs. TEDs and SCDs applied bilaterally. Heels elevated on bed. No evidence of DVT. Negative Homan. Physical therapy started on day #1 for gait training and transfer. OT started day #1 for ADL and assisted devices. Patient's IV  was d/c on day #1. Patient was able to safely and independently complete all PT goals. PT recommending discharge to home.  On post op day #1 patient was stable and ready for discharge to home with HHPT.  Implants: Cup: Trident II Tritanium clusterhole 30mm cup wx2 screws    Liner: MDM 42/E liner  Stem: Insignia #3 High offset  Head:28 +68mm w/ 28/48 E X3 ADM/MDM   She was given perioperative antibiotics:  Anti-infectives (  From admission, onward)    Start     Dose/Rate Route Frequency Ordered Stop   01/09/23 0205  ceFAZolin (ANCEF) 2-4 GM/100ML-% IVPB       Note to Pharmacy: [place] Calix, Cryst: cabinet override      01/09/23 0205 01/09/23 0211   01/08/23 1700  ceFAZolin (ANCEF) IVPB 2g/100 mL premix        2 g 200 mL/hr over 30 Minutes Intravenous Every 6 hours 01/08/23 1404 01/09/23 0241   01/08/23 0920  ceFAZolin (ANCEF) 2-4 GM/100ML-% IVPB       Note to Pharmacy: Rebecca Eaton F: cabinet override       01/08/23 0920 01/08/23 1805   01/08/23 0915  ceFAZolin (ANCEF) IVPB 2g/100 mL premix        2 g 200 mL/hr over 30 Minutes Intravenous On call to O.R. 01/08/23 0912 01/08/23 1106     .  She was given sequential compression devices, early ambulation, and Lovenox TEDs for DVT prophylaxis.  She benefited maximally from the hospital stay and there were no complications.    Recent vital signs:  Vitals:   01/09/23 0754 01/09/23 1021  BP: (!) 120/52 (!) 129/53  Pulse: 80 85  Resp: 16 16  Temp: 98.4 F (36.9 C) 98.6 F (37 C)  SpO2: 96% 96%    Recent laboratory studies:  Lab Results  Component Value Date   HGB 11.1 (L) 01/09/2023   HGB 10.8 (L) 10/06/2021   HGB 13.7 09/27/2021   Lab Results  Component Value Date   WBC 10.5 01/09/2023   PLT 217 01/09/2023   Lab Results  Component Value Date   INR 1.0 09/27/2021   Lab Results  Component Value Date   NA 138 01/09/2023   K 3.3 (L) 01/09/2023   CL 106 01/09/2023   CO2 25 01/09/2023   BUN 11 01/09/2023   CREATININE 0.57 01/09/2023   GLUCOSE 100 (H) 01/09/2023    Discharge Medications:   Allergies as of 01/09/2023       Reactions   Celebrex [celecoxib] Swelling, Shortness Of Breath   Swelling. Developed Facial swelling    Atorvastatin Other (See Comments)   myalgia        Medication List     TAKE these medications    acetaminophen 500 MG tablet Commonly known as: TYLENOL Take 2 tablets (1,000 mg total) by mouth every 8 (eight) hours.   clonazePAM 0.5 MG tablet Commonly known as: KLONOPIN Take 0.5-1 tablets (0.25-0.5 mg total) by mouth 2 (two) times daily as needed for anxiety. What changed: how much to take   docusate sodium 100 MG capsule Commonly known as: COLACE Take 1 capsule (100 mg total) by mouth 2 (two) times daily.   enoxaparin 40 MG/0.4ML injection Commonly known as: LOVENOX Inject 0.4 mLs (40 mg total) into the skin daily for 14 days.   hydrochlorothiazide 12.5 MG capsule Commonly known  as: MICROZIDE Take 12.5 mg by mouth every morning.   HYDROcodone-acetaminophen 5-325 MG tablet Commonly known as: NORCO/VICODIN Take 1 tablet by mouth every 8 (eight) hours as needed (severe breakthrough pain).   levothyroxine 112 MCG tablet Commonly known as: SYNTHROID TAKE 1 TABLET BY MOUTH DAILY 30 MINUTES BEFORE BREAKFAST What changed:  how much to take how to take this when to take this   losartan 100 MG tablet Commonly known as: COZAAR TAKE 1 TABLET BY MOUTH DAILY What changed: when to take this   meloxicam 15 MG tablet Commonly known as: MOBIC Take  1 tablet (15 mg total) by mouth daily for 14 days. What changed:  when to take this reasons to take this   montelukast 10 MG tablet Commonly known as: SINGULAIR TAKE 1 TABLET AT BEDTIME   omeprazole 20 MG capsule Commonly known as: PRILOSEC TAKE 1 CAPSULE BY MOUTH TWICE DAILY BEFORE MEALS What changed: See the new instructions.   ondansetron 4 MG tablet Commonly known as: ZOFRAN Take 1 tablet (4 mg total) by mouth every 6 (six) hours as needed for nausea.   Ozempic (1 MG/DOSE) 2 MG/1.5ML Sopn Generic drug: Semaglutide (1 MG/DOSE) Inject 1 mg into the skin once a week. Fridays   polyethylene glycol 17 g packet Commonly known as: MIRALAX / GLYCOLAX Take 17 g by mouth daily as needed for mild constipation.   rosuvastatin 10 MG tablet Commonly known as: CRESTOR Take 10 mg by mouth at bedtime.   traMADol 50 MG tablet Commonly known as: ULTRAM Take 1 tablet (50 mg total) by mouth every 6 (six) hours as needed for moderate pain.   venlafaxine XR 150 MG 24 hr capsule Commonly known as: EFFEXOR-XR TAKE 1 CAPSULE BY MOUTH DAILY WITH BREAKFAST. What changed: how to take this        Diagnostic Studies: DG HIP UNILAT WITH PELVIS 1V LEFT  Result Date: 01/08/2023 CLINICAL DATA:  [id number] Surgery, elective [id number] EXAM: DG HIP (WITH OR WITHOUT PELVIS) 1V*L* COMPARISON:  September 13, 2022 FINDINGS: Spot fluoroscopy  images were obtained for surgical planning purposes. These demonstrate placement of a LEFT hip arthroplasty. Time: 24 seconds Dose: 3.17 mGy Please reference procedure report for further details. IMPRESSION: Spot fluoroscopy images obtained for surgical planning purposes. Electronically Signed   By: Valentino Saxon M.D.   On: 01/08/2023 14:14   DG C-Arm 1-60 Min-No Report  Result Date: 01/08/2023 Fluoroscopy was utilized by the requesting physician.  No radiographic interpretation.   DG C-Arm 1-60 Min-No Report  Result Date: 01/08/2023 Fluoroscopy was utilized by the requesting physician.  No radiographic interpretation.   DG C-Arm 1-60 Min-No Report  Result Date: 01/08/2023 Fluoroscopy was utilized by the requesting physician.  No radiographic interpretation.    Disposition: Discharge disposition: 06-Home-Health Care Svc          Follow-up Information     Duanne Guess, PA-C Follow up in 2 week(s).   Specialties: Orthopedic Surgery, Emergency Medicine Contact information: [place] [address] [phone]                  Signed: Feliberto Gottron 01/09/2023, 11:47 AM

## 2023-01-09 NOTE — Discharge Instructions (Signed)
Instructions after Anterior Total Hip Replacement        Dr. Zachary Aberman, Jr., M.D.      Dept. of Orthopaedics & Sports Medicine  [facility]  [address]  [place], [place]  [address]  Phone: [phone]   Fax: [phone]    DIET: Drink plenty of non-alcoholic fluids. Resume your normal diet. Include foods high in fiber.  ACTIVITY:  You may use crutches or a walker with weight-bearing as tolerated, unless instructed otherwise. You may be weaned off of the walker or crutches by your Physical Therapist.  Continue doing gentle exercises. Exercising will reduce the pain and swelling, increase motion, and prevent muscle weakness.   Please continue to use the TED compression stockings for 2 weeks. You may remove the stockings at night, but should reapply them in the morning. Do not drive or operate any equipment until instructed.  WOUND CARE:  Continue to use ice packs periodically to reduce pain and swelling. You may shower with honeycomb dressing 3 days after your surgery. Do not submerge incision site under water. Remove honeycomb dressing 7 days after surgery and allow dermabond to fall off on its own.   MEDICATIONS: You may resume your regular medications. Please take the pain medication as prescribed on the medication list. Do not take pain medication on an empty stomach. You have been given a prescription for a blood thinner to prevent blood clots. Please take the medication as instructed. (NOTE: After completing a 2 week course of Lovenox, take one Enteric-coated 81 mg aspirin twice a day.) Pain medications and iron supplements can cause constipation. Use a stool softener (Senokot or Colace) on a daily basis and a laxative (dulcolax or miralax) as needed. Do not drive or drink alcoholic beverages when taking pain medications.  POSTOPERATIVE CONSTIPATION PROTOCOL Constipation - defined medically as fewer than three stools per week and severe constipation as  less than one stool per week.  One of the most common issues patients have following surgery is constipation.  Even if you have a regular bowel pattern at home, your normal regimen is likely to be disrupted due to multiple reasons following surgery.  Combination of anesthesia, postoperative narcotics, change in appetite and fluid intake all can affect your bowels.  In order to avoid complications following surgery, here are some recommendations in order to help you during your recovery period.  Colace (docusate) - Pick up an over-the-counter form of Colace or another stool softener and take twice a day as long as you are requiring postoperative pain medications.  Take with a full glass of water daily.  If you experience loose stools or diarrhea, hold the colace until you stool forms back up.  If your symptoms do not get better within 1 week or if they get worse, check with your doctor.  Dulcolax (bisacodyl) - Pick up over-the-counter and take as directed by the product packaging as needed to assist with the movement of your bowels.  Take with a full glass of water.  Use this product as needed if not relieved by Colace only.   MiraLax (polyethylene glycol) - Pick up over-the-counter to have on hand.  MiraLax is a solution that will increase the amount of water in your bowels to assist with bowel movements.  Take as directed and can mix with a glass of water, juice, soda, coffee, or tea.  Take if you go more than two days without a movement. Do not use MiraLax more than once per day. Call   your doctor if you are still constipated or irregular after using this medication for 7 days in a row.  If you continue to have problems with postoperative constipation, please contact the office for further assistance and recommendations.  If you experience "the worst abdominal pain ever" or develop nausea or vomiting, please contact the office immediatly for further recommendations for treatment.   CALL THE OFFICE  FOR: Temperature above 101 degrees Excessive bleeding or drainage on the dressing. Excessive swelling, coldness, or paleness of the toes. Persistent nausea and vomiting.  FOLLOW-UP:  You should have an appointment to return to the office in 2 weeks after surgery. Arrangements have been made for continuation of Physical Therapy (either home therapy or outpatient therapy).  

## 2023-01-09 NOTE — Progress Notes (Signed)
Physical Therapy Treatment Patient Details Name: Jane [name] MRN: [id number] DOB: 1961/11/15 Today's Date: 01/09/2023   History of Present Illness Pt is a 62 yo F diagnosed with L hip OA and is s/p elective L THA.  PMH includes fibromyalgia, HTN, and OA.    PT Comments    Pt was pleasant and motivated to participate during the session and put forth good effort throughout. Pt required no physical assistance during the session and demonstrated good control and stability throughout.  Pt ambulated with reciprocal pattern with only min decrease in cadence and step length.  Pt able to ascend and descend 4 steps with one rail with excellent stability and control.  No adverse symptoms reported during the session other than min L hip pain with SpO2 and HR WNL on room air.  Pt will benefit from HHPT upon discharge to safely address deficits listed in patient problem list for decreased caregiver assistance and eventual return to PLOF.      Recommendations for follow up therapy are one component of a multi-disciplinary discharge planning process, led by the attending physician.  Recommendations may be updated based on patient status, additional functional criteria and insurance authorization.  Follow Up Recommendations  Home health PT     Assistance Recommended at Discharge Intermittent Supervision/Assistance  Patient can return home with the following A little help with walking and/or transfers;A little help with bathing/dressing/bathroom;Assistance with cooking/housework;Help with stairs or ramp for entrance;Assist for transportation   Equipment Recommendations  None recommended by PT    Recommendations for Other Services       Precautions / Restrictions Precautions Precautions: Fall;Anterior Hip Precaution Booklet Issued: Yes (comment) Restrictions Weight Bearing Restrictions: Yes LLE Weight Bearing: Weight bearing as tolerated     Mobility  Bed Mobility               General  bed mobility comments: NT, pt found sitting at EOB    Transfers Overall transfer level: Needs assistance Equipment used: Rolling walker (2 wheels) Transfers: Sit to/from Stand Sit to Stand: Supervision           General transfer comment: Good eccentric and concentric control and stability    Ambulation/Gait Ambulation/Gait assistance: Supervision Gait Distance (Feet): 100 Feet Assistive device: Rolling walker (2 wheels) Gait Pattern/deviations: Step-through pattern, Decreased step length - right, Decreased step length - left Gait velocity: decreased     General Gait Details: Step-through pattern with no noted deficits other than mildly reduced cadence and bilateral step length; no antalgic pattern or instability noted   Stairs Stairs: Yes Stairs assistance: Min guard Stair Management: One rail Left, Step to pattern, Forwards Number of Stairs: 4 General stair comments: Pt able to ascend and descend 4 steps x 2 with min to mod verbal cues for sequencing and with good control and stability throughout   Wheelchair Mobility    Modified Rankin (Stroke Patients Only)       Balance Overall balance assessment: Needs assistance Sitting-balance support: Feet supported Sitting balance-Leahy Scale: Normal     Standing balance support: Bilateral upper extremity supported, During functional activity Standing balance-Leahy Scale: Good                              Cognition Arousal/Alertness: Awake/alert Behavior During Therapy: WFL for tasks assessed/performed Overall Cognitive Status: Within Functional Limits for tasks assessed  Exercises Total Joint Exercises Ankle Circles/Pumps: Strengthening, Both, 10 reps Long Arc Quad: Strengthening, Both, 10 reps Knee Flexion: Strengthening, Both, 10 reps Marching in Standing: Strengthening, Both, 5 reps, Standing Car transfer sequencing education provided     General Comments        Pertinent Vitals/Pain Pain Assessment Pain Assessment: 0-10 Pain Score: 2  Pain Location: L hip Pain Descriptors / Indicators: Sore Pain Intervention(s): Monitored during session, Premedicated before session, Repositioned, Ice applied    Home Living Family/patient expects to be discharged to:: Private residence Living Arrangements: Spouse/significant other Available Help at Discharge: Family;Available 24 hours/day Type of Home: House Home Access: Stairs to enter Entrance Stairs-Rails: Left Entrance Stairs-Number of Steps: 5   Home Layout: One level Home Equipment: Conservation officer, nature (2 wheels);Cane - single point;BSC/3in1;Adaptive equipment      Prior Function            PT Goals (current goals can now be found in the care plan section) Progress towards PT goals: Progressing toward goals    Frequency    BID      PT Plan Current plan remains appropriate    Co-evaluation              AM-PAC PT "6 Clicks" Mobility   Outcome Measure  Help needed turning from your back to your side while in a flat bed without using bedrails?: A Little Help needed moving from lying on your back to sitting on the side of a flat bed without using bedrails?: A Little Help needed moving to and from a bed to a chair (including a wheelchair)?: A Little Help needed standing up from a chair using your arms (e.g., wheelchair or bedside chair)?: A Little Help needed to walk in hospital room?: A Little Help needed climbing 3-5 steps with a railing? : A Little 6 Click Score: 18    End of Session Equipment Utilized During Treatment: Gait belt Activity Tolerance: Patient tolerated treatment well Patient left: in bed;with call bell/phone within reach;with nursing/sitter in room;Other (comment) (Pt left sitting at EOB as found per pt request) Nurse Communication: Mobility status PT Visit Diagnosis: Other abnormalities of gait and mobility (R26.89);Muscle weakness  (generalized) (M62.81);Pain Pain - Right/Left: Left Pain - part of body: Hip     Time: 4010-2725 PT Time Calculation (min) (ACUTE ONLY): 29 min  Charges:  $Gait Training: 23-37 mins                     D. Scott Kahlan Engebretson PT, DPT 01/09/23, 12:19 PM

## 2023-01-09 NOTE — Progress Notes (Addendum)
Subjective: 1 Day Post-Op Procedure(s) (LRB): TOTAL HIP ARTHROPLASTY ANTERIOR APPROACH (Left) Patient reports pain as mild.   Patient is well, and has had no acute complaints or problems Denies any CP, SOB, ABD pain. We will continue therapy today.  Plan is to go Home after hospital stay.  Objective: Vital signs in last 24 hours: Temp:  [97.1 F (36.2 C)-99 F (37.2 C)] 98.4 F (36.9 C) (02/06 0015) Pulse Rate:  [61-85] 82 (02/06 0015) Resp:  [15-23] 16 (02/06 0015) BP: (97-138)/(50-68) 120/60 (02/06 0015) SpO2:  [95 %-100 %] 97 % (02/06 0015) Weight:  [88.5 kg] 88.5 kg (02/05 0938)  Intake/Output from previous day: 02/05 0701 - 02/06 0700 In: 2518.3 [P.O.:560; I.V.:1475.8; IV Piggyback:482.5] Out: 2916 [Urine:2716; Blood:200] Intake/Output this shift: No intake/output data recorded.  No results for input(s): "HGB" in the last 72 hours. No results for input(s): "WBC", "RBC", "HCT", "PLT" in the last 72 hours. Recent Labs    01/09/23 0620  NA 138  K 3.3*  CL 106  CO2 25  BUN 11  CREATININE 0.57  GLUCOSE 100*  CALCIUM 7.9*   No results for input(s): "LABPT", "INR" in the last 72 hours.  EXAM General - Patient is Alert, Appropriate, and Oriented Extremity - Neurologically intact Neurovascular intact Sensation intact distally Intact pulses distally Dorsiflexion/Plantar flexion intact No cellulitis present Compartment soft Dressing - dressing C/D/I and no drainage Motor Function - intact, moving foot and toes well on exam.   Past Medical History:  Diagnosis Date   Allergy    Anemia    as a child   Anxiety    Arthritis    Knees, feet, elbows , neck   Asthma    mild; assoc with seasonal allergies; uses Albuterol once per year on average.   Atypical chest pain    hx    Complication of anesthesia    nausea, last 2 surgeries nausea much less   DM (diabetes mellitus), type 2 ([place])    Fibromyalgia 12/04/2010   [facility] in [place]; Dr.  Paschal Dopp; followed every six months.   GERD (gastroesophageal reflux disease)    Headache    Heart murmur    Mitral valve Prolapse   HLD (hyperlipidemia)    Hypertension    Hypothyroidism post radioactive iodine   Hyperthyroidism; now secondary hypothyroidism   MR (mitral regurgitation)    moderate; probably MVP. echo 3/10 at [facility] cardiology with normal EF, moderate MR    Obesity    OSA on CPAP    PONV (postoperative nausea and vomiting)    hard time waking   Pre-diabetes     Assessment/Plan:   1 Day Post-Op Procedure(s) (LRB): TOTAL HIP ARTHROPLASTY ANTERIOR APPROACH (Left) Principal Problem:   Osteoarthritis of left hip  Estimated body mass index is 30.54 kg/m as calculated from the following:   Height as of this encounter: 5\' 7"  (1.702 m).   Weight as of this encounter: 88.5 kg. Advance diet Up with therapy Discharge home with home health today pending safe completion of PT goals Pain well controlled Vital signs stable Labs stable Hypokalemia - K 3.3, will give oral supplement  DVT Prophylaxis - Lovenox, TED hose, and SCDs Weight-Bearing as tolerated to left leg   T. Rachelle Hora, PA-C [place] 01/09/2023, 7:42 AM   Patient seen and examined, agree with above plan.  The patient is doing well status post left anterior total hip arthroplasty, no concerns at this time.  Pain is controlled.  Discussed DVT prophylaxis, pain medication use, and safe transition to home.  All questions answered the patient agrees with above plan will go home after clears PT.   Steffanie Rainwater MD

## 2023-01-09 NOTE — TOC Progression Note (Signed)
Transition of Care [facility]) - Progression Note    Patient Details  Name: Jane [name] MRN: [id number] Date of Birth: 1961/03/25  Transition of Care [facility]) CM/SW [place], RN Phone Number: 01/09/2023, 9:59 AM  Clinical Narrative:    The patient reports to PT that she has  Conservation officer, nature (2 wheels);Cane - single point;BSC/3in1  at home and will not need additional DME  Expected Discharge Plan: [place] Barriers to Discharge: No Barriers Identified  Expected Discharge Plan and Services   Discharge Planning Services: CM Consult   Living arrangements for the past 2 months: Single Family Home                 DME Arranged: N/A         HH Arranged: PT HH Agency: [place] Date [place]: 01/09/23 Time [place]: [phone] Representative spoke with at [place]: [place]   Social Determinants of Health ([place]) Interventions SDOH Screenings   Food Insecurity: No Food Insecurity (01/08/2023)  Housing: Low Risk  (01/08/2023)  Transportation Needs: No Transportation Needs (01/08/2023)  Utilities: Not At Risk (01/08/2023)  Alcohol Screen: Low Risk  (08/02/2022)  Depression (PHQ2-9): Low Risk  (08/02/2022)  Financial Resource Strain: Low Risk  (08/02/2022)  Social Connections: Unknown (08/02/2022)  Stress: No Stress Concern Present (08/02/2022)  Tobacco Use: Medium Risk (01/08/2023)    Readmission Risk Interventions     No data to display

## 2023-01-09 NOTE — Evaluation (Signed)
Occupational Therapy Evaluation Patient Details Name: Jane [name] MRN: [id number] DOB: 09/04/1961 Today's Date: 01/09/2023   History of Present Illness Pt is a 62 yo F diagnosed with L hip OA and is s/p elective L THA.  PMH includes fibromyalgia, HTN, and OA.   Clinical Impression   Patient seen for OT evaluation. PTA pt lived with spouse, was independent for ADLs/IADLs, and independent for functional mobility without an AD. Pt was educated on LLE WBAT, anterior hip precautions, adaptive strategies for dressing including use of AE (reacher, sock aid), fall safety, and use of shower seat for bathing. Pt verbalized understanding of all education provided. Pt engaged in full body dressing this date. She required set up-supervision for seated UB dressing, set up-supervision for seated LB dressing using AE, and Min guard for safety to hike underwear/pants in standing. Pt already has a reacher and will have assistance from spouse upon discharge as needed for LB dressing. Pt will benefit from acute OT to increase overall independence in the areas of ADLs and functional mobility in order to safely discharge home. Pt could benefit from [facility] following D/C to decrease falls risk, improve balance, and maximize independence in self-care within own home environment.    Recommendations for follow up therapy are one component of a multi-disciplinary discharge planning process, led by the attending physician.  Recommendations may be updated based on patient status, additional functional criteria and insurance authorization.   Follow Up Recommendations  Home health OT     Assistance Recommended at Discharge Intermittent Supervision/Assistance  Patient can return home with the following A little help with walking and/or transfers;A little help with bathing/dressing/bathroom;Assistance with cooking/housework;Assist for transportation;Help with stairs or ramp for entrance    Functional Status Assessment  Patient  has had a recent decline in their functional status and demonstrates the ability to make significant improvements in function in a reasonable and predictable amount of time.  Equipment Recommendations  Tub/shower seat    Recommendations for Other Services       Precautions / Restrictions Precautions Precautions: Fall;Anterior Hip Precaution Booklet Issued: Yes (comment) Restrictions Weight Bearing Restrictions: Yes LLE Weight Bearing: Weight bearing as tolerated      Mobility Bed Mobility Overal bed mobility: Needs Assistance Bed Mobility: Supine to Sit     Supine to sit: Supervision, HOB elevated          Transfers Overall transfer level: Needs assistance Equipment used: Rolling walker (2 wheels) Transfers: Sit to/from Stand Sit to Stand: Supervision                  Balance Overall balance assessment: Needs assistance Sitting-balance support: Feet supported Sitting balance-Leahy Scale: Normal     Standing balance support: Bilateral upper extremity supported, During functional activity, Single extremity supported Standing balance-Leahy Scale: Fair                             ADL either performed or assessed with clinical judgement   ADL Overall ADL's : Needs assistance/impaired                 Upper Body Dressing : Set up;Sitting Upper Body Dressing Details (indicate cue type and reason): to doff gown and don t-shirt Lower Body Dressing: Set up;Sitting/lateral leans;Sit to/from stand;Supervision/safety;With adaptive equipment;Min guard Lower Body Dressing Details (indicate cue type and reason): to don socks, shoes, pants using AE PRN (reacher, sock aid). able to hike in standing with Min  guard for safety Toilet Transfer: Rolling walker (2 wheels);Supervision/safety Armed forces technical officer Details (indicate cue type and reason): simulated                 Vision Baseline Vision/History: 1 Wears glasses Patient Visual Report: No change  from baseline       Perception     Praxis      Pertinent Vitals/Pain Pain Assessment Pain Assessment: Faces Faces Pain Scale: Hurts a little bit Pain Location: L hip Pain Descriptors / Indicators: Aching, Sore Pain Intervention(s): Monitored during session, Repositioned, Ice applied     Hand Dominance Right   Extremity/Trunk Assessment Upper Extremity Assessment Upper Extremity Assessment: Overall WFL for tasks assessed   Lower Extremity Assessment Lower Extremity Assessment: LLE deficits/detail LLE Deficits / Details: s/p THA   Cervical / Trunk Assessment Cervical / Trunk Assessment: Normal   Communication Communication Communication: No difficulties   Cognition Arousal/Alertness: Awake/alert Behavior During Therapy: WFL for tasks assessed/performed Overall Cognitive Status: Within Functional Limits for tasks assessed                                       General Comments       Exercises Other Exercises Other Exercises: OT provided education re: role of OT, OT POC, post acute recs, sitting up for all meals, EOB/OOB mobility with assistance, home/fall safety, LLE WBAT, LB dressing AE (reacher, sock aid), use of shower chair for bathing, handout provided    Shoulder Instructions      Home Living Family/patient expects to be discharged to:: Private residence Living Arrangements: Spouse/significant other Available Help at Discharge: Family;Available 24 hours/day Type of Home: House Home Access: Stairs to enter [employer] of Steps: 5 Entrance Stairs-Rails: Left Home Layout: One level     Bathroom Shower/Tub: Tub/shower unit;Walk-in shower   Bathroom Toilet: Handicapped height     Home Equipment: Conservation officer, nature (2 wheels);Cane - single point;BSC/3in1;Adaptive equipment Adaptive Equipment: Reacher        Prior Functioning/Environment Prior Level of Function : Independent/Modified Independent;Driving             Mobility  Comments: Ind amb community distances without an AD, no fall history ADLs Comments: Ind with ADLs/IADLs        OT Problem List: Decreased strength;Decreased activity tolerance;Impaired balance (sitting and/or standing);Decreased knowledge of use of DME or AE;Decreased knowledge of precautions;Pain      OT Treatment/Interventions: Self-care/ADL training;Therapeutic exercise;Therapeutic activities;Energy conservation;DME and/or AE instruction;Patient/family education;Balance training    OT Goals(Current goals can be found in the care plan section) Acute Rehab OT Goals Patient Stated Goal: return home OT Goal Formulation: With patient Time For Goal Achievement: 01/23/23 Potential to Achieve Goals: Good   OT Frequency: Min 2X/week    Co-evaluation              AM-PAC OT "6 Clicks" Daily Activity     Outcome Measure Help from another person eating meals?: None Help from another person taking care of personal grooming?: None Help from another person toileting, which includes using toliet, bedpan, or urinal?: A Little Help from another person bathing (including washing, rinsing, drying)?: A Little Help from another person to put on and taking off regular upper body clothing?: None Help from another person to put on and taking off regular lower body clothing?: A Little 6 Click Score: 21   End of Session Equipment Utilized During Treatment: Rolling walker (  2 wheels);Gait belt Nurse Communication: Mobility status;Precautions;Weight bearing status  Activity Tolerance: Patient tolerated treatment well Patient left: in bed;with call bell/phone within reach;with bed alarm set  OT Visit Diagnosis: Unsteadiness on feet (R26.81);Muscle weakness (generalized) (M62.81);Pain Pain - Right/Left: Left Pain - part of body: Hip                Time: 7482-7078 OT Time Calculation (min): 20 min Charges:  OT General Charges $OT Visit: 1 Visit OT Evaluation $OT Eval Moderate Complexity: 1  Mod  [facility] MS, OTR/L ascom [phone]  01/09/23, 12:07 PM

## 2023-01-10 ENCOUNTER — Emergency Department
Admission: EM | Admit: 2023-01-10 | Discharge: 2023-01-10 | Disposition: A | Discharge status: Home / Self Care | Payer: Federal, State, Local not specified - PPO | Attending: Emergency Medicine | Admitting: Emergency Medicine

## 2023-01-10 ENCOUNTER — Other Ambulatory Visit: Payer: Self-pay

## 2023-01-10 DIAGNOSIS — J45909 Unspecified asthma, uncomplicated: Secondary | ICD-10-CM | POA: Insufficient documentation

## 2023-01-10 DIAGNOSIS — Z79899 Other long term (current) drug therapy: Secondary | ICD-10-CM | POA: Diagnosis not present

## 2023-01-10 DIAGNOSIS — Z96653 Presence of artificial knee joint, bilateral: Secondary | ICD-10-CM | POA: Insufficient documentation

## 2023-01-10 DIAGNOSIS — E119 Type 2 diabetes mellitus without complications: Secondary | ICD-10-CM | POA: Insufficient documentation

## 2023-01-10 DIAGNOSIS — I951 Orthostatic hypotension: Secondary | ICD-10-CM

## 2023-01-10 DIAGNOSIS — I10 Essential (primary) hypertension: Secondary | ICD-10-CM | POA: Insufficient documentation

## 2023-01-10 DIAGNOSIS — R55 Syncope and collapse: Secondary | ICD-10-CM | POA: Diagnosis present

## 2023-01-10 DIAGNOSIS — Z96642 Presence of left artificial hip joint: Secondary | ICD-10-CM | POA: Diagnosis not present

## 2023-01-10 DIAGNOSIS — E039 Hypothyroidism, unspecified: Secondary | ICD-10-CM | POA: Diagnosis not present

## 2023-01-10 LAB — COMPREHENSIVE METABOLIC PANEL
ALT: 16 U/L (ref 0–44)
AST: 29 U/L (ref 15–41)
Albumin: 3.1 g/dL — ABNORMAL LOW (ref 3.5–5.0)
Alkaline Phosphatase: 53 U/L (ref 38–126)
Anion gap: 7 (ref 5–15)
BUN: 12 mg/dL (ref 8–23)
CO2: 26 mmol/L (ref 22–32)
Calcium: 8.1 mg/dL — ABNORMAL LOW (ref 8.9–10.3)
Chloride: 104 mmol/L (ref 98–111)
Creatinine, Ser: 0.53 mg/dL (ref 0.44–1.00)
GFR, Estimated: >60 mL/min (ref >60)
Glucose, Bld: 115 mg/dL — ABNORMAL HIGH (ref 70–99)
Potassium: 3.4 mmol/L — ABNORMAL LOW (ref 3.5–5.1)
Sodium: 137 mmol/L (ref 135–145)
Total Bilirubin: 0.7 mg/dL (ref 0.3–1.2)
Total Protein: 5.7 g/dL — ABNORMAL LOW (ref 6.5–8.1)

## 2023-01-10 LAB — CBC WITH DIFFERENTIAL/PLATELET
Abs Immature Granulocytes: 0.05 10*3/uL (ref 0.00–0.07)
Basophils Absolute: 0 10*3/uL (ref 0.0–0.1)
Basophils Relative: 0 %
Eosinophils Absolute: 0.1 10*3/uL (ref 0.0–0.5)
Eosinophils Relative: 1 %
HCT: 33.2 % — ABNORMAL LOW (ref 36.0–46.0)
Hemoglobin: 10.8 g/dL — ABNORMAL LOW (ref 12.0–15.0)
Immature Granulocytes: 0 %
Lymphocytes Relative: 7 %
Lymphs Abs: 0.8 10*3/uL (ref 0.7–4.0)
MCH: 28.8 pg (ref 26.0–34.0)
MCHC: 32.5 g/dL (ref 30.0–36.0)
MCV: 88.5 fL (ref 80.0–100.0)
Monocytes Absolute: 0.7 10*3/uL (ref 0.1–1.0)
Monocytes Relative: 6 %
Neutro Abs: 9.9 10*3/uL — ABNORMAL HIGH (ref 1.7–7.7)
Neutrophils Relative %: 86 %
Platelets: 186 10*3/uL (ref 150–400)
RBC: 3.75 MIL/uL — ABNORMAL LOW (ref 3.87–5.11)
RDW: 13.5 % (ref 11.5–15.5)
WBC: 11.6 10*3/uL — ABNORMAL HIGH (ref 4.0–10.5)
nRBC: 0 % (ref 0.0–0.2)

## 2023-01-10 LAB — URINALYSIS, ROUTINE W REFLEX MICROSCOPIC
Bilirubin Urine: NEGATIVE
Glucose, UA: NEGATIVE mg/dL
Hgb urine dipstick: NEGATIVE
Ketones, ur: NEGATIVE mg/dL
Leukocytes,Ua: NEGATIVE
Nitrite: NEGATIVE
Protein, ur: NEGATIVE mg/dL
Specific Gravity, Urine: 1.009 (ref 1.005–1.030)
pH: 7 (ref 5.0–8.0)

## 2023-01-10 LAB — TROPONIN I (HIGH SENSITIVITY)
Troponin I (High Sensitivity): 3 ng/L (ref <18)
Troponin I (High Sensitivity): 3 ng/L (ref <18)

## 2023-01-10 LAB — SURGICAL PATHOLOGY

## 2023-01-10 LAB — MAGNESIUM: Magnesium: 2 mg/dL (ref 1.7–2.4)

## 2023-01-10 MED ORDER — POTASSIUM CHLORIDE CRYS ER 20 MEQ PO TBCR
40.0000 meq | EXTENDED_RELEASE_TABLET | Freq: Once | ORAL | Status: AC
  Administered 2023-01-10: 40 meq via ORAL
  Filled 2023-01-10: qty 2

## 2023-01-10 MED ORDER — FENTANYL CITRATE PF 50 MCG/ML IJ SOSY
50.0000 ug | PREFILLED_SYRINGE | Freq: Once | INTRAMUSCULAR | Status: AC
  Administered 2023-01-10: 50 ug via INTRAVENOUS
  Filled 2023-01-10: qty 1

## 2023-01-10 MED ORDER — ONDANSETRON HCL 4 MG/2ML IJ SOLN
4.0000 mg | Freq: Once | INTRAMUSCULAR | Status: AC
  Administered 2023-01-10: 4 mg via INTRAVENOUS
  Filled 2023-01-10: qty 2

## 2023-01-10 MED ORDER — SODIUM CHLORIDE 0.9 % IV BOLUS (SEPSIS)
1000.0000 mL | Freq: Once | INTRAVENOUS | Status: AC
  Administered 2023-01-10: 1000 mL via INTRAVENOUS

## 2023-01-10 NOTE — ED Notes (Signed)
Pt ambulated to restroom with use of walker.  Pt steady on feet with walker. Pt denies dizziness while ambulating.

## 2023-01-10 NOTE — Anesthesia Postprocedure Evaluation (Signed)
Anesthesia Post Note  Patient: Jane [name]  Procedure(s) Performed: TOTAL HIP ARTHROPLASTY ANTERIOR APPROACH (Left: Hip)  Patient location during evaluation: PACU Anesthesia Type: General Level of consciousness: awake Pain management: satisfactory to patient Vital Signs Assessment: post-procedure vital signs reviewed and stable Respiratory status: nonlabored ventilation and respiratory function stable Cardiovascular status: stable Anesthetic complications: no  No notable events documented.   Last Vitals:  Vitals:   01/09/23 0754 01/09/23 1021  BP: (!) 120/52 (!) 129/53  Pulse: 80 85  Resp: 16 16  Temp: 36.9 C 37 C  SpO2: 96% 96%    Last Pain:  Vitals:   01/09/23 1021  TempSrc:   PainSc: 0-No pain                 VAN STAVEREN,Oni Dietzman

## 2023-01-10 NOTE — ED Triage Notes (Signed)
Pt presents to ER via ems from home with c/o syncopal episode tonight while trying to get up OOB to use bathroom. Ems states pt slid out of bed, guided by her s/o and had appx 4-5 min LOC.  Pt states she does not remember losing consciousness.  Pt was released form hospital yesterday after hip surgery.  Endorses some pain from her left hip.  Pt is otherwise A&O x4 and in NAD on arrival.

## 2023-01-10 NOTE — ED Provider Notes (Signed)
[facility] Provider Note    Event Date/Time   First MD Initiated Contact with Patient 01/10/23 [phone]     (approximate)   History   Loss of Consciousness   HPI  Jane [name] is a 62 y.o. female with history of hypertension, hypothyroidism, mitral regurgitation who underwent left total hip arthroplasty with Dr. Karel Jarvis on 01/08/2023 who presents to the emergency department after a syncopal event at home.  States that she got up to go to the bathroom and felt very dizzy, lightheaded and passed out.  No seizure-like activity, tongue biting or incontinence.  Husband was able to guide her down to the ground she did not hit her head.  She is not on blood thinners.  She denies any preceding chest pain, shortness of breath or palpitations.  She states a similar episode happened while in the hospital right after her surgery.  EMS reports she was orthostatic for them and had a blood sugar of 121.  They gave 4 mg of IV Zofran and 800 mL of lactated Ringer's IV fluids.   History provided by patient, EMS.    Past Medical History:  Diagnosis Date   Allergy    Anemia    as a child   Anxiety    Arthritis    Knees, feet, elbows , neck   Asthma    mild; assoc with seasonal allergies; uses Albuterol once per year on average.   Atypical chest pain    hx    Complication of anesthesia    nausea, last 2 surgeries nausea much less   DM (diabetes mellitus), type 2 ([place])    Fibromyalgia 12/04/2010   [facility] in [place]; Dr. Paschal Dopp; followed every six months.   GERD (gastroesophageal reflux disease)    Headache    Heart murmur    Mitral valve Prolapse   HLD (hyperlipidemia)    Hypertension    Hypothyroidism post radioactive iodine   Hyperthyroidism; now secondary hypothyroidism   MR (mitral regurgitation)    moderate; probably MVP. echo 3/10 [facility] cardiology with normal EF, moderate MR    Obesity    OSA on CPAP    PONV (postoperative nausea and  vomiting)    hard time waking   Pre-diabetes     Past Surgical History:  Procedure Laterality Date   APPLICATION OF WOUND VAC  10/05/2021   Procedure: APPLICATION OF WOUND VAC;  Surgeon: Meade Maw, MD;  Location: [facility] ORS;  Service: Neurosurgery;;  prevena plus wound vac    COLONOSCOPY  12/04/2009   normal; [place]   COLONOSCOPY WITH PROPOFOL N/A 08/09/2018   Procedure: COLONOSCOPY WITH PROPOFOL;  Surgeon: Virgel Manifold, MD;  Location: [facility] ENDOSCOPY;  Service: Endoscopy;  Laterality: N/A;   ESOPHAGOGASTRODUODENOSCOPY (EGD) WITH PROPOFOL N/A 08/09/2018   Procedure: ESOPHAGOGASTRODUODENOSCOPY (EGD) WITH PROPOFOL;  Surgeon: Virgel Manifold, MD;  Location: [facility] ENDOSCOPY;  Service: Endoscopy;  Laterality: N/A;   JOINT REPLACEMENT     KNEE ARTHROPLASTY Right 10/25/2016   Procedure: COMPUTER ASSISTED TOTAL KNEE ARTHROPLASTY;  Surgeon: Dereck Leep, MD;  Location: [facility] ORS;  Service: Orthopedics;  Laterality: Right;   KNEE SURGERY  06/2009   LAPAROSCOPY  1984   for endomeriosis   left foot surgery     for plantar fascitis   LUMBAR LAMINECTOMY/DECOMPRESSION MICRODISCECTOMY N/A 10/05/2021   Procedure: L3-5 DECOMPRESSION;  Surgeon: Meade Maw, MD;  Location: [facility] ORS;  Service: Neurosurgery;  Laterality: N/A;   MAXIMUM ACCESS (MAS) TRANSFORAMINAL  LUMBAR INTERBODY FUSION (TLIF) 1 LEVEL N/A 10/05/2021   Procedure: L5-S1 TRANSFORAMINAL LUMBAR INTERBODY FUSION (TLIF);  Surgeon: Venetia Night, MD;  Location: [facility] ORS;  Service: Neurosurgery;  Laterality: N/A;   novascue uterine ablation  2007   REPLACEMENT TOTAL KNEE Left    TOTAL HIP ARTHROPLASTY Left 01/08/2023   Procedure: TOTAL HIP ARTHROPLASTY ANTERIOR APPROACH;  Surgeon: Reinaldo Berber, MD;  Location: [facility] ORS;  Service: Orthopedics;  Laterality: Left;    MEDICATIONS:  Prior to Admission medications   Medication Sig Start Date End Date Taking? Authorizing Provider  acetaminophen (TYLENOL) 500 MG tablet  Take 2 tablets (1,000 mg total) by mouth every 8 (eight) hours. 01/09/23   Evon Slack, PA-C  clonazePAM (KLONOPIN) 0.5 MG tablet Take 0.5-1 tablets (0.25-0.5 mg total) by mouth 2 (two) times daily as needed for anxiety. Patient taking differently: Take 0.25 mg by mouth 2 (two) times daily as needed for anxiety. 01/07/21   [name], PA-C  docusate sodium (COLACE) 100 MG capsule Take 1 capsule (100 mg total) by mouth 2 (two) times daily. 01/09/23   Evon Slack, PA-C  enoxaparin (LOVENOX) 40 MG/0.4ML injection Inject 0.4 mLs (40 mg total) into the skin daily for 14 days. 01/09/23 01/23/23  Evon Slack, PA-C  hydrochlorothiazide (MICROZIDE) 12.5 MG capsule Take 12.5 mg by mouth every morning.    [provider]  HYDROcodone-acetaminophen (NORCO/VICODIN) 5-325 MG tablet Take 1 tablet by mouth every 8 (eight) hours as needed (severe breakthrough pain). 01/09/23   Evon Slack, PA-C  levothyroxine (SYNTHROID) 112 MCG tablet TAKE 1 TABLET BY MOUTH DAILY 30 MINUTES BEFORE BREAKFAST Patient taking differently: Take 112 mcg by mouth daily before breakfast. TAKE 1 TABLET BY MOUTH DAILY 30 MINUTES BEFORE BREAKFAST 01/29/21   Joycelyn Man M, PA-C  losartan (COZAAR) 100 MG tablet TAKE 1 TABLET BY MOUTH DAILY Patient taking differently: Take 100 mg by mouth at bedtime. 05/11/21   Chrismon, Jodell Cipro, PA-C  meloxicam (MOBIC) 15 MG tablet Take 1 tablet (15 mg total) by mouth daily for 14 days. 01/09/23 01/23/23  Evon Slack, PA-C  montelukast (SINGULAIR) 10 MG tablet TAKE 1 TABLET AT BEDTIME Patient taking differently: Take 10 mg by mouth at bedtime. 01/06/22   Erasmo Downer, MD  omeprazole (PRILOSEC) 20 MG capsule TAKE 1 CAPSULE BY MOUTH TWICE DAILY BEFORE MEALS Patient taking differently: 20 mg every morning. And prn 06/29/21   Erasmo Downer, MD  ondansetron (ZOFRAN) 4 MG tablet Take 1 tablet (4 mg total) by mouth every 6 (six) hours as needed for nausea. 01/09/23   Evon Slack, PA-C  polyethylene glycol (MIRALAX / GLYCOLAX) 17 g packet Take 17 g by mouth daily as needed for mild constipation. 10/09/21   Venetia Night, MD  rosuvastatin (CRESTOR) 10 MG tablet Take 10 mg by mouth at bedtime.    [provider]  Semaglutide, 1 MG/DOSE, (OZEMPIC, 1 MG/DOSE,) 2 MG/1.5ML SOPN Inject 1 mg into the skin once a week. Fridays    [provider]  traMADol (ULTRAM) 50 MG tablet Take 1 tablet (50 mg total) by mouth every 6 (six) hours as needed for moderate pain. 01/09/23   Evon Slack, PA-C  venlafaxine XR (EFFEXOR-XR) 150 MG 24 hr capsule TAKE 1 CAPSULE BY MOUTH DAILY WITH BREAKFAST. Patient taking differently: 150 mg daily with breakfast. 07/12/21   Chrismon, Jodell Cipro, PA-C    Physical Exam   Triage Vital Signs: ED Triage Vitals  Enc  Vitals Group     BP 01/10/23 0337 (!) 147/65     Pulse Rate 01/10/23 0337 82     Resp 01/10/23 0337 19     Temp 01/10/23 0337 98 F (36.7 C)     Temp Source 01/10/23 0337 Oral     SpO2 01/10/23 0337 100 %     Weight --      Height --      Head Circumference --      Peak Flow --      Pain Score 01/10/23 0339 4     Pain Loc --      Pain Edu? --      Excl. in [place]? --     Most recent vital signs: Vitals:   01/10/23 0345 01/10/23 0400  BP: 133/71 132/72  Pulse: 76 76  Resp: (!) 22 18  Temp:    SpO2: 98% 96%    CONSTITUTIONAL: Alert, responds appropriately to questions. Well-appearing; well-nourished HEAD: Normocephalic, atraumatic EYES: Conjunctivae clear, pupils appear equal, sclera nonicteric ENT: normal nose; moist mucous membranes NECK: Supple, normal ROM CARD: RRR; S1 and S2 appreciated RESP: Normal chest excursion without splinting or tachypnea; breath sounds clear and equal bilaterally; no wheezes, no rhonchi, no rales, no hypoxia or respiratory distress, speaking full sentences ABD/GI: Non-distended; soft, non-tender, no rebound, no guarding, no peritoneal signs BACK: The back appears  normal EXT: Normal ROM in all joints; no deformity noted, no edema, compartments soft, extremities warm and well-perfused, no calf tenderness or calf swelling SKIN: Normal color for age and race; warm; no rash on exposed skin NEURO: Moves all extremities equally, normal speech PSYCH: The patient's mood and manner are appropriate.   ED Results / Procedures / Treatments   LABS: (all labs ordered are listed, but only abnormal results are displayed) Labs Reviewed  CBC WITH DIFFERENTIAL/PLATELET - Abnormal; Notable for the following components:      Result Value   WBC 11.6 (*)    RBC 3.75 (*)    Hemoglobin 10.8 (*)    HCT 33.2 (*)    Neutro Abs 9.9 (*)    All other components within normal limits  COMPREHENSIVE METABOLIC PANEL - Abnormal; Notable for the following components:   Potassium 3.4 (*)    Glucose, Bld 115 (*)    Calcium 8.1 (*)    Total Protein 5.7 (*)    Albumin 3.1 (*)    All other components within normal limits  URINALYSIS, ROUTINE W REFLEX MICROSCOPIC - Abnormal; Notable for the following components:   Color, Urine STRAW (*)    APPearance CLEAR (*)    All other components within normal limits  MAGNESIUM  TROPONIN I (HIGH SENSITIVITY)  TROPONIN I (HIGH SENSITIVITY)     EKG:    Date: 01/10/2023 3:39 AM  Rate: 78  Rhythm: normal sinus rhythm  QRS Axis: normal  Intervals: normal  ST/T Wave abnormalities: normal  Conduction Disutrbances: none  Narrative Interpretation: unremarkable     RADIOLOGY: My personal review and interpretation of imaging:    I have personally reviewed all radiology reports.   No results found.   PROCEDURES:  Critical Care performed: No    .1-3 Lead EKG Interpretation  Performed by: Malek Skog, Delice Bison, DO Authorized by: Olanda Boughner, Delice Bison, DO     Interpretation: normal     ECG rate:  82   ECG rate assessment: normal     Rhythm: sinus rhythm     Ectopy: none  Conduction: normal       IMPRESSION / MDM / ASSESSMENT  AND PLAN / ED COURSE  I reviewed the triage vital signs and the nursing notes.    Patient here with syncopal event at home.  Orthostatic with EMS.  The patient is on the cardiac monitor to evaluate for evidence of arrhythmia and/or significant heart rate changes.   DIFFERENTIAL DIAGNOSIS (includes but not limited to):   Orthostasis, vasovagal syncope, anemia, electrolyte derangement, UTI, dehydration, less likely ACS, PE, arrhythmia   Patient's presentation is most consistent with acute presentation with potential threat to life or bodily function.   PLAN: Will place on cardiac monitor.  Will obtain EKG, CBC, CMP, urinalysis, troponin x 2, magnesium.  Will give IV fluids.  She is complaining of some pain in the hip.  Will give pain and nausea medicine.   MEDICATIONS GIVEN IN ED: Medications  sodium chloride 0.9 % bolus 1,000 mL (0 mLs Intravenous Stopped 01/10/23 0435)  fentaNYL (SUBLIMAZE) injection 50 mcg (50 mcg Intravenous Given 01/10/23 0345)  ondansetron (ZOFRAN) injection 4 mg (4 mg Intravenous Given 01/10/23 0344)  potassium chloride SA (KLOR-CON M) CR tablet 40 mEq (40 mEq Oral Given 01/10/23 0505)     ED COURSE: EKG nonischemic without arrhythmia.  Normal intervals.   Troponin x 2 negative.  Stable hemoglobin.  Normal electrolytes.  Urine shows no ketones.  Patient feeling better, ambulating.  Eating and drinking.  Husband at bedside.  Discussed importance of changing positions slowly and increase fluid intake at home.  Patient comfortable with this plan.   At this time, I do not feel there is any life-threatening condition present. I reviewed all nursing notes, vitals, pertinent previous records.  All lab and urine results, EKGs, imaging ordered have been independently reviewed and interpreted by myself.  I reviewed all available radiology reports from any imaging ordered this visit.  Based on my assessment, I feel the patient is safe to be discharged home without further  emergent workup and can continue workup as an outpatient as needed. Discussed all findings, treatment plan as well as usual and customary return precautions.  They verbalize understanding and are comfortable with this plan.  Outpatient follow-up has been provided as needed.  All questions have been answered.   CONSULTS:  none   OUTSIDE RECORDS REVIEWED: Reviewed patient's recent operative note on 01/08/2023.       FINAL CLINICAL IMPRESSION(S) / ED DIAGNOSES   Final diagnoses:  Syncope, unspecified syncope type  Orthostasis     Rx / DC Orders   ED Discharge Orders     None        Note:  This document was prepared using Dragon voice recognition software and may include unintentional dictation errors.   Carlita Whitcomb, Delice Bison, DO 01/10/23 [phone]

## 2023-05-24 ENCOUNTER — Other Ambulatory Visit: Payer: Self-pay

## 2023-05-24 ENCOUNTER — Encounter
Admission: RE | Admit: 2023-05-24 | Discharge: 2023-05-24 | Disposition: A | Discharge status: Home / Self Care | Payer: Federal, State, Local not specified - PPO | Source: Ambulatory Visit | Attending: Specialist | Admitting: Specialist

## 2023-05-24 ENCOUNTER — Encounter: Payer: Self-pay | Admitting: Specialist

## 2023-05-24 HISTORY — DX: Vitamin D deficiency, unspecified: E55.9

## 2023-05-24 HISTORY — DX: Other intervertebral disc degeneration, lumbar region without mention of lumbar back pain or lower extremity pain: M51.369

## 2023-05-24 HISTORY — DX: Depression, unspecified: F32.A

## 2023-05-24 NOTE — Pre-Procedure Instructions (Signed)
As of this time, I have not seen any surgical order set yet. Dr. Rondel Baton office was notified. Patient pre-op in person visit completed.

## 2023-05-24 NOTE — Patient Instructions (Addendum)
Your procedure is scheduled on: 05/31/2023 Report to the Registration Desk on the 1st floor of the [facility]. To find out your arrival time, please call [phone] between 1PM - 3PM on: 6/26/ 2024  If your arrival time is 6:00 am, do not arrive before that time as the [facility] entrance doors do not open until 6:00 am.  REMEMBER: Instructions that are not followed completely may result in serious medical risk, up to and including death; or upon the discretion of your surgeon and anesthesiologist your surgery may need to be rescheduled.  Do not eat food after midnight the night before surgery.  No gum chewing or hard candies.    One week prior to surgery: Stop Anti-inflammatories (NSAIDS) such as Advil, Aleve, Ibuprofen, Motrin, Naproxen, Naprosyn and Aspirin based products such as Excedrin, Goody's Powder, BC Powder. Stop ANY OVER THE COUNTER supplements until after surgery. You may however, continue to take Tylenol if needed for pain up until the day of surgery.  Continue taking all prescribed medications with the exception of the following:     Ozempic- Hold for 7 days prior to surgery    Follow recommendations from Cardiologist or PCP regarding stopping blood thinners.  TAKE ONLY THESE MEDICATIONS THE MORNING OF SURGERY WITH A SIP OF WATER:  buPROPion (WELLBUTRIN XL)  busPIRone (BUSPAR)  evothyroxine (SYNTHROID)  omeprazole (PRILOSEC) (take one the night before and one on the morning of surgery - helps to prevent nausea after surgery.) venlafaxine XR (EFFEXOR-XR)    No Alcohol for 24 hours before or after surgery.  No Smoking including e-cigarettes for 24 hours before surgery.  No chewable tobacco products for at least 6 hours before surgery.  No nicotine patches on the day of surgery.  Do not use any "recreational" drugs for at least a week (preferably 2 weeks) before your surgery.  Please be advised that the combination of cocaine and anesthesia may have  negative outcomes, up to and including death. If you test positive for cocaine, your surgery will be cancelled.  On the morning of surgery brush your teeth with toothpaste and water, you may rinse your mouth with mouthwash if you wish. Do not swallow any toothpaste or mouthwash.  Use CHG Soap as directed on instruction sheet.  Do not wear jewelry, make-up, hairpins, clips or nail polish.  Do not wear lotions, powders, or perfumes.   Do not shave body hair from the neck down 48 hours before surgery.  Contact lenses, hearing aids and dentures may not be worn into surgery.  Do not bring valuables to the hospital. [facility] is not responsible for any missing/lost belongings or valuables.  .  Bring your C-PAP to the hospital in case you may have to spend the night.   Notify your doctor if there is any change in your medical condition (cold, fever, infection).  Wear comfortable clothing (specific to your surgery type) to the hospital.  After surgery, you can help prevent lung complications by doing breathing exercises.  Take deep breaths and cough every 1-2 hours. Your doctor may order a device called an Incentive Spirometer to help you take deep breaths.   If you are being admitted to the hospital overnight, leave your suitcase in the car. After surgery it may be brought to your room.  If you are being discharged the day of surgery, you will not be allowed to drive home. You will need a responsible individual to drive you home and stay with you for 24  hours after surgery.   Please call the Pre-admissions Testing Dept. at [phone] if you have any questions about these instructions.  Surgery Visitation Policy:  Patients having surgery or a procedure may have two visitors.  Children under the age of 63 must have an adult with them who is not the patient.  Inpatient Visitation:    Visiting hours are 7 a.m. to 8 p.m. Up to four visitors are allowed at one time in a patient  room. The visitors may rotate out with other people during the day.  One visitor age 63 or older may stay with the patient overnight and must be in the room by 8 p.m.

## 2023-05-30 ENCOUNTER — Other Ambulatory Visit: Payer: Self-pay | Admitting: Specialist

## 2023-05-30 NOTE — H&P (Signed)
PREOPERATIVE H&P  Chief Complaint: Osteoarthrosis of the carpometacarpal joint of the left thumb  HPI: Jane [name] is a 62 y.o. female who presents for preoperative history and physical with a diagnosis of Osteoarthrosis of the carpometacarpal joint of the left thumb. Symptoms are rated as moderate to severe, and have been worsening.  She has failed conservative treatment with splinting, NSAIDs, and injections.  This is significantly impairing activities of daily living.  She has elected for surgical management.   Past Medical History:  Diagnosis Date   Allergy    Anemia    as a child   Anxiety    Arthritis    Knees, feet, elbows , neck   Asthma    mild; assoc with seasonal allergies; uses Albuterol once per year on average.   Atypical chest pain    hx    Complication of anesthesia    nausea, last 2 surgeries nausea much less   Depression    DM (diabetes mellitus), type 2 (HCC)    Fibromyalgia 12/04/2010   [facility] in [place]; Dr. Gwenevere Ghazi; followed every six months.   GERD (gastroesophageal reflux disease)    Headache    Heart murmur    Mitral valve Prolapse   HLD (hyperlipidemia)    Hypertension    Hypothyroidism post radioactive iodine   Hyperthyroidism; now secondary hypothyroidism   Lumbar degenerative disc disease    MR (mitral regurgitation)    moderate; probably MVP. echo 3/10 at [facility] cardiology with normal EF, moderate MR    Obesity    OSA on CPAP    Osteoarthritis of carpometacarpal joint    thumb   PONV (postoperative nausea and vomiting)    hard time waking   Pre-diabetes    Vitamin D deficiency    Past Surgical History:  Procedure Laterality Date   APPLICATION OF WOUND VAC  10/05/2021   Procedure: APPLICATION OF WOUND VAC;  Surgeon: Venetia Night, MD;  Location: [facility] ORS;  Service: Neurosurgery;;  prevena plus wound vac    COLONOSCOPY  12/04/2009   normal; Iftikhar   COLONOSCOPY WITH PROPOFOL N/A 08/09/2018   Procedure:  COLONOSCOPY WITH PROPOFOL;  Surgeon: Pasty Spillers, MD;  Location: [facility] END[facility];  Service: Endoscopy;  Laterality: N/A;   ESOPHAGOGASTRODUODENOSCOPY (EGD) WITH PROPOFOL N/A 08/09/2018   Procedure: ESOPHAGOGASTRODUODENOSCOPY (EGD) WITH PROPOFOL;  Surgeon: Pasty Spillers, MD;  Location: [facility];  Service: Endoscopy;  Laterality: N/A;   JOINT REPLACEMENT     KNEE ARTHROPLASTY Right 10/25/2016   Procedure: COMPUTER ASSISTED TOTAL KNEE ARTHROPLASTY;  Surgeon: Donato Heinz, MD;  Location: [facility] OR[facility];  Service: Orthopedics;  Laterality: Right;   KNEE SURGERY  06/2009   LAPAROSCOPY  1984   for endomeriosis   left foot surgery     for plantar fascitis   LUMBAR LAMINECTOMY/DECOMPRESSION MICRODISCECTOMY N/A 10/05/2021   Procedure: L3-5 DECOMPRESSION;  Surgeon: Venetia Night, MD;  Location: [facility] ORS;  Service: Neurosurgery;  Laterality: N/A;   MAXIMUM ACCESS (MAS) TRANSFORAMINAL LUMBAR INTERBODY FUSION (TLIF) 1 LEVEL N/A 10/05/2021   Procedure: L5-S1 TRANSFORAMINAL LUMBAR INTERBODY FUSION (TLIF);  Surgeon: Venetia Night, MD;  Location: [facility];  Service: Neurosurgery;  Laterality: N/A;   novascue uterine ablation  2007   REPLACEMENT TOTAL KNEE Left    TOTAL HIP ARTHROPLASTY Left 01/08/2023   Procedure: TOTAL HIP ARTHROPLASTY ANTERIOR APPROACH;  Surgeon: Reinaldo Berber, MD;  Location: [facility];  Service: Orthopedics;  Laterality: Left;   Social History   Socioeconomic History  Marital status: Married    Spouse name: Charletta Cousin   Number of children: 0   Years of education: Not on file   Highest education level: Not on file  Occupational History   Occupation: mail carrier  Tobacco Use   Smoking status: Former    Packs/day: 2.00    Years: 15.00    Additional pack years: 0.00    Total pack years: 30.00    Types: Cigarettes    Quit date: 10/12/1994    Years since quitting: 28.6   Smokeless tobacco: Never   Tobacco comments:    smoked 2ppd for 15 years; quit in 1995    Vaping Use   Vaping Use: Never used  Substance and Sexual Activity   Alcohol use: Yes    Alcohol/week: 0.0 standard drinks of alcohol    Comment: social, rare   Drug use: No   Sexual activity: Yes    Birth control/protection: Post-menopausal  Other Topics Concern   Not on file  Social History Narrative   Marital status:  Married x 21 years; happily married no abuse, no children.      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      Tobacco: none      Alcohol: none      Drugs: none       Exercise:  Does not get regular exercise.       Seatbelt: 100%      Guns:  Unloaded.  Has concealed carry.   Social Determinants of Health   Financial Resource Strain: Low Risk  (08/02/2022)   Overall Financial Resource Strain (CARDIA)    Difficulty of Paying Living Expenses: Not hard at all  Food Insecurity: No Food Insecurity (01/08/2023)   Hunger Vital Sign    Worried About Running Out of Food in the Last Year: Never true    Ran Out of Food in the Last Year: Never true  Transportation Needs: No Transportation Needs (01/08/2023)   PRAPARE - Administrator, Civil Service (Medical): No    Lack of Transportation (Non-Medical): No  Physical Activity: Not on file  Stress: No Stress Concern Present (08/02/2022)   [employer] of Occupational Health - Occupational Stress Questionnaire    Feeling of Stress : Only a little  Social Connections: Unknown (08/02/2022)   Social Connection and Isolation Panel [NHANES]    Frequency of Communication with Friends and Family: Not on file    Frequency of Social Gatherings with Friends and Family: Not on file    Attends Religious Services: Not on Marketing executive or Organizations: Not on file    Attends Banker Meetings: Not on file    Marital Status: Married   Family History  Problem Relation Age of Onset   Stroke Father 69       CVA   COPD Father     Hyperlipidemia Mother    Heart disease Mother 12       CABG in 66s; AMI age 21   Hypertension Mother    Hypertension Sister    Breast cancer Paternal Aunt 30   Allergies  Allergen Reactions   Celebrex [Celecoxib] Swelling and Shortness Of Breath    Swelling. Developed Facial swelling    Atorvastatin Other (See Comments)    myalgia   Prior to Admission medications   Medication Sig Start Date  End Date Taking? Authorizing Provider  acetaminophen (TYLENOL) 500 MG tablet Take 2 tablets (1,000 mg total) by mouth every 8 (eight) hours. 01/09/23   Evon Slack, PA-C  buPROPion (WELLBUTRIN XL) 150 MG 24 hr tablet Take 150 mg by mouth daily.    [provider]  busPIRone (BUSPAR) 10 MG tablet Take 10 mg by mouth 2 (two) times daily.    [provider]  clonazePAM (KLONOPIN) 0.5 MG tablet Take 0.5-1 tablets (0.25-0.5 mg total) by mouth 2 (two) times daily as needed for anxiety. Patient taking differently: Take 0.25 mg by mouth 2 (two) times daily as needed for anxiety. 01/07/21   [name], PA-C  hydrochlorothiazide (MICROZIDE) 12.5 MG capsule Take 12.5 mg by mouth every morning.    [provider]  levothyroxine (SYNTHROID) 112 MCG tablet TAKE 1 TABLET BY MOUTH DAILY 30 MINUTES BEFORE BREAKFAST Patient taking differently: Take 112 mcg by mouth daily before breakfast. TAKE 1 TABLET BY MOUTH DAILY 30 MINUTES BEFORE BREAKFAST 01/29/21   Joycelyn Man M, PA-C  losartan (COZAAR) 100 MG tablet TAKE 1 TABLET BY MOUTH DAILY Patient taking differently: Take 100 mg by mouth at bedtime. 05/11/21   Chrismon, Jodell Cipro, PA-C  montelukast (SINGULAIR) 10 MG tablet TAKE 1 TABLET AT BEDTIME Patient taking differently: Take 10 mg by mouth at bedtime. 01/06/22   Erasmo Downer, MD  omeprazole (PRILOSEC) 20 MG capsule TAKE 1 CAPSULE BY MOUTH TWICE DAILY BEFORE MEALS Patient taking differently: 20 mg every morning. And prn 06/29/21   Erasmo Downer, MD  ondansetron  (ZOFRAN) 4 MG tablet Take 1 tablet (4 mg total) by mouth every 6 (six) hours as needed for nausea. 01/09/23   Evon Slack, PA-C  rosuvastatin (CRESTOR) 10 MG tablet Take 10 mg by mouth at bedtime.    [provider]  Semaglutide, 1 MG/DOSE, (OZEMPIC, 1 MG/DOSE,) 2 MG/1.5ML SOPN Inject 1 mg into the skin once a week. Fridays    [provider]  traMADol (ULTRAM) 50 MG tablet Take 1 tablet (50 mg total) by mouth every 6 (six) hours as needed for moderate pain. 01/09/23   Evon Slack, PA-C  venlafaxine XR (EFFEXOR-XR) 150 MG 24 hr capsule TAKE 1 CAPSULE BY MOUTH DAILY WITH BREAKFAST. Patient taking differently: 150 mg daily with breakfast. 07/12/21   Chrismon, Jodell Cipro, PA-C     Positive ROS: All other systems have been reviewed and were otherwise negative with the exception of those mentioned in the HPI and as above.  Physical Exam: General: Alert, no acute distress Cardiovascular: No pedal edema. Heart is regular and without murmur.  Respiratory: No cyanosis, no use of accessory musculature. Lungs are clear. GI: No organomegaly, abdomen is soft and non-tender Skin: No lesions in the area of chief complaint Neurologic: Sensation intact distally Psychiatric: Patient is competent for consent with normal mood and affect Lymphatic: No axillary or cervical lymphadenopathy  MUSCULOSKELETAL: Tenderness and swelling at the left thumb CMC joint.  No subluxation present.  Positive grind test with decreased pinch strength.  Neurovascular status good.  Median nerve compression is negative.  Assessment: Osteoarthrosis of the carpometacarpal joint of the thumb  Plan: Plan for Procedure(s): CARPOMETACARPAL (CMC arthroplasty of the left THUMB  The risks benefits and alternatives were discussed with the patient including but not limited to the risks of nonoperative treatment, versus surgical intervention including infection, bleeding, nerve injury,  blood clots, cardiopulmonary  complications, morbidity, mortality, among others, and they were willing to  proceed.   Valinda Hoar, MD [phone]   05/30/2023 12:16 PM

## 2023-05-31 ENCOUNTER — Ambulatory Visit: Payer: Medicare HMO

## 2023-05-31 ENCOUNTER — Ambulatory Visit
Admission: RE | Admit: 2023-05-31 | Discharge: 2023-05-31 | Disposition: A | Discharge status: Home / Self Care | Payer: Medicare HMO | Attending: Specialist | Admitting: Specialist

## 2023-05-31 ENCOUNTER — Other Ambulatory Visit: Payer: Self-pay

## 2023-05-31 ENCOUNTER — Encounter
Admission: RE | Disposition: A | Discharge status: Home / Self Care | Payer: Self-pay | Source: Home / Self Care | Attending: Specialist

## 2023-05-31 ENCOUNTER — Ambulatory Visit: Payer: Medicare HMO | Admitting: Anesthesiology

## 2023-05-31 ENCOUNTER — Encounter: Payer: Self-pay | Admitting: Specialist

## 2023-05-31 DIAGNOSIS — Z87891 Personal history of nicotine dependence: Secondary | ICD-10-CM | POA: Diagnosis not present

## 2023-05-31 DIAGNOSIS — M1812 Unilateral primary osteoarthritis of first carpometacarpal joint, left hand: Secondary | ICD-10-CM | POA: Insufficient documentation

## 2023-05-31 DIAGNOSIS — R7302 Impaired glucose tolerance (oral): Secondary | ICD-10-CM

## 2023-05-31 DIAGNOSIS — E890 Postprocedural hypothyroidism: Secondary | ICD-10-CM

## 2023-05-31 DIAGNOSIS — Z01812 Encounter for preprocedural laboratory examination: Secondary | ICD-10-CM

## 2023-05-31 HISTORY — PX: CARPOMETACARPAL (CMC) FUSION OF THUMB: SHX6290

## 2023-05-31 HISTORY — DX: Primary osteoarthritis, unspecified hand: M19.049

## 2023-05-31 LAB — GLUCOSE, CAPILLARY
Glucose-Capillary: 94 mg/dL (ref 70–99)
Glucose-Capillary: 102 mg/dL — ABNORMAL HIGH (ref 70–99)

## 2023-05-31 SURGERY — CARPOMETACARPAL (CMC) FUSION OF THUMB
Anesthesia: General | Site: Thumb | Laterality: Left

## 2023-05-31 MED ORDER — PROPOFOL 500 MG/50ML IV EMUL
INTRAVENOUS | Status: DC | PRN
  Administered 2023-05-31: 150 ug/kg/min via INTRAVENOUS

## 2023-05-31 MED ORDER — FENTANYL CITRATE (PF) 100 MCG/2ML IJ SOLN
INTRAMUSCULAR | Status: DC | PRN
  Administered 2023-05-31 (×2): 50 ug via INTRAVENOUS

## 2023-05-31 MED ORDER — GABAPENTIN 300 MG PO CAPS
300.0000 mg | ORAL_CAPSULE | ORAL | Status: AC
  Administered 2023-05-31: 300 mg via ORAL

## 2023-05-31 MED ORDER — FENTANYL CITRATE (PF) 100 MCG/2ML IJ SOLN
INTRAMUSCULAR | Status: AC
  Filled 2023-05-31: qty 2

## 2023-05-31 MED ORDER — HYDROCODONE-ACETAMINOPHEN 5-325 MG PO TABS: 1.0000 | ORAL_TABLET | Freq: Four times a day (QID) | ORAL | 0 refills | Status: DC | PRN

## 2023-05-31 MED ORDER — SODIUM CHLORIDE 0.9 % IR SOLN
Status: DC | PRN
  Administered 2023-05-31: 502 mL

## 2023-05-31 MED ORDER — CEFAZOLIN SODIUM-DEXTROSE 2-4 GM/100ML-% IV SOLN
INTRAVENOUS | Status: AC
  Filled 2023-05-31: qty 100

## 2023-05-31 MED ORDER — KETAMINE HCL 50 MG/5ML IJ SOSY
PREFILLED_SYRINGE | INTRAMUSCULAR | Status: AC
  Filled 2023-05-31: qty 5

## 2023-05-31 MED ORDER — ACETAMINOPHEN 10 MG/ML IV SOLN
INTRAVENOUS | Status: DC | PRN
  Administered 2023-05-31: 1000 mg via INTRAVENOUS

## 2023-05-31 MED ORDER — FENTANYL CITRATE (PF) 100 MCG/2ML IJ SOLN
25.0000 ug | INTRAMUSCULAR | Status: DC | PRN
  Administered 2023-05-31: 25 ug via INTRAVENOUS

## 2023-05-31 MED ORDER — EPHEDRINE 5 MG/ML INJ
INTRAVENOUS | Status: AC
  Filled 2023-05-31: qty 10

## 2023-05-31 MED ORDER — PHENYLEPHRINE 80 MCG/ML (10ML) SYRINGE FOR IV PUSH (FOR BLOOD PRESSURE SUPPORT)
PREFILLED_SYRINGE | INTRAVENOUS | Status: AC
  Filled 2023-05-31: qty 10

## 2023-05-31 MED ORDER — PHENYLEPHRINE HCL-NACL 20-0.9 MG/250ML-% IV SOLN
INTRAVENOUS | Status: AC
  Filled 2023-05-31: qty 250

## 2023-05-31 MED ORDER — PHENYLEPHRINE HCL-NACL 20-0.9 MG/250ML-% IV SOLN
INTRAVENOUS | Status: DC | PRN
  Administered 2023-05-31: 30 ug/min via INTRAVENOUS

## 2023-05-31 MED ORDER — ORAL CARE MOUTH RINSE: 15.0000 mL | Freq: Once | OROMUCOSAL | Status: AC

## 2023-05-31 MED ORDER — PROPOFOL 1000 MG/100ML IV EMUL
INTRAVENOUS | Status: AC
  Filled 2023-05-31: qty 100

## 2023-05-31 MED ORDER — GABAPENTIN 300 MG PO CAPS
ORAL_CAPSULE | ORAL | Status: AC
  Filled 2023-05-31: qty 1

## 2023-05-31 MED ORDER — PROPOFOL 10 MG/ML IV BOLUS
INTRAVENOUS | Status: DC | PRN
  Administered 2023-05-31: 140 mg via INTRAVENOUS
  Administered 2023-05-31: 60 mg via INTRAVENOUS

## 2023-05-31 MED ORDER — BUPIVACAINE HCL (PF) 0.5 % IJ SOLN
INTRAMUSCULAR | Status: AC
  Filled 2023-05-31: qty 30

## 2023-05-31 MED ORDER — MIDAZOLAM HCL 2 MG/2ML IJ SOLN
INTRAMUSCULAR | Status: AC
  Filled 2023-05-31: qty 2

## 2023-05-31 MED ORDER — ONDANSETRON HCL 4 MG/2ML IJ SOLN
INTRAMUSCULAR | Status: DC | PRN
  Administered 2023-05-31: 4 mg via INTRAVENOUS

## 2023-05-31 MED ORDER — OXYCODONE HCL 5 MG PO TABS
ORAL_TABLET | ORAL | Status: AC
  Filled 2023-05-31: qty 1

## 2023-05-31 MED ORDER — GABAPENTIN 400 MG PO CAPS: 400.0000 mg | ORAL_CAPSULE | Freq: Three times a day (TID) | ORAL | 3 refills | Status: AC

## 2023-05-31 MED ORDER — KETAMINE HCL 10 MG/ML IJ SOLN
INTRAMUSCULAR | Status: DC | PRN
  Administered 2023-05-31: 20 mg via INTRAVENOUS

## 2023-05-31 MED ORDER — LIDOCAINE HCL (CARDIAC) PF 100 MG/5ML IV SOSY
PREFILLED_SYRINGE | INTRAVENOUS | Status: DC | PRN
  Administered 2023-05-31: 60 mg via INTRAVENOUS

## 2023-05-31 MED ORDER — PHENYLEPHRINE 80 MCG/ML (10ML) SYRINGE FOR IV PUSH (FOR BLOOD PRESSURE SUPPORT)
PREFILLED_SYRINGE | INTRAVENOUS | Status: DC | PRN
  Administered 2023-05-31 (×2): 80 ug via INTRAVENOUS

## 2023-05-31 MED ORDER — OXYCODONE HCL 5 MG PO TABS
5.0000 mg | ORAL_TABLET | Freq: Once | ORAL | Status: AC
  Administered 2023-05-31: 5 mg via ORAL

## 2023-05-31 MED ORDER — ROCURONIUM BROMIDE 100 MG/10ML IV SOLN
INTRAVENOUS | Status: DC | PRN
  Administered 2023-05-31: 30 mg via INTRAVENOUS

## 2023-05-31 MED ORDER — SUGAMMADEX SODIUM 200 MG/2ML IV SOLN
INTRAVENOUS | Status: DC | PRN
  Administered 2023-05-31: 200 mg via INTRAVENOUS

## 2023-05-31 MED ORDER — CHLORHEXIDINE GLUCONATE 0.12 % MT SOLN
OROMUCOSAL | Status: AC
  Filled 2023-05-31: qty 15

## 2023-05-31 MED ORDER — CEFAZOLIN SODIUM-DEXTROSE 2-4 GM/100ML-% IV SOLN
2.0000 g | INTRAVENOUS | Status: AC
  Administered 2023-05-31: 2 g via INTRAVENOUS

## 2023-05-31 MED ORDER — EPHEDRINE SULFATE (PRESSORS) 50 MG/ML IJ SOLN
INTRAMUSCULAR | Status: DC | PRN
  Administered 2023-05-31: 10 mg via INTRAVENOUS

## 2023-05-31 MED ORDER — ACETAMINOPHEN 10 MG/ML IV SOLN
INTRAVENOUS | Status: AC
  Filled 2023-05-31: qty 100

## 2023-05-31 MED ORDER — SODIUM CHLORIDE 0.9 % IV SOLN: INTRAVENOUS | Status: DC

## 2023-05-31 MED ORDER — BUPIVACAINE HCL (PF) 0.5 % IJ SOLN
INTRAMUSCULAR | Status: DC | PRN
  Administered 2023-05-31: 30 mL

## 2023-05-31 MED ORDER — CHLORHEXIDINE GLUCONATE 0.12 % MT SOLN
15.0000 mL | Freq: Once | OROMUCOSAL | Status: AC
  Administered 2023-05-31: 15 mL via OROMUCOSAL

## 2023-05-31 MED ORDER — MELOXICAM 15 MG PO TABS: 15.0000 mg | ORAL_TABLET | Freq: Every day | ORAL | 3 refills | Status: AC

## 2023-05-31 MED ORDER — DEXAMETHASONE SODIUM PHOSPHATE 10 MG/ML IJ SOLN
INTRAMUSCULAR | Status: DC | PRN
  Administered 2023-05-31: 10 mg via INTRAVENOUS

## 2023-05-31 MED ORDER — CHLORHEXIDINE GLUCONATE CLOTH 2 % EX PADS
6.0000 | MEDICATED_PAD | Freq: Once | CUTANEOUS | Status: AC
  Administered 2023-05-31: 6 via TOPICAL

## 2023-05-31 SURGICAL SUPPLY — 49 items
APL PRP STRL LF DISP 70% ISPRP (MISCELLANEOUS) ×1
BLADE OSC/SAGITTAL MD 5.5X18 (BLADE) ×1 IMPLANT
BLADE SURG MINI STRL (BLADE) ×1 IMPLANT
BNDG ESMARCH 4 X 12 STRL LF (GAUZE/BANDAGES/DRESSINGS) ×1
BNDG ESMARCH 4X12 STRL LF (GAUZE/BANDAGES/DRESSINGS) ×1 IMPLANT
CHLORAPREP W/TINT 26 (MISCELLANEOUS) ×1 IMPLANT
CUFF TOURN SGL QUICK 18X4 (TOURNIQUET CUFF) ×1 IMPLANT
DRAPE FLUOR MINI C-ARM 54X84 (DRAPES) ×1 IMPLANT
DRSG GAUZE FLUFF 36X18 (GAUZE/BANDAGES/DRESSINGS) ×3 IMPLANT
ELECT REM PT RETURN 9FT ADLT (ELECTROSURGICAL) ×1
ELECTRODE REM PT RTRN 9FT ADLT (ELECTROSURGICAL) ×1 IMPLANT
GAUZE XEROFORM 1X8 LF (GAUZE/BANDAGES/DRESSINGS) ×1 IMPLANT
GLOVE BIO SURGEON STRL SZ7.5 (GLOVE) ×1 IMPLANT
GLOVE BIO SURGEON STRL SZ8 (GLOVE) IMPLANT
GOWN STRL REUS W/ TWL LRG LVL3 (GOWN DISPOSABLE) ×1 IMPLANT
GOWN STRL REUS W/TWL LRG LVL3 (GOWN DISPOSABLE) ×1
GOWN STRL REUS W/TWL LRG LVL4 (GOWN DISPOSABLE) ×1 IMPLANT
KIT TURNOVER KIT A (KITS) ×1 IMPLANT
LOOP VESSEL MINI 0.8X406 BLUE (MISCELLANEOUS) IMPLANT
MANIFOLD NEPTUNE II (INSTRUMENTS) ×1 IMPLANT
NDL FILTER BLUNT 18X1 1/2 (NEEDLE) ×1 IMPLANT
NEEDLE FILTER BLUNT 18X1 1/2 (NEEDLE) ×1 IMPLANT
NS IRRIG 500ML POUR BTL (IV SOLUTION) ×1 IMPLANT
PACK EXTREMITY ARMC (MISCELLANEOUS) ×1 IMPLANT
PAD CAST 4YDX4 CTTN HI CHSV (CAST SUPPLIES) ×1 IMPLANT
PADDING CAST COTTON 4X4 STRL (CAST SUPPLIES) ×1
PASSER SUT SWANSON 36MM LOOP (INSTRUMENTS) ×1 IMPLANT
SPIKE FLUID TRANSFER (MISCELLANEOUS) ×1 IMPLANT
SPLINT CAST 1 STEP 3X12 (MISCELLANEOUS) ×1 IMPLANT
SPONGE T-LAP 18X18 ~~LOC~~+RFID (SPONGE) ×1 IMPLANT
STOCKINETTE 48X4 2 PLY STRL (GAUZE/BANDAGES/DRESSINGS) ×1 IMPLANT
STOCKINETTE BIAS CUT 3 980034 (MISCELLANEOUS) IMPLANT
STOCKINETTE BIAS CUT 4 980044 (GAUZE/BANDAGES/DRESSINGS) ×1 IMPLANT
STOCKINETTE STRL 4IN 9604848 (GAUZE/BANDAGES/DRESSINGS) ×1 IMPLANT
SUT ETHILON 4-0 (SUTURE)
SUT ETHILON 4-0 FS2 18XMFL BLK (SUTURE)
SUT ETHILON 5-0 FS-2 18 BLK (SUTURE) ×1 IMPLANT
SUT VIC AB 2-0 SH 27 (SUTURE) ×1
SUT VIC AB 2-0 SH 27XBRD (SUTURE) ×1 IMPLANT
SUT VIC AB 3-0 SH 27 (SUTURE)
SUT VIC AB 3-0 SH 27X BRD (SUTURE) ×1 IMPLANT
SUT VIC AB 4-0 SH 27 (SUTURE) ×2
SUT VIC AB 4-0 SH 27XANBCTRL (SUTURE) ×1 IMPLANT
SUTURE ETHLN 4-0 FS2 18XMF BLK (SUTURE) ×1 IMPLANT
SYR 30ML LL (SYRINGE) ×1 IMPLANT
SYR 5ML LL (SYRINGE) ×1 IMPLANT
TRAP FLUID SMOKE EVACUATOR (MISCELLANEOUS) ×1 IMPLANT
WATER STERILE IRR 500ML POUR (IV SOLUTION) ×1 IMPLANT
WIRE Z .062 C-WIRE SPADE TIP (WIRE) ×1 IMPLANT

## 2023-05-31 NOTE — Anesthesia Preprocedure Evaluation (Signed)
Anesthesia Evaluation  Patient identified by MRN, date of birth, ID band Patient awake    Reviewed: Allergy & Precautions, NPO status , Patient's Chart, lab work & pertinent test results  History of Anesthesia Complications (+) PONV and history of anesthetic complications  Airway Mallampati: III  TM Distance: >3 FB Neck ROM: full    Dental  (+) Teeth Intact, Caps, Dental Advidsory Given,    Pulmonary neg shortness of breath, asthma , sleep apnea , neg recent URI, former smoker   Pulmonary exam normal breath sounds clear to auscultation       Cardiovascular hypertension, Pt. on medications (-) angina (-) Past MI and (-) Cardiac Stents Normal cardiovascular exam(-) dysrhythmias + Valvular Problems/Murmurs  Rhythm:Regular Rate:Normal     Neuro/Psych  Headaches, neg Seizures PSYCHIATRIC DISORDERS Anxiety Depression     Neuromuscular disease (fibromyalgia)    GI/Hepatic negative GI ROS, Neg liver ROS,GERD  Medicated,,  Endo/Other  negative endocrine ROSdiabetesHypothyroidism  Prediabetic, on no Diabetis rx  Renal/GU   negative genitourinary   Musculoskeletal  (+) Arthritis ,  Fibromyalgia -  Abdominal Normal abdominal exam  (+)   Peds negative pediatric ROS (+)  Hematology negative hematology ROS (+) Blood dyscrasia, anemia   Anesthesia Other Findings Past Medical History: No date: Allergy No date: Anemia     Comment:  as a child No date: Anxiety No date: Arthritis     Comment:  Knees, feet, elbows , neck No date: Asthma     Comment:  mild; assoc with seasonal allergies; uses Albuterol once              per year on average. No date: Atypical chest pain     Comment:  hx  No date: Complication of anesthesia     Comment:  nausea, last 2 surgeries nausea much less No date: DM (diabetes mellitus), type 2 (HCC) 12/04/2010: Fibromyalgia     Comment:  [facility] in [place]; Dr. Gwenevere Ghazi;                followed every six months. No date: GERD (gastroesophageal reflux disease) No date: Headache No date: Heart murmur     Comment:  Mitral valve Prolapse No date: HLD (hyperlipidemia) No date: Hypertension post radioactive iodine: Hypothyroidism     Comment:  Hyperthyroidism; now secondary hypothyroidism No date: MR (mitral regurgitation)     Comment:  moderate; probably MVP. echo 3/10 at [facility] cardiology with               normal EF, moderate MR  No date: Obesity No date: OSA on CPAP No date: PONV (postoperative nausea and vomiting)     Comment:  hard time waking No date: Pre-diabetes  Past Surgical History: 10/05/2021: APPLICATION OF WOUND VAC     Comment:  Procedure: APPLICATION OF WOUND VAC;  Surgeon:               Venetia Night, MD;  Location: [facility] ORS;  Service:               Neurosurgery;;  prevena plus wound vac  12/04/2009: COLONOSCOPY     Comment:  normal; Iftikhar 08/09/2018: COLONOSCOPY WITH PROPOFOL; N/A     Comment:  Procedure: COLONOSCOPY WITH PROPOFOL;  Surgeon:               Pasty Spillers, MD;  Location: [facility] END[facility];                Service: Endoscopy;  Laterality:  N/A; 08/09/2018: ESOPHAGOGASTRODUODENOSCOPY (EGD) WITH PROPOFOL; N/A     Comment:  Procedure: ESOPHAGOGASTRODUODENOSCOPY (EGD) WITH               PROPOFOL;  Surgeon: Pasty Spillers, MD;  Location:               [facility] END[facility];  Service: Endoscopy;  Laterality: N/A; No date: JOINT REPLACEMENT 10/25/2016: KNEE ARTHROPLASTY; Right     Comment:  Procedure: COMPUTER ASSISTED TOTAL KNEE ARTHROPLASTY;                Surgeon: Donato Heinz, MD;  Location: [facility] ORS;                Service: Orthopedics;  Laterality: Right; 06/2009: KNEE SURGERY 1984: LAPAROSCOPY     Comment:  for endomeriosis No date: left foot surgery     Comment:  for plantar fascitis 10/05/2021: LUMBAR LAMINECTOMY/DECOMPRESSION MICRODISCECTOMY; N/A     Comment:  Procedure: L3-5 DECOMPRESSION;  Surgeon: Venetia Night, MD;  Location: [facility] ORS;  Service: Neurosurgery;              Laterality: N/A; 10/05/2021: MAXIMUM ACCESS (MAS) TRANSFORAMINAL LUMBAR INTERBODY  FUSION (TLIF) 1 LEVEL; N/A     Comment:  Procedure: L5-S1 TRANSFORAMINAL LUMBAR INTERBODY FUSION               (TLIF);  Surgeon: Venetia Night, MD;  Location: [facility]              ORS;  Service: Neurosurgery;  Laterality: N/A; 2007: novascue uterine ablation No date: REPLACEMENT TOTAL KNEE; Left     Reproductive/Obstetrics negative OB ROS                             Anesthesia Physical Anesthesia Plan  ASA: 3  Anesthesia Plan: General   Post-op Pain Management:    Induction: Intravenous  PONV Risk Score and Plan: 1 and Ondansetron, Dexamethasone, Propofol infusion and TIVA  Airway Management Planned: LMA  Additional Equipment:   Intra-op Plan:   Post-operative Plan: Extubation in OR  Informed Consent: I have reviewed the patients History and Physical, chart, labs and discussed the procedure including the risks, benefits and alternatives for the proposed anesthesia with the patient or authorized representative who has indicated his/her understanding and acceptance.     Dental Advisory Given  Plan Discussed with: CRNA and Surgeon  Anesthesia Plan Comments:        Anesthesia Quick Evaluation

## 2023-05-31 NOTE — H&P (Signed)
THE PATIENT WAS SEEN PRIOR TO SURGERY TODAY.  HISTORY, ALLERGIES, HOME MEDICATIONS AND OPERATIVE PROCEDURE WERE REVIEWED. RISKS AND BENEFITS OF SURGERY DISCUSSED WITH PATIENT AGAIN.  NO CHANGES FROM INITIAL HISTORY AND PHYSICAL NOTED.    

## 2023-05-31 NOTE — Op Note (Signed)
05/31/2023  11:08 AM  PATIENT:  Jane [name]    PRE-OPERATIVE DIAGNOSIS:  Osteoarthrosis of the carpometacarpal joint of the left thumb  POST-OPERATIVE DIAGNOSIS:  Same  PROCEDURE: CMC arthroplasty of the left thumb with palmaris longus graft    SURGEON:  Valinda Hoar, MD  ANESTHESIA:   General  PREOPERATIVE INDICATIONS:  [name] is a  62 y.o. female with a diagnosis of Osteoarthrosis of the carpometacarpal joint of the thumb who failed conservative measures and elected for surgical management.    The risks benefits and alternatives were discussed with the patient preoperatively including but not limited to the risks of infection, bleeding, nerve injury, cardiopulmonary complications, the need for revision surgery, among others, and the patient was willing to proceed.  EBL: None  TOURNIQUET TIME: 81 MIN  OPERATIVE IMPLANTS: None  OPERATIVE FINDINGS: Advanced arthritis left thumb cmc joint  OPERATIVE PROCEDURE:  The patient was brought to the operating room and underwent satisfactory general anesthesia in the supine position.  The operative arm was prepped and draped in a sterile fashion.  The patient was noted to have an excellent palmaris longus tendon.  3 short transverse incisions were made over the course of the tendon and it was harvested under direct vision.  It was then placed in a moist sponge on the back table.  An S-shaped incision was then made over the base of the thumb metacarpal dorsally.  Dissection was carried out carefully under loupe magnification, carefully sparing the sensory nerves.  The tendon sheath around the abductor pollicis longus and extensor pollicis brevis was released under direct vision, including over the radial styloid.  Nerves were carefully spared.  Retractors were inserted and the capsule of the trapezium and metacarpal were opened longitudinally.  The radial artery and veins were carefully dissected free and retracted with a vessel loop  drain.  The capsule was dissected off the trapezium and the trapezium was then morselized with the oscillating saw and rongeur.  The flexor carpi radialis tendon was freed up in the base of the wound.  The palmaris longus tendon was passed beneath this.  A 3.5 mm drill was used to create a tunnel through the base of the metacarpal.  The tendon was then passed up through the metacarpal using a tendon passer.  The thumb had been placed in traction which was then released.  The tendon was tied upon itself and the knot was sutured to itself prevent slippage.  It was then tied in multiple knots and sutured into a ball.  This was then rotated into the space between the metacarpal and the scaphoid.  The capsule was then carefully and thoroughly, closed with 2-0 Vicryl suture.  After irrigation, the skin was closed with 5-0 nylon.  The forearm wounds had been closed with a similar suture.  A well-padded thumb spica splint was applied.  Tourniquet was deflated with good return of blood flow to the hand.  Sponge and needle counts were correct.  Patient was taken to recovery in good condition.  Valinda Hoar, MD

## 2023-05-31 NOTE — Anesthesia Postprocedure Evaluation (Signed)
Anesthesia Post Note  Patient: Jane [name]  Procedure(s) Performed: CARPOMETACARPAL (CMC) FUSION OF THUMB (Left: Thumb)  Patient location during evaluation: Endoscopy Anesthesia Type: General Level of consciousness: awake and alert Pain management: pain level controlled Vital Signs Assessment: post-procedure vital signs reviewed and stable Respiratory status: spontaneous breathing, nonlabored ventilation, respiratory function stable and patient connected to nasal cannula oxygen Cardiovascular status: blood pressure returned to baseline and stable Postop Assessment: no apparent nausea or vomiting Anesthetic complications: no   No notable events documented.   Last Vitals:  Vitals:   05/31/23 1200 05/31/23 1215  BP: 122/64 (!) 119/59  Pulse: 82 (!) 59  Resp: 15 13  Temp:  36.4 C  SpO2: 97% 95%    Last Pain:  Vitals:   05/31/23 1200  TempSrc:   PainSc: 4                  Jane [name]

## 2023-05-31 NOTE — Transfer of Care (Signed)
Immediate Anesthesia Transfer of Care Note  Patient: Jane [name]  Procedure(s) Performed: CARPOMETACARPAL (CMC) FUSION OF THUMB (Left: Thumb)  Patient Location: PACU  Anesthesia Type:General  Level of Consciousness: drowsy  Airway & Oxygen Therapy: Patient Spontanous Breathing and Patient connected to face mask oxygen  Post-op Assessment: Report given to RN, Post -op Vital signs reviewed and stable, and Patient moving all extremities  Post vital signs: Reviewed and stable  Last Vitals:  Vitals Value Taken Time  BP 115/60 05/31/23 1108  Temp    Pulse 71 05/31/23 1110  Resp 19 05/31/23 1110  SpO2 100 % 05/31/23 1110  Vitals shown include unvalidated device data.  Last Pain:  Vitals:   05/31/23 0815  TempSrc: Temporal  PainSc: 0-No pain         Complications: No notable events documented.

## 2023-05-31 NOTE — Discharge Instructions (Signed)

## 2023-05-31 NOTE — Anesthesia Procedure Notes (Signed)
Procedure Name: LMA Insertion Date/Time: 05/31/2023 9:18 AM  Performed by: Katherine Basset, CRNAPre-anesthesia Checklist: Patient identified, Emergency Drugs available, Suction available and Patient being monitored Patient Re-evaluated:Patient Re-evaluated prior to induction Oxygen Delivery Method: Circle system utilized Preoxygenation: Pre-oxygenation with 100% oxygen Induction Type: IV induction LMA: LMA inserted LMA Size: 4.0 Number of attempts: 1 Placement Confirmation: positive ETCO2 and breath sounds checked- equal and bilateral Tube secured with: Tape Dental Injury: Teeth and Oropharynx as per pre-operative assessment  Comments: Igel size 4 utilized w/o difficulty

## 2023-06-01 ENCOUNTER — Encounter: Payer: Self-pay | Admitting: Specialist

## 2023-07-19 ENCOUNTER — Other Ambulatory Visit: Payer: Self-pay | Admitting: Neurosurgery

## 2023-10-18 ENCOUNTER — Other Ambulatory Visit: Payer: Self-pay | Admitting: Student

## 2023-10-18 DIAGNOSIS — Z1231 Encounter for screening mammogram for malignant neoplasm of breast: Secondary | ICD-10-CM

## 2023-10-31 ENCOUNTER — Ambulatory Visit
Admission: RE | Admit: 2023-10-31 | Discharge: 2023-10-31 | Disposition: A | Discharge status: Home / Self Care | Payer: Medicare HMO | Source: Ambulatory Visit | Attending: Student | Admitting: Student

## 2023-10-31 DIAGNOSIS — Z1231 Encounter for screening mammogram for malignant neoplasm of breast: Secondary | ICD-10-CM | POA: Diagnosis present

## 2024-09-12 ENCOUNTER — Inpatient Hospital Stay
Admission: RE | Admit: 2024-09-12 | Discharge: 2024-09-12 | Disposition: A | Discharge status: Home / Self Care | Payer: Self-pay | Source: Ambulatory Visit | Attending: Physician Assistant | Admitting: Physician Assistant

## 2024-09-12 ENCOUNTER — Other Ambulatory Visit: Payer: Self-pay | Admitting: Family Medicine

## 2024-09-12 DIAGNOSIS — Z049 Encounter for examination and observation for unspecified reason: Secondary | ICD-10-CM

## 2024-09-15 ENCOUNTER — Other Ambulatory Visit: Payer: Self-pay

## 2024-09-15 ENCOUNTER — Encounter: Payer: Self-pay | Admitting: *Deleted

## 2024-09-15 ENCOUNTER — Emergency Department
Admission: EM | Admit: 2024-09-15 | Discharge: 2024-09-15 | Disposition: A | Discharge status: Home / Self Care | Attending: Emergency Medicine | Admitting: Emergency Medicine

## 2024-09-15 DIAGNOSIS — Z23 Encounter for immunization: Secondary | ICD-10-CM | POA: Insufficient documentation

## 2024-09-15 DIAGNOSIS — I10 Essential (primary) hypertension: Secondary | ICD-10-CM | POA: Diagnosis not present

## 2024-09-15 DIAGNOSIS — W260XXA Contact with knife, initial encounter: Secondary | ICD-10-CM | POA: Insufficient documentation

## 2024-09-15 DIAGNOSIS — S61211A Laceration without foreign body of left index finger without damage to nail, initial encounter: Secondary | ICD-10-CM | POA: Insufficient documentation

## 2024-09-15 DIAGNOSIS — S6992XA Unspecified injury of left wrist, hand and finger(s), initial encounter: Secondary | ICD-10-CM | POA: Diagnosis present

## 2024-09-15 MED ORDER — TETANUS-DIPHTH-ACELL PERTUSSIS 5-2-15.5 LF-MCG/0.5 IM SUSP
0.5000 mL | Freq: Once | INTRAMUSCULAR | Status: AC
  Administered 2024-09-15: 0.5 mL via INTRAMUSCULAR
  Filled 2024-09-15: qty 0.5

## 2024-09-15 MED ORDER — LIDOCAINE HCL (PF) 1 % IJ SOLN
5.0000 mL | Freq: Once | INTRAMUSCULAR | Status: AC
  Administered 2024-09-15: 5 mL
  Filled 2024-09-15: qty 5

## 2024-09-15 NOTE — Discharge Instructions (Signed)
 You have been diagnosed with laceration of the left index finger.  You do not submerge your finger in water  to prevent infection.  You can take ibuprofen every 8 hours as needed for pain.  Please call your PCP and make an appointment for suture removal in 6 days.  Please check for signs of infection.  Please come back to ED or go to your PCP if you have new symptoms or symptoms worsen.  It was a pleasure to help you today.  Lorane Sar, [place]

## 2024-09-15 NOTE — ED Notes (Signed)
 Bleeding controlled at this time from laceration. Finger wrapped

## 2024-09-15 NOTE — ED Triage Notes (Signed)
 Pt ambulatory to triage.  Pt has laceration to left index finger.  Pt was cutting fatback with a knife and cut her finger.  Bleeding controlled.   Pt alert

## 2024-09-15 NOTE — ED Provider Notes (Signed)
 [facility] Provider Note    Event Date/Time   First MD Initiated Contact with Patient 09/15/24 2024     (approximate)   History   Laceration    HPI  Jane [name] is a 63 y.o. female    with a past medical history of rotator cuff tendinitis callus of foot, total replacement left hip, cellulitis sinusitis, hypertension, carpometacarpal fusion of thumb, who presents to the ED complaining of laceration left index finger. According to the patient, she was cutting fat back with a knife and cut her left index finger.  Patient is not taking any blood thinners.  Patient is here by her.     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 10/25/2016   Vitamin D  deficiency 09/23/2016   Glucose intolerance (impaired glucose tolerance) 01/01/2016   Class 1 obesity due to excess calories with serious comorbidity and body mass index (BMI) of 33.0 to 33.9 in adult 01/01/2016   Gastroesophageal reflux disease without esophagitis 01/01/2016   Asthma, mild intermittent 01/01/2016   GAD (generalized anxiety disorder) 11/09/2014   Osteoarthritis, knee 10/20/2012   Fibromyalgia 10/20/2012   Hypothyroidism following radioiodine therapy 10/20/2012   Allergic rhinitis 02/24/2010   Hyperlipidemia 12/31/2009   MURMUR 12/31/2009     ROS: Patient currently denies any vision changes, tinnitus, difficulty speaking, facial droop, sore throat, chest pain, shortness of breath, abdominal pain, nausea/vomiting/diarrhea, dysuria, or weakness/numbness/paresthesias in any extremity   Physical Exam   Triage Vital Signs: ED Triage Vitals   Encounter Vitals Group     BP 09/15/24 1920 (!) 150/92     Girls Systolic BP Percentile --      Girls Diastolic BP Percentile --      Boys Systolic BP Percentile --      Boys Diastolic BP Percentile --      Pulse Rate 09/15/24 1920 80     Resp 09/15/24 1920 18     Temp 09/15/24 1920 98.2 F (36.8 C)     Temp Source 09/15/24 1920 Oral     SpO2 09/15/24 1920 100 %     Weight 09/15/24 1918 214 lb (97.1 kg)     Height 09/15/24 1918 5' 7 (1.702 m)     Head Circumference --      Peak Flow --      Pain Score 09/15/24 1918 6     Pain Loc --      Pain Education --      Exclude from Growth Chart --     Most recent vital signs: Vitals:   09/15/24 1920  BP: (!) 150/92  Pulse: 80  Resp: 18  Temp: 98.2 F (36.8 C)  SpO2: 100%     Physical Exam Vitals and nursing note reviewed.  In triage patient was hypertensive  General:          Awake, no distress.  CV:                  Good peripheral perfusion.  Resp:               Normal effort. no tachypnea Abd:  No distention.  Soft nontender  Other:              Left index finger: Laceration on the dorsal side of the proximal phalange of the index finger.  No active bleeding.  Full ROM.  Incision is intact ED Results / Procedures / Treatments   Labs (all labs ordered are listed, but only abnormal results are displayed) Labs Reviewed - No data to display    RADIOLOGY I independently reviewed and interpreted imaging and agree with radiologists findings.      PROCEDURES:  Critical Care performed:   .Laceration Repair  Date/Time: 09/15/2024 9:47 PM  Performed by: Janit Kast, PA-C Authorized by: Janit Kast, PA-C   Consent:    Consent obtained:  Verbal   Consent given by:  Patient   Risks, benefits, and alternatives were discussed: yes     Risks discussed:  Infection and pain Universal protocol:    Procedure explained and questions answered to patient or proxy's satisfaction: yes     Patient  identity confirmed:  Verbally with patient Anesthesia:    Anesthesia method:  Local infiltration and nerve block   Local anesthetic:  Lidocaine  1% WITH epi   Block needle gauge:  27 G   Block anesthetic:  Lidocaine  1% WITH epi   Block injection procedure:  Anatomic landmarks identified, introduced needle and negative aspiration for blood   Block outcome:  Anesthesia achieved Laceration details:    Location:  Finger   Finger location:  L index finger   Length (cm):  1.5   Depth (mm):  2 Pre-procedure details:    Preparation:  Patient was prepped and draped in usual sterile fashion Exploration:    Limited defect created (wound extended): no     Hemostasis achieved with:  Direct pressure   Contaminated: no   Treatment:    Area cleansed with:  Povidone-iodine   Amount of cleaning:  Standard   Irrigation solution:  Sterile saline   Irrigation method:  Tap   Visualized foreign bodies/material removed: no     Debridement:  None   Undermining:  None Skin repair:    Repair method:  Sutures   Suture size:  5-0   Suture material:  Nylon   Number of sutures:  7 Approximation:    Approximation:  Close Repair type:    Repair type:  Simple Post-procedure details:    Dressing:  Adhesive bandage   Procedure completion:  Tolerated well, no immediate complications    MEDICATIONS ORDERED IN ED: Medications  Tdap (ADACEL) injection 0.5 mL (0.5 mLs Intramuscular Given 09/15/24 2113)  lidocaine  (PF) (XYLOCAINE ) 1 % injection 5 mL (5 mLs Other Given 09/15/24 2118)      IMPRESSION / MDM / ASSESSMENT AND PLAN / ED COURSE  I reviewed the triage vital signs and the nursing notes.  Differential diagnosis includes, but is not limited to, laceration, avulsion, unlikely foreign body  Patient's presentation is most consistent with acute, uncomplicated illness.   Jane [name] is a 63 y.o., female who presents today with laceration in the left index finger.  On a physical exam the laceration  is in the dorsal area of the left index finger, proximal phalange,no active bleeding.  Sensation is intact, full ROM. Plan Tdap Ibuprofen Laceration repair Patient's diagnosis is consistent with laceration of the left index finger. I did review the patient's allergies and medications.The patient is in stable and satisfactory condition for discharge home  Patient will be discharged home without prescriptions.Advised  patient to take ibuprofen every 6 hours as needed for pain patient is to follow up with PCP in 6 days for suture removal as needed or otherwise directed. Patient is given ED precautions to return to the ED for any worsening or new symptoms. Discussed plan of care with patient, answered all of patient's questions, Patient agreeable to plan of care. Advised patient to take medications according to the instructions on the label. Discussed possible side effects of new medications. Patient verbalized understanding.  FINAL CLINICAL IMPRESSION(S) / ED DIAGNOSES   Final diagnoses:  Laceration of left index finger without foreign body without damage to nail, initial encounter     Rx / DC Orders   ED Discharge Orders     None        Note:  This document was prepared using Dragon voice recognition software and may include unintentional dictation errors.   Janit Kast, PA-C 09/15/24 2148    Levander Slate, MD 09/15/24 [phone]

## 2024-09-17 NOTE — Progress Notes (Signed)
 Referring Physician:  Franchot Houston, PA-C [address] [place],  [place] [address]  Primary Physician:  Franchot Houston, PA-C  History of Present Illness: 09/29/2024 [name] is here today with a chief complaint of low back and left hip pain.  Previously underwent a L3-5 decompression, L5-S1 TLIF, L3-S1 fusion with Dr. Katrina.  In addition also has undergone a left total hip arthroplasty.  She comes in today for increased pain in her low back over the past 6 months with no inciting event.  She states that she feels though she has a sensation of the screws being pulled out of her back she is having increased numbness, weakness, tingling in her left leg that primarily radiates to her hip and the outside of her thigh.  At times it feels though her knee will give out on her on her left side.  The amount that she can walk has decreased.  She is on any recent physical therapy.  She has been taking meloxicam  and gabapentin  for her pain.    Weakness: none  Bowel/Bladder Dysfunction: none  Conservative measures:  Physical therapy:  Has not participated in recently? Multimodal medical therapy including regular antiinflammatories:  acetaminophen , tramadol , meloxicam , Hydrocodone -acetaminophen , gabapentin  Injections:   08/23/2022: Bilateral sacroiliac joint injections 07/24/2022: Left hip injection (60% relief) 05/30/2021: Right C5-6 transforaminal ESI (good relief for several days) 05/10/2021: Right subacromial bursa injection (80% relief) 04/04/2021: Bilateral S1 transforaminal ESI (40% relief)     Jane [name] has no symptoms of cervical myelopathy.  The symptoms are causing a significant impact on the patient's life.   Review of Systems:  A 10 point review of systems is negative, except for the pertinent positives and negatives detailed in the HPI.  Past Medical History: Past Medical History:  Diagnosis Date   Allergy    Anemia    as a  child   Anxiety    Arthritis    Knees, feet, elbows , neck   Asthma    mild; assoc with seasonal allergies; uses Albuterol  once per year on average.   Atypical chest pain    hx    Complication of anesthesia    nausea, last 2 surgeries nausea much less   Depression    DM (diabetes mellitus), type 2 (HCC)    Fibromyalgia 12/04/2010   [facility] in [place]; Dr. Nat Flicker; followed every six months.   GERD (gastroesophageal reflux disease)    Headache    Heart murmur    Mitral valve Prolapse   HLD (hyperlipidemia)    Hypertension    Hypothyroidism post radioactive iodine   Hyperthyroidism; now secondary hypothyroidism   Lumbar degenerative disc disease    MR (mitral regurgitation)    moderate; probably MVP. echo 3/10 at [facility] cardiology with normal EF, moderate MR    Obesity    OSA on CPAP    Osteoarthritis of carpometacarpal joint    thumb   PONV (postoperative nausea and vomiting)    hard time waking   Pre-diabetes    Vitamin D  deficiency     Past Surgical History: Past Surgical History:  Procedure Laterality Date   APPLICATION OF WOUND VAC  10/05/2021   Procedure: APPLICATION OF WOUND VAC;  Surgeon: Clois Fret, MD;  Location: [facility] ORS;  Service: Neurosurgery;;  prevena plus wound vac    CARPOMETACARPAL [facility]) FUSION OF THUMB Left 05/31/2023   Procedure: CARPOMETACARPAL [facility]) FUSION OF THUMB;  Surgeon: Cleotilde Barrio, MD;  Location: [facility] ORS;  Service: Orthopedics;  Laterality: Left;   COLONOSCOPY  12/04/2009   normal; [facility]   COLONOSCOPY WITH PROPOFOL  N/A 08/09/2018   Procedure: COLONOSCOPY WITH PROPOFOL ;  Surgeon: Janalyn Keene NOVAK, MD;  Location: [facility] ENDOSCOPY;  Service: Endoscopy;  Laterality: N/A;   ESOPHAGOGASTRODUODENOSCOPY (EGD) WITH PROPOFOL  N/A 08/09/2018   Procedure: ESOPHAGOGASTRODUODENOSCOPY (EGD) WITH PROPOFOL ;  Surgeon: Janalyn Keene NOVAK, MD;  Location: [facility] ENDOSCOPY;  Service: Endoscopy;  Laterality: N/A;   JOINT REPLACEMENT      KNEE ARTHROPLASTY Right 10/25/2016   Procedure: COMPUTER ASSISTED TOTAL KNEE ARTHROPLASTY;  Surgeon: Lynwood SHAUNNA Hue, MD;  Location: [facility] ORS;  Service: Orthopedics;  Laterality: Right;   KNEE SURGERY  06/2009   LAPAROSCOPY  1984   for endomeriosis   left foot surgery     for plantar fascitis   LUMBAR LAMINECTOMY/DECOMPRESSION MICRODISCECTOMY N/A 10/05/2021   Procedure: L3-5 DECOMPRESSION;  Surgeon: Clois Fret, MD;  Location: [facility] ORS;  Service: Neurosurgery;  Laterality: N/A;   MAXIMUM ACCESS (MAS) TRANSFORAMINAL LUMBAR INTERBODY FUSION (TLIF) 1 LEVEL N/A 10/05/2021   Procedure: L5-S1 TRANSFORAMINAL LUMBAR INTERBODY FUSION (TLIF);  Surgeon: Clois Fret, MD;  Location: [facility] OR[facility];  Service: Neurosurgery;  Laterality: N/A;   novascue uterine ablation  2007   REPLACEMENT TOTAL KNEE Left    TOTAL HIP ARTHROPLASTY Left 01/08/2023   Procedure: TOTAL HIP ARTHROPLASTY ANTERIOR APPROACH;  Surgeon: Lorelle Hussar, MD;  Location: [facility] OR[facility];  Service: Orthopedics;  Laterality: Left;    Allergies: Allergies as of 09/29/2024 - Review Complete 09/15/2024  Allergen Reaction Noted   Celebrex [celecoxib] Swelling and Shortness Of Breath 10/20/2012   Atorvastatin  Other (See Comments) 04/26/2020    Medications: Outpatient Encounter Medications as of 09/29/2024  Medication Sig   acetaminophen  (TYLENOL ) 500 MG tablet Take 2 tablets (1,000 mg total) by mouth every 8 (eight) hours.   clonazePAM  (KLONOPIN ) 0.5 MG tablet Take 0.5-1 tablets (0.25-0.5 mg total) by mouth 2 (two) times daily as needed for anxiety. (Patient taking differently: Take 0.25 mg by mouth 2 (two) times daily as needed for anxiety.)   gabapentin  (NEURONTIN ) 400 MG capsule Take 1 capsule (400 mg total) by mouth 3 (three) times daily.   hydrochlorothiazide  (MICROZIDE ) 12.5 MG capsule Take 12.5 mg by mouth every morning.   HYDROcodone -acetaminophen  (NORCO) 5-325 MG tablet Take 1-2 tablets by mouth every 6 (six) hours as  needed.   levothyroxine  (SYNTHROID ) 112 MCG tablet TAKE 1 TABLET BY MOUTH DAILY 30 MINUTES BEFORE BREAKFAST (Patient taking differently: Take 112 mcg by mouth daily before breakfast. TAKE 1 TABLET BY MOUTH DAILY 30 MINUTES BEFORE BREAKFAST)   losartan  (COZAAR ) 100 MG tablet TAKE 1 TABLET BY MOUTH DAILY (Patient taking differently: Take 100 mg by mouth at bedtime.)   meloxicam  (MOBIC ) 15 MG tablet Take 1 tablet (15 mg total) by mouth daily.   montelukast  (SINGULAIR ) 10 MG tablet TAKE 1 TABLET AT BEDTIME (Patient taking differently: Take 10 mg by mouth at bedtime.)   omeprazole  (PRILOSEC) 20 MG capsule TAKE 1 CAPSULE BY MOUTH TWICE DAILY BEFORE MEALS (Patient taking differently: 20 mg every morning. And prn)   ondansetron  (ZOFRAN ) 4 MG tablet Take 1 tablet (4 mg total) by mouth every 6 (six) hours as needed for nausea.   rosuvastatin  (CRESTOR ) 10 MG tablet Take 10 mg by mouth at bedtime.   Semaglutide, 1 MG/DOSE, (OZEMPIC, 1 MG/DOSE,) 2 MG/1.5ML SOPN Inject 1 mg into the skin once a week. Fridays   sertraline (ZOLOFT) 50 MG tablet Take 50 mg by mouth daily.  traMADol  (ULTRAM ) 50 MG tablet Take 1 tablet (50 mg total) by mouth every 6 (six) hours as needed for moderate pain.   traZODone (DESYREL) 50 MG tablet Take 50 mg by mouth at bedtime.   urea (CARMOL) 40 % CREA Apply topically.   venlafaxine  XR (EFFEXOR -XR) 150 MG 24 hr capsule TAKE 1 CAPSULE BY MOUTH DAILY WITH BREAKFAST. (Patient taking differently: 150 mg daily with breakfast.)   [DISCONTINUED] buPROPion (WELLBUTRIN XL) 150 MG 24 hr tablet Take 150 mg by mouth daily.   [DISCONTINUED] busPIRone (BUSPAR) 10 MG tablet Take 10 mg by mouth 2 (two) times daily.   No facility-administered encounter medications on file as of 09/29/2024.    Social History: Social History   Tobacco Use   Smoking status: Former    Current packs/day: 0.00    Average packs/day: 2.0 packs/day for 15.0 years (30.0 ttl pk-yrs)    Types: Cigarettes    Start date:  10/13/1979    Quit date: 10/12/1994    Years since quitting: 29.9   Smokeless tobacco: Never   Tobacco comments:    smoked 2ppd for 15 years; quit in 1995   Vaping Use   Vaping status: Never Used  Substance Use Topics   Alcohol use: Yes    Alcohol/week: 0.0 standard drinks of alcohol    Comment: social, rare   Drug use: No    Family Medical History: Family History  Problem Relation Age of Onset   Stroke Father 8       CVA   COPD Father    Hyperlipidemia Mother    Heart disease Mother 65       CABG in 64s; AMI age 71   Hypertension Mother    Hypertension Sister    Breast cancer Paternal Aunt 32    Physical Examination: @VITALWITHPAIN @  General: Patient is well developed, well nourished, calm, collected, and in no apparent distress. Attention to examination is appropriate.  Psychiatric: Patient is non-anxious.  Head:  Pupils equal, round, and reactive to light.  ENT:  Oral mucosa appears well hydrated.  Neck:   Supple.  Full range of motion.  Respiratory: Patient is breathing without any difficulty.  Extremities: No edema.  Vascular: Palpable dorsal pedal pulses.  Skin:   On exposed skin, there are no abnormal skin lesions.  NEUROLOGICAL:     Awake, alert, oriented to person, place, and time.  Speech is clear and fluent. Fund of knowledge is appropriate.   Cranial Nerves: Pupils equal round and reactive to light.  Facial tone is symmetric.   ROM of spine:  TTP of lumbar paraspinals.  Potential swelling about left hip area.  Strength:  Side Iliopsoas Quads Hamstring PF DF EHL  R 5 5 5 5 5 5   L 5 5 5 5 5 5    Reflexes are 2+ in bilateral patella, hard to obtain hamstring flex.  Trace Achilles bilaterally. Clonus is not present.  Toes are down-going.  Bilateral upper and lower extremity sensation is intact to light touch.  Some chronic decreased sensation of bilateral feet   Medical Decision Making  Imaging: EXAM: LUMBAR SPINE-upright 2-3 VIEW    COMPARISON:  Previous studies including MR lumbar spine done on 07/18/2022   FINDINGS: No recent fracture is seen. There is posterior surgical fusion from L3-S1 levels. Intervertebral disc spacer is seen at L5-S1 level. There is laminectomy in lower lumbar spine. Alignment of posterior margins of vertebral bodies is unremarkable.   IMPRESSION: Posterior lumbar fusion from L3-S1 levels.  No recent fracture is seen.      I have personally reviewed the images and agree with the above interpretation.  Assessment and Plan: Ms. Kosar is a pleasant 62 y.o. female who previously underwent a L3-5 decompression, L5-S1 TLIF, L3-S1 fusion with Dr. Katrina.  In addition also has undergone a left total hip arthroplasty and presents today with worsening low back/left hip pain radiating into her left anterior/lateral thigh.  No gross motor deficits on examination, but patient states she has left leg weakness.  Plan includes the following:  -X-rays of both lumbar spine and left hip to be completed today in clinic.  Will evaluate for hardware fracture or failure.  Will review once complete. -Plan for MRI of lumbar spine for further evaluation - Patient has some muscle spasms, we will trial tizanidine.  The risks and benefits of this medication were discussed at length with the patient. - Referral sent for physical therapy.   Thank you for involving me in the care of this patient.    Lyle Decamp, PA-C Dept. of Neurosurgery

## 2024-09-29 ENCOUNTER — Other Ambulatory Visit (INDEPENDENT_AMBULATORY_CARE_PROVIDER_SITE_OTHER)

## 2024-09-29 ENCOUNTER — Ambulatory Visit: Admitting: Physician Assistant

## 2024-09-29 ENCOUNTER — Encounter: Payer: Self-pay | Admitting: Physician Assistant

## 2024-09-29 VITALS — BP 122/78 | Wt 222.6 lb

## 2024-09-29 DIAGNOSIS — M545 Low back pain, unspecified: Secondary | ICD-10-CM

## 2024-09-29 DIAGNOSIS — M62838 Other muscle spasm: Secondary | ICD-10-CM | POA: Diagnosis not present

## 2024-09-29 DIAGNOSIS — Z96642 Presence of left artificial hip joint: Secondary | ICD-10-CM

## 2024-09-29 DIAGNOSIS — Z981 Arthrodesis status: Secondary | ICD-10-CM

## 2024-09-29 DIAGNOSIS — M79605 Pain in left leg: Secondary | ICD-10-CM | POA: Diagnosis not present

## 2024-09-29 DIAGNOSIS — G8929 Other chronic pain: Secondary | ICD-10-CM

## 2024-09-29 MED ORDER — TIZANIDINE HCL 4 MG PO TABS: 4.0000 mg | ORAL_TABLET | Freq: Three times a day (TID) | ORAL | 1 refills | Status: DC

## 2024-10-13 ENCOUNTER — Encounter: Payer: Self-pay | Admitting: Physician Assistant

## 2024-10-20 ENCOUNTER — Ambulatory Visit
Admission: RE | Admit: 2024-10-20 | Discharge: 2024-10-20 | Disposition: A | Discharge status: Home / Self Care | Source: Ambulatory Visit | Attending: Physician Assistant | Admitting: Physician Assistant

## 2024-10-20 DIAGNOSIS — M79605 Pain in left leg: Secondary | ICD-10-CM

## 2024-10-20 DIAGNOSIS — G8929 Other chronic pain: Secondary | ICD-10-CM

## 2024-10-20 DIAGNOSIS — Z981 Arthrodesis status: Secondary | ICD-10-CM

## 2024-10-23 ENCOUNTER — Other Ambulatory Visit: Payer: Self-pay | Admitting: Physician Assistant

## 2024-10-24 ENCOUNTER — Ambulatory Visit: Payer: Self-pay | Admitting: Physician Assistant

## 2024-11-19 NOTE — Progress Notes (Signed)
 [facility] PHYSICAL THERAPY EVALUATION    DATE:11/19/2024 Patients Name:Jane [name]    DOB:Jun 05, 1961 Referring Physician:Frisbie, Lyle Catena,*    Medical Diagnosis: S/p lumbar fusion and SI dysfunction     Treatment Diagnosis: 1. Low back pain with sciatica, sciatica laterality unspecified, unspecified back pain laterality, unspecified chronicity   2. Generalized weakness    Is patient aware of diagnosis/prognosis: [x]  YES      []  NO Subjective:  The patient reports that she had a lumbar fusion on 10/05/21.  She states that she is pleased with her surgery and symptom relief.  However, recently she has started having increased right sided low back pain.  Reportedly her hardware is in good position. She states that she has pain with bending and twisting activities.  She localizes her pain over the right SI joint. She has been referred to outpatient physical therapy for conservative treatment of her SI joint dysfunction to assist with return to prior functional status.   Past Medical history: Past Medical History:  Diagnosis Date   Allergic state    Chickenpox    Diabetes mellitus without complication (CMS/HHS-HCC)    Fibromyalgia    GERD (gastroesophageal reflux disease)    Hypercholesterolemia    Hypertension    Iatrogenic hypothyroidism    status post radioactive iodine ablation   MVP (mitral valve prolapse)    Osteoarthritis    Seasonal asthma (HHS-HCC)    Past Surgical History:  Procedure Laterality Date   Right knee arthroscopy, medial meniscectomy, and chondroplasty  06/23/2009   Left foot, nerve removed from bone  2011   Left knee arthroscopy and medial meniscectomy  04/13/2011   Left total knee arthroplasty  12/25/2012   Dr. Mardee   Right total knee arthroplasty using computer-assisted navigation  10/25/2016   Dr Mardee   L3-5 DECOMPRESSION  10/05/2021   Dr. Reeves Daisy at [facility], [place]   ARTHROPLASTY HIP TOTAL Left  01/08/2023   Anterior by Dr. Lorelle Grist thigh surgery     NovaSure implantation     endometrial ablation   Wisdom tooth      Social History:  The patient is retired.  She lives with her husband.  She is independent at home and in the community.   Prior therapy for same condition:    Prior Lumbar fusion 10/05/21 L3-S1      Hospitalizations:  None this year    Prior level of function:  Independent at home and in the community  Current Functional level:  The patient has increased pain with prolonged sitting, standing and lying down.   Pain scale:  5/10  Aggravating: Sitting standing and lying down   Alleviating: activity avoidance.  OBJECTIVE Postural/Observation: Generally good posture.  Decreased lumbar lordosis noted in sitting and standing.   AROM: Flexion WNL Extension netutral Right Sidebending 50% Left Sidebending 50%   Strength:                    Right  Left Hip Flexion  5/5  5/5 Knee Extension 5/5  5/5 Ankle Dorsiflexion 5/5  5/5 Great Toe Ext  5/5  5/5 Knee Flexion  5/5  5/5   Reflex screen: Not tested.   Sensory: Intact to light touch bilaterally.  The patient denies numbness and tingling.    Gait: The patient is able to ambulate without an AD independently.  Palpation: Mild tenderness noted over the right SI joint and piriformis.   Joint Mobility Grossly hypomobile.  ASSESSMENT: The patient presents with tenderness noted over the right SI joint and piriformis.  Symptoms appear to be consistent with SI joint dysfunction and sacroilitis.   This patient is an excellent candidate for physical therapy in an effort to decrease swelling, increase ROM, increase strength, improve posture, decrease tenderness, decrease pain and to return this patient to their prior level of function without pain, discomfort or limitation.   PLAN: Patients Goals: To decrease pain.   Long Term Goals: 1.  Decrease pain at its worst with activity to  2/10. 2.  The patient will be independent in a thorough HEP to improve ROM, strength, posture and function in an effort to return to their prior level of function. 3.  Decrease tenderness to slight levels 4.  Pt will be able to ambulate for a normal period of time demonstrating improved lumbosacral posture with the least restrictive AD to allow for increased tolerance to walking activities at home and in the community. 5  Pt will demonstrate and maintain a neutral lumbosacral spine posture with squatting and floor lifting activities to allow for increased tolerance to light lifting around the home for items such as clothing on the floor or sticks in the yard. 6.  Pt will demonstrate and maintain a neutral lumbosacral spine posture with standing and walking activities in the home to allow for increased tolerance to household chores such as sweeping and vacuuming. 7.  Pt will demonstrate and maintain a neutral lumbosacral spine posture during stand to/from sit to/from supine transfers to allow for increased tolerance to transfers in the home and in/out of a vehicle.  Treatment Plan: The patient will receive modalities as needed including moist heat, ultrasound, infrared, electrical stimulation, mechanical traction and cold packs.  Therapeutic exercise and therapeutic activities will be performed to increase ROM, strength, flexibility, posture, ergonomics, balance, proprioception and function.  Manual therapy will be performed to increase ROM, strength, mobility and flexibility as well as decrease tension, spasms and tissue texture.  Neuromuscular reeducation and activities of daily living training will be performed to increase strength, stabilization, posture, ergonomics, balance, proprioception and function.  The patient may receive gait training to normailize the gait cycle.  In addition, the patient will participate in a progressive home exercise program to complement the formal physical therapy  sessions.  Frequency: 1-2 per week for 4-6 weeks. Precautions: S/p lumbar Fusion L3-S1 10/05/21  Low complexity evaluation performed.  The patient presents in a stable condition.         I certify the need for these services furnished under this plan of treatment and while under my care   Therapist:Tracy L Partin, PT                        Date: 11/17/24  Back Treatment Log   Date 11/17/24           Bike/Stepper (seat_____)            Treadmill            Leg Press            Wall Slides            Multi-Hip Flex            Multi-Hip Ext            Multi-Hip Abd            Multi-Hip Add  Knee Extension            Leg Curls            Standing shoulder flex            Standing shoulder abd            SLR            Marching            Bridges            Abdmonial Isometrics w/ball            Partial sit-ups            Pelvic Tilts            Prone Prop/ Prone Push Up            SKC/Piriformis/HAMS/QUADS 2x30           SI joint mobs/STM 10           Seated pelvic rocking 10x           Thoracic rotation stretch 2x30           Seated QL stretch 2x30                                                                       Ultrasound/ Infrared            HP/CP with/ without  IFC

## 2024-11-24 ENCOUNTER — Ambulatory Visit: Admitting: Physician Assistant

## 2024-12-05 NOTE — Progress Notes (Signed)
 [facility]                    PT Daily Progress Note  Patients Name:Jane [name]                        MRN #[id number]      Date:12/05/2024 Visit Number: 5   Progress Related to Long and Short Term Goals:                                                                                            []  ROM: []  Strength Pain -     while- [] at rest    [] During activity:  []  Functional goal:  where 0 = completely disabled, 100 equals full function.      []  Other: []  Balance:  Patient / Family Education:    []  Progressed HEP to include:  []  Reviewed previous HEP:   ___________________________________________________________________________________   Skilled Service Provided:  To: []  right   []  left     []  bilateral    []   core/body stability and balance  []  neck   []   shoulder   []  upper arm/  []  elbow   []  lower arm  []  wrist   []  hand   []   fingers  []  back     []  hip    []  upper leg    []  knee  []  lower leg   []   ankle  foot             Ther Ex           Manual Tx Manual Therapy CPT 97140: 30 minutes      E-Stim         Infrared     US  x min             Ice / Heat                Vaso-pneumaticcompress             []  ROM [] joint mob [] inflam Setting: [] Circ  [] pain [] swelling  [] Flex [] Flex [] pain [] Healing [] pain [] inflam [] edema  [] Bal []  ROM [] swelling [] pain [] swelling [] swelling Temp:         F  Strength       []   [] ASTYM [] circulation [] circulation [] inflam [] circulation [] soft tissue mob   Level      [] -1        [] -2      [] -3  Progressive     E  HEP    Exercise          []   [] tension/spasms/soft tissue Mob [] tissue mob       Posture        []   [] Correct malaign [] tissue mob        Settings:        Subjective:  No new complaints of pain  Treatment notes: Today she was tighter over the right paraspinals.  Tolerated STM and stretching well.  Patient to continue with stretching, deep breathing, heat and activity  modification.  Will add  core strengthening as soft tissue tone decreases.   Assessment of Treatment: continue as tolerated.  Patients Response to Treatment:  []  pain  []  pain    []   flexibility      []  endurance      []   strength     []   AROM      []   PROM       []   Spasms    []  Posture / alignment    []   Joint Mobility  []  Other:   Functional Improvement Noted:  Increased ability to  []  walk   []  stand for longer periods   []  dress with less help    []  lift     []   reach     []   bend    []  Other:  Remaining Impairment Requiring Continued Treatment:   []   flexibility   []   ROM   []   pain []  strength   []   weakness    []   balance   []   swelling   []   inflammation   []  Spasm  []  posture/alignment    []   Other:  Plan: []   Continue Plan of Care        []   Add:         []   Discharge after next visit    []   Discharge to HEP   []  Other:  PT Billing Documentation Visit Number: 5       Frequently Used Timed Codes Manual Therapy CPT 97140: 30 minutes                  Total Treatment Time: 30 minutes     Therapists : Randine LITTIE Muss, PT     Back Treatment Log   Date 11/17/24 11/20/24 12/22 12/29  1/2       Bike/Stepper (seat_____)            Treadmill            Leg Press            Wall Slides            Multi-Hip Flex            Multi-Hip Ext            Multi-Hip Abd            Multi-Hip Add            Knee Extension            Leg Curls            Standing shoulder flex            Standing shoulder abd            SLR            Marching            Bridges            Abdmonial Isometrics w/ball            Partial sit-ups            Pelvic Tilts            Prone Prop/ Prone Push Up            SKC/Piriformis/HAMS/QUADS 2x30           SI joint mobs/STM 10           Seated pelvic rocking 10x  Thoracic rotation stretch 2x30           Seated QL stretch 2x30           PROM and STM  25 25 25 25                                                         Ultrasound/ Infrared            HP/CP with/ without  IFC

## 2024-12-16 ENCOUNTER — Encounter: Payer: Self-pay | Admitting: Physician Assistant

## 2024-12-16 ENCOUNTER — Ambulatory Visit: Admitting: Physician Assistant

## 2024-12-16 VITALS — BP 138/82 | Ht 67.0 in | Wt 222.0 lb

## 2024-12-16 DIAGNOSIS — Z981 Arthrodesis status: Secondary | ICD-10-CM

## 2024-12-16 DIAGNOSIS — M545 Low back pain, unspecified: Secondary | ICD-10-CM

## 2024-12-16 DIAGNOSIS — Z96642 Presence of left artificial hip joint: Secondary | ICD-10-CM

## 2024-12-16 DIAGNOSIS — G8929 Other chronic pain: Secondary | ICD-10-CM | POA: Diagnosis not present

## 2024-12-16 MED ORDER — METHOCARBAMOL 500 MG PO TABS: 500.0000 mg | ORAL_TABLET | Freq: Three times a day (TID) | ORAL | 2 refills | Status: AC | PRN

## 2024-12-16 NOTE — Progress Notes (Signed)
 "  Referring Physician:  Franchot Houston, PA-C [address] [place],  [place] [address]  Primary Physician:  Franchot Houston, PA-C  History of Present Illness: 12/16/2024 [name] is here today with a chief complaint of low back and left hip pain.  Previously underwent a L3-5 decompression, L5-S1 TLIF, L3-S1 fusion with Dr. Katrina.  In addition also has undergone a left total hip arthroplasty.  Since she was last seen, she has been undergoing physical therapy and also taking a muscle relaxer.  She does feel as though physical therapy has helped her tremendously.  She is also doing home exercises that she has been given as well.  She continues to be in pain, but it is not as severe.    Weakness: none  Bowel/Bladder Dysfunction: none  Conservative measures:  Physical therapy:  Has not participated in recently? Multimodal medical therapy including regular antiinflammatories:  acetaminophen , tramadol , meloxicam , Hydrocodone -acetaminophen , gabapentin  Injections:   08/23/2022: Bilateral sacroiliac joint injections 07/24/2022: Left hip injection (60% relief) 05/30/2021: Right C5-6 transforaminal ESI (good relief for several days) 05/10/2021: Right subacromial bursa injection (80% relief) 04/04/2021: Bilateral S1 transforaminal ESI (40% relief)     Jane [name] has no symptoms of cervical myelopathy.  The symptoms are causing a significant impact on the patient's life.   Review of Systems:  A 10 point review of systems is negative, except for the pertinent positives and negatives detailed in the HPI.  Past Medical History: Past Medical History:  Diagnosis Date   Allergy    Anemia    as a child   Anxiety    Arthritis    Knees, feet, elbows , neck   Asthma    mild; assoc with seasonal allergies; uses Albuterol  once per year on average.   Atypical chest pain    hx    Complication of anesthesia    nausea, last 2 surgeries nausea much less    Depression    DM (diabetes mellitus), type 2 (HCC)    Fibromyalgia 12/04/2010   [facility] in [place]; Dr. Nat Flicker; followed every six months.   GERD (gastroesophageal reflux disease)    Headache    Heart murmur    Mitral valve Prolapse   HLD (hyperlipidemia)    Hypertension    Hypothyroidism post radioactive iodine   Hyperthyroidism; now secondary hypothyroidism   Lumbar degenerative disc disease    MR (mitral regurgitation)    moderate; probably MVP. echo 3/10 at [facility] cardiology with normal EF, moderate MR    Obesity    OSA on CPAP    Osteoarthritis of carpometacarpal joint    thumb   PONV (postoperative nausea and vomiting)    hard time waking   Pre-diabetes    Vitamin D  deficiency     Past Surgical History: Past Surgical History:  Procedure Laterality Date   APPLICATION OF WOUND VAC  10/05/2021   Procedure: APPLICATION OF WOUND VAC;  Surgeon: Clois Fret, MD;  Location: [facility] ORS;  Service: Neurosurgery;;  prevena plus wound vac    CARPOMETACARPAL [facility]) FUSION OF THUMB Left 05/31/2023   Procedure: CARPOMETACARPAL [facility]) FUSION OF THUMB;  Surgeon: Cleotilde Barrio, MD;  Location: [facility] ORS;  Service: Orthopedics;  Laterality: Left;   COLONOSCOPY  12/04/2009   normal; I[facility]har   COLONOSCOPY WITH PROPOFOL  N/A 08/09/2018   Procedure: COLONOSCOPY WITH PROPOFOL ;  Surgeon: Janalyn Keene NOVAK, MD;  Location: [facility] ENDOSCOPY;  Service: Endoscopy;  Laterality: N/A;   ESOPHAGOGASTRODUODENOSCOPY (EGD) WITH PROPOFOL  N/A  08/09/2018   Procedure: ESOPHAGOGASTRODUODENOSCOPY (EGD) WITH PROPOFOL ;  Surgeon: Janalyn Keene NOVAK, MD;  Location: [facility] ENDOSCOPY;  Service: Endoscopy;  Laterality: N/A;   JOINT REPLACEMENT     KNEE ARTHROPLASTY Right 10/25/2016   Procedure: COMPUTER ASSISTED TOTAL KNEE ARTHROPLASTY;  Surgeon: Lynwood SHAUNNA Hue, MD;  Location: [facility] ORS;  Service: Orthopedics;  Laterality: Right;   KNEE SURGERY  06/2009   LAPAROSCOPY  1984   for endomeriosis    left foot surgery     for plantar fascitis   LUMBAR LAMINECTOMY/DECOMPRESSION MICRODISCECTOMY N/A 10/05/2021   Procedure: L3-5 DECOMPRESSION;  Surgeon: Clois Fret, MD;  Location: [facility] ORS;  Service: Neurosurgery;  Laterality: N/A;   MAXIMUM ACCESS (MAS) TRANSFORAMINAL LUMBAR INTERBODY FUSION (TLIF) 1 LEVEL N/A 10/05/2021   Procedure: L5-S1 TRANSFORAMINAL LUMBAR INTERBODY FUSION (TLIF);  Surgeon: Clois Fret, MD;  Location: [facility] ORS;  Service: Neurosurgery;  Laterality: N/A;   novascue uterine ablation  2007   REPLACEMENT TOTAL KNEE Left    TOTAL HIP ARTHROPLASTY Left 01/08/2023   Procedure: TOTAL HIP ARTHROPLASTY ANTERIOR APPROACH;  Surgeon: Lorelle Hussar, MD;  Location: [facility] ORS;  Service: Orthopedics;  Laterality: Left;    Allergies: Allergies as of 12/16/2024 - Review Complete 12/16/2024  Allergen Reaction Noted   Celebrex [celecoxib] Swelling and Shortness Of Breath 10/20/2012   Atorvastatin  Other (See Comments) 04/26/2020    Medications: Outpatient Encounter Medications as of 12/16/2024  Medication Sig   clonazePAM  (KLONOPIN ) 0.5 MG tablet Take 0.5-1 tablets (0.25-0.5 mg total) by mouth 2 (two) times daily as needed for anxiety. (Patient taking differently: Take 0.25 mg by mouth 2 (two) times daily as needed for anxiety.)   gabapentin  (NEURONTIN ) 400 MG capsule Take 1 capsule (400 mg total) by mouth 3 (three) times daily.   hydrochlorothiazide  (MICROZIDE ) 12.5 MG capsule Take 12.5 mg by mouth every morning.   levothyroxine  (SYNTHROID ) 112 MCG tablet TAKE 1 TABLET BY MOUTH DAILY 30 MINUTES BEFORE BREAKFAST (Patient taking differently: Take 112 mcg by mouth daily before breakfast. TAKE 1 TABLET BY MOUTH DAILY 30 MINUTES BEFORE BREAKFAST)   losartan  (COZAAR ) 100 MG tablet TAKE 1 TABLET BY MOUTH DAILY (Patient taking differently: Take 100 mg by mouth at bedtime.)   meloxicam  (MOBIC ) 15 MG tablet Take 1 tablet (15 mg total) by mouth daily.   montelukast  (SINGULAIR ) 10 MG  tablet TAKE 1 TABLET AT BEDTIME (Patient taking differently: Take 10 mg by mouth at bedtime.)   omeprazole  (PRILOSEC) 20 MG capsule TAKE 1 CAPSULE BY MOUTH TWICE DAILY BEFORE MEALS (Patient taking differently: 20 mg every morning. And prn)   rosuvastatin  (CRESTOR ) 10 MG tablet Take 10 mg by mouth at bedtime.   tiZANidine  (ZANAFLEX ) 4 MG tablet TAKE 1 TABLET BY MOUTH 3 TIMES DAILY.   urea (CARMOL) 40 % CREA Apply topically.   venlafaxine  XR (EFFEXOR -XR) 150 MG 24 hr capsule TAKE 1 CAPSULE BY MOUTH DAILY WITH BREAKFAST. (Patient taking differently: 150 mg daily with breakfast.)   [DISCONTINUED] acetaminophen  (TYLENOL ) 500 MG tablet Take 2 tablets (1,000 mg total) by mouth every 8 (eight) hours.   [DISCONTINUED] ondansetron  (ZOFRAN ) 4 MG tablet Take 1 tablet (4 mg total) by mouth every 6 (six) hours as needed for nausea.   [DISCONTINUED] Semaglutide, 1 MG/DOSE, (OZEMPIC, 1 MG/DOSE,) 2 MG/1.5ML SOPN Inject 1 mg into the skin once a week. Fridays   [DISCONTINUED] sertraline (ZOLOFT) 50 MG tablet Take 50 mg by mouth daily.   [DISCONTINUED] traZODone (DESYREL) 50 MG tablet Take 50 mg by mouth at bedtime.  No facility-administered encounter medications on file as of 12/16/2024.    Social History: Social History   Tobacco Use   Smoking status: Former    Current packs/day: 0.00    Average packs/day: 2.0 packs/day for 15.0 years (30.0 ttl pk-yrs)    Types: Cigarettes    Start date: 10/13/1979    Quit date: 10/12/1994    Years since quitting: 30.2   Smokeless tobacco: Never   Tobacco comments:    smoked 2ppd for 15 years; quit in 1995   Vaping Use   Vaping status: Never Used  Substance Use Topics   Alcohol use: Yes    Alcohol/week: 0.0 standard drinks of alcohol    Comment: social, rare   Drug use: No    Family Medical History: Family History  Problem Relation Age of Onset   Stroke Father 54       CVA   COPD Father    Hyperlipidemia Mother    Heart disease Mother 20       CABG in 22s;  AMI age 1   Hypertension Mother    Hypertension Sister    Breast cancer Paternal Aunt 70    Physical Examination: @VITALWITHPAIN @  General: Patient is well developed, well nourished, calm, collected, and in no apparent distress. Attention to examination is appropriate.  Psychiatric: Patient is non-anxious.  Head:  Pupils equal, round, and reactive to light.  ENT:  Oral mucosa appears well hydrated.  Neck:   Supple.  Full range of motion.  Respiratory: Patient is breathing without any difficulty.  Extremities: No edema.  Vascular: Palpable dorsal pedal pulses.  Skin:   On exposed skin, there are no abnormal skin lesions.  NEUROLOGICAL:     Awake, alert, oriented to person, place, and time.  Speech is clear and fluent. Fund of knowledge is appropriate.   Cranial Nerves: Pupils equal round and reactive to light.  Facial tone is symmetric.   ROM of spine:  TTP of lumbar paraspinals.  Continues to be tender to palpation of her lateral hip area bilaterally.  Strength:  Side Iliopsoas Quads Hamstring PF DF EHL  R 5 5 5 5 5 5   L 5 5 5 5 5 5    Reflexes are 2+ in bilateral patella, hard to obtain hamstring flex.  Trace Achilles bilaterally. Clonus is not present.  Toes are down-going.  Bilateral upper and lower extremity sensation is intact to light touch.  Some chronic decreased sensation of bilateral feet   Medical Decision Making  Imaging: EXAM: MRI LUMBAR SPINE 10/20/2024 07:09:32 AM   TECHNIQUE: Multiplanar multisequence MRI of the lumbar spine was performed without the administration of intravenous contrast.   COMPARISON: MR Lumbar Spine 07/18/2022.   CLINICAL HISTORY: Low back and hip pain radiating into anterior thigh with weakness, numbness and tingling, status post of decompression.   FINDINGS:   BONES AND ALIGNMENT: Normal alignment. Normal vertebral body heights. Background marrow signal is unremarkable unless otherwise stated. Posterolateral rod  and pedicle screw fixators bilaterally at L3-L4-L5-S1. Interbody spacer at the L5-S1 level. 4 mm degenerative anterolisthesis at L5-S1. The lowest lumbar type non-weightbearing vertebra is labeled as L5.   SPINAL CORD: Normal appearing conus medullaris terminates at the T12-L1 level.   SOFT TISSUES: No paraspinal mass.   T12-L1: Unremarkable.   L1-L2: No impingement. Bilateral degenerative facet arthropathy.   L2-L3: Moderate central stenosis due to disc bulge and facet arthropathy, worse than previous. Borderline bilateral subarticular lateral recess stenosis.   L3-L4: No impingement. There  appears to still be disc material at L3-L4.   L4-L5: No impingement. There appears to still be disc material at L4-L5.   L5-S1: Borderline left foraminal stenosis due to intervertebral spurring.   IMPRESSION: 1. Moderate central stenosis and borderline bilateral subarticular lateral recess stenosis at L2-L3 due to disc bulge and facet arthropathy, worse than previous. 2. Borderline left foraminal stenosis at L5-S1 due to intervertebral spurring. 3. 4 mm degenerative anterolisthesis at L5-S1.    I have personally reviewed the images and agree with the above interpretation.  Assessment and Plan: [name] is a pleasant 64 y.o. female who previously underwent a L3-5 decompression, L5-S1 TLIF, L3-S1 fusion with Dr. Katrina.  In addition also has undergone a left total hip arthroplasty and comes in today for follow-up on low back and bilateral hip pain.  No deficits on examination.  Thankfully she is improving significantly with physical therapy.   We discussed today moving forward.  Would like her to continue with physical therapy and continuing home exercises.  She would like to try different muscle relaxer to try to have more significant relief.  Will trial Robaxin .  The side effects this medication were discussed at length.  She agrees to continue.  I also discussed with her  optimizing over-the-counter lidocaine /Voltaren gel as well as heat and ice.  Plan for her to follow-up with us  on an as needed basis.  Thank you for involving me in the care of this patient.    Lyle Decamp, PA-C Dept. of Neurosurgery  "

## 2024-12-24 ENCOUNTER — Ambulatory Visit: Admitting: Physician Assistant
